# Patient Record
Sex: Male | Born: 1965
Health system: Southern US, Community
[De-identification: ages and names within clinical notes are randomized; demographics above are authoritative.]

## PROBLEM LIST (undated history)

## (undated) DIAGNOSIS — I10 Essential (primary) hypertension: Secondary | ICD-10-CM

## (undated) DIAGNOSIS — J45909 Unspecified asthma, uncomplicated: Secondary | ICD-10-CM

## (undated) HISTORY — PX: LACERATION REPAIR: SHX5168

---

## 2002-05-04 ENCOUNTER — Encounter: Payer: Self-pay | Admitting: Emergency Medicine

## 2002-05-04 ENCOUNTER — Emergency Department (HOSPITAL_COMMUNITY): Admission: EM | Admit: 2002-05-04 | Discharge: 2002-05-04 | Payer: Self-pay | Admitting: Emergency Medicine

## 2007-03-14 ENCOUNTER — Ambulatory Visit (HOSPITAL_COMMUNITY): Admission: RE | Admit: 2007-03-14 | Discharge: 2007-03-14 | Payer: Self-pay | Admitting: Family Medicine

## 2007-04-03 ENCOUNTER — Ambulatory Visit (HOSPITAL_COMMUNITY): Admission: RE | Admit: 2007-04-03 | Discharge: 2007-04-03 | Payer: Self-pay | Admitting: Family Medicine

## 2008-06-22 IMAGING — US US ABDOMEN COMPLETE
1 series · 14 of 25 positions shown · non-contrast
Comparison: none

HISTORY: Elevated LFTs

ULTRASOUND ABDOMEN COMPLETE:
TECHNIQUE: Complete abdominal ultrasound examination was performed including
evaluation of the liver, gallbladder, bile ducts, pancreas, kidneys, spleen,
IVC, and abdominal aorta.
Gallbladder normal without stones or wall thickening.
Common bile duct normal caliber, 3 mm diameter.
Liver, pancreas, and spleen normal appearance, spleen 6.6 cm length.
Kidneys normal appearance, right 10.4 cm length and left 10.6 cm.
Aorta and IVC normal.
No ascites.

[Series 1: us abdomen complete · 0.20mm/px · 14 of 78 slices shown]
[im 1/78]
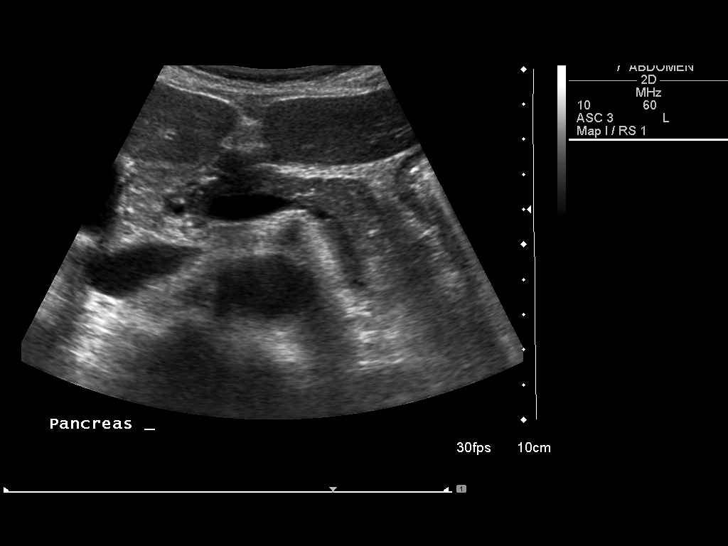
[im 7/78]
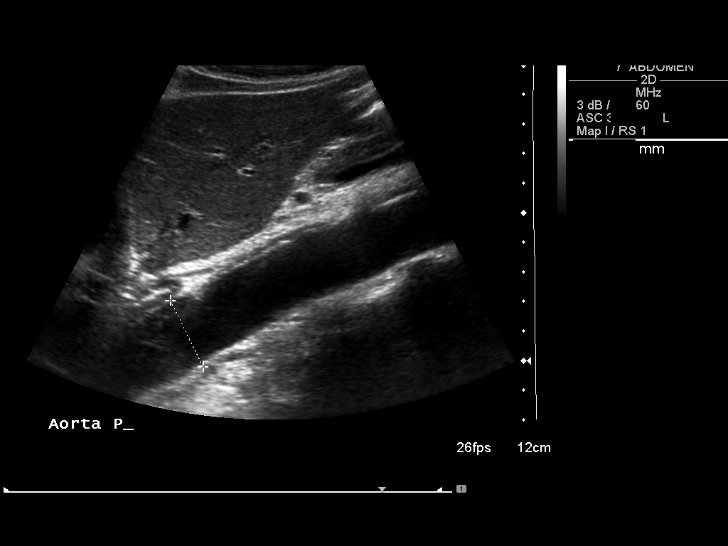
[im 13/78]
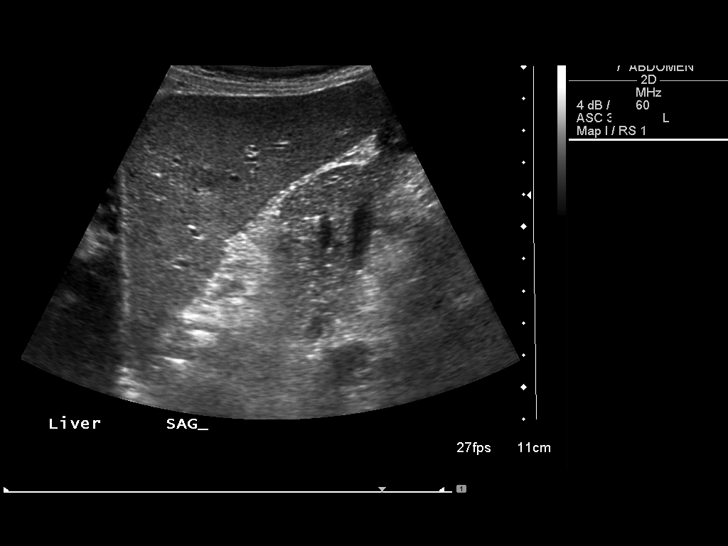
[im 20/78]
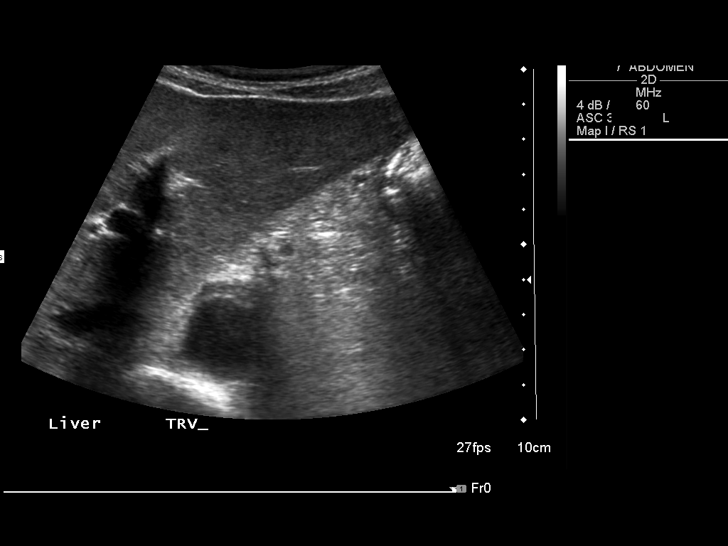
[im 26/78]
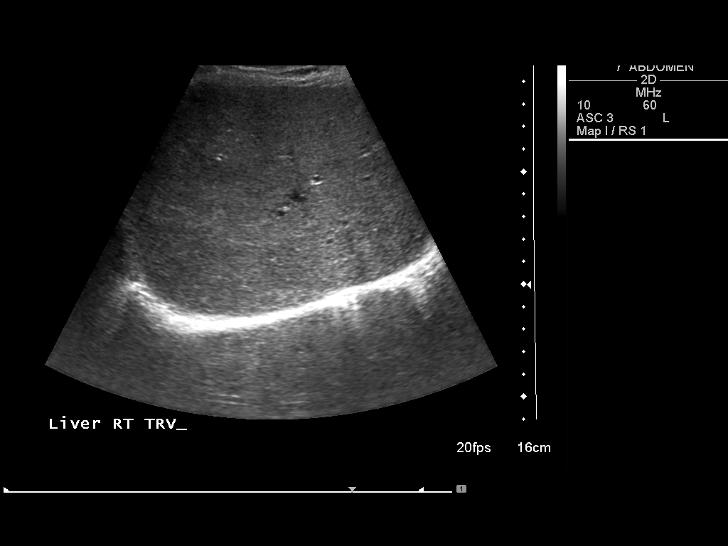
[im 29/78]
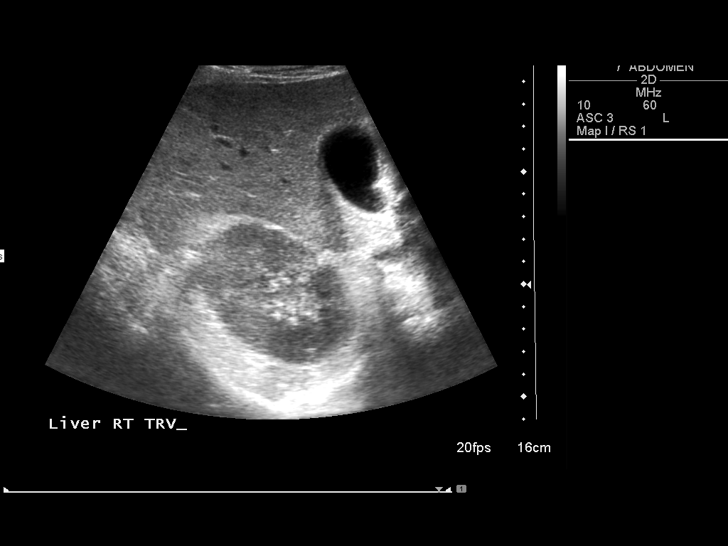
[im 36/78]
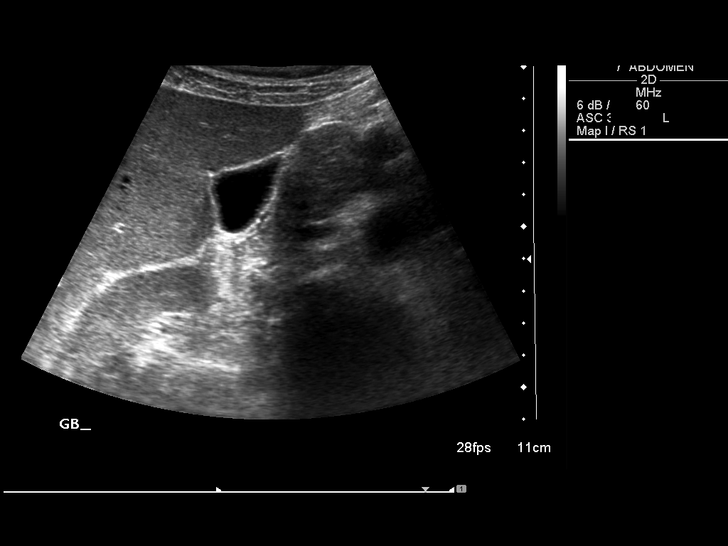
[im 42/78]
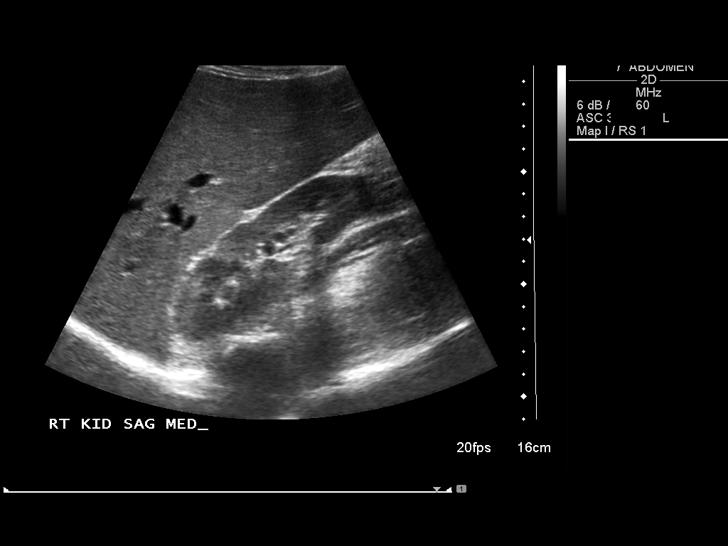
[im 49/78]
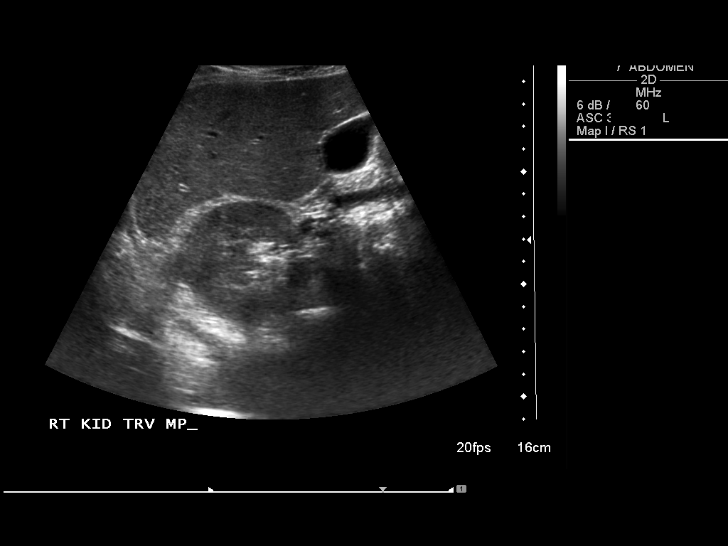
[im 52/78]
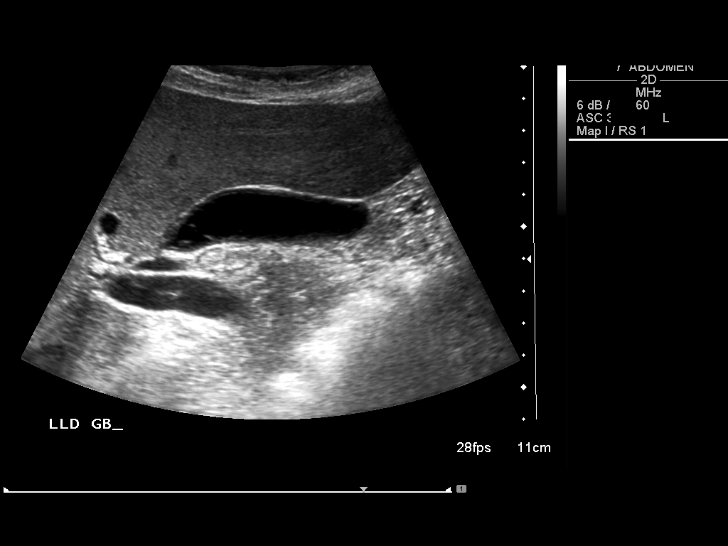
[im 58/78]
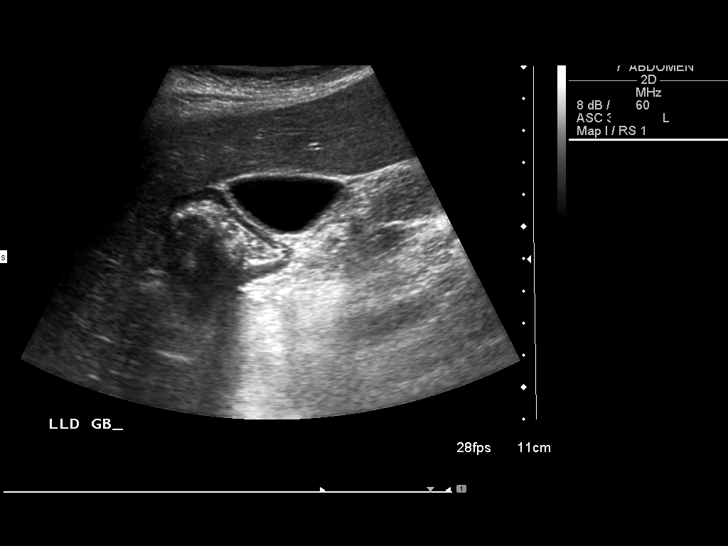
[im 65/78]
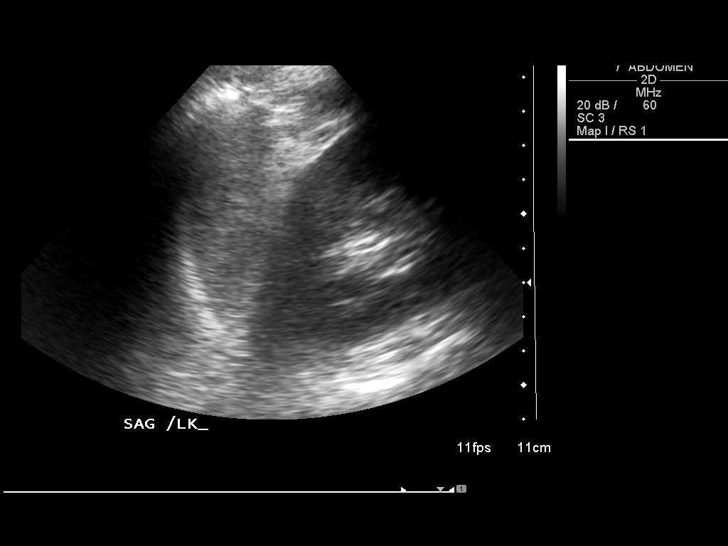
[im 71/78]
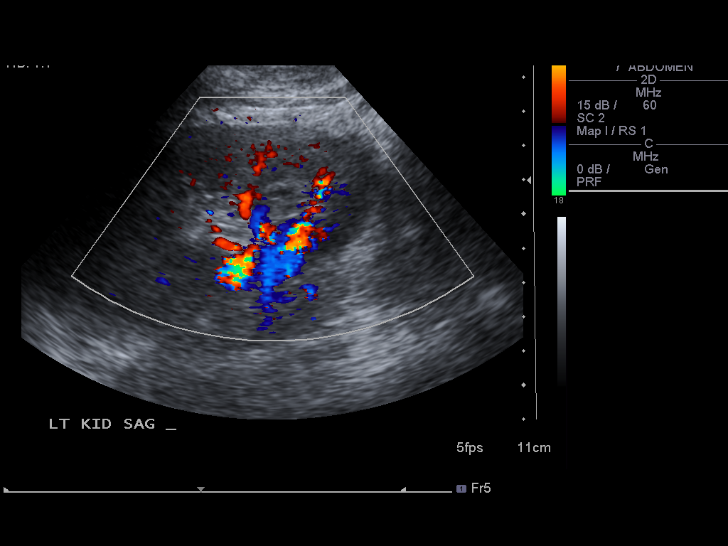
[im 78/78]
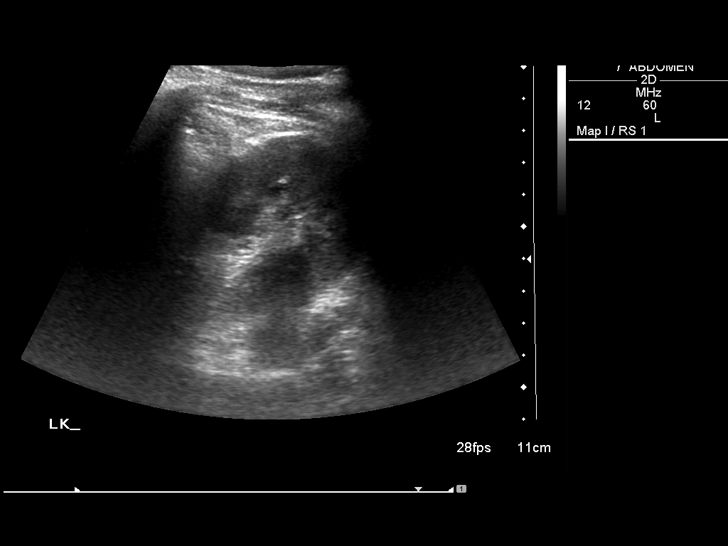

[14 of 25 positions shown; findings below may reference images not displayed]

IMPRESSION: Normal ultrasound abdomen.

## 2009-10-10 ENCOUNTER — Emergency Department (HOSPITAL_COMMUNITY): Admission: EM | Admit: 2009-10-10 | Discharge: 2009-10-10 | Payer: Self-pay | Admitting: Emergency Medicine

## 2014-09-13 ENCOUNTER — Emergency Department (HOSPITAL_COMMUNITY)
Admission: EM | Admit: 2014-09-13 | Discharge: 2014-09-13 | Disposition: A | Discharge status: Home / Self Care | Payer: Self-pay | Attending: Emergency Medicine | Admitting: Emergency Medicine

## 2014-09-13 ENCOUNTER — Encounter (HOSPITAL_COMMUNITY): Payer: Self-pay | Admitting: Emergency Medicine

## 2014-09-13 DIAGNOSIS — I889 Nonspecific lymphadenitis, unspecified: Secondary | ICD-10-CM | POA: Insufficient documentation

## 2014-09-13 DIAGNOSIS — K047 Periapical abscess without sinus: Secondary | ICD-10-CM | POA: Insufficient documentation

## 2014-09-13 DIAGNOSIS — Z720 Tobacco use: Secondary | ICD-10-CM | POA: Insufficient documentation

## 2014-09-13 DIAGNOSIS — R51 Headache: Secondary | ICD-10-CM | POA: Insufficient documentation

## 2014-09-13 DIAGNOSIS — H9209 Otalgia, unspecified ear: Secondary | ICD-10-CM | POA: Insufficient documentation

## 2014-09-13 MED ORDER — OXYCODONE-ACETAMINOPHEN 5-325 MG PO TABS
1.0000 | ORAL_TABLET | Freq: Once | ORAL | Status: AC
  Administered 2014-09-13: 1 via ORAL
  Filled 2014-09-13: qty 1

## 2014-09-13 MED ORDER — HYDROCODONE-ACETAMINOPHEN 5-325 MG PO TABS: 1.0000 | ORAL_TABLET | ORAL | Status: DC | PRN

## 2014-09-13 MED ORDER — AMOXICILLIN 500 MG PO CAPS: 500.0000 mg | ORAL_CAPSULE | Freq: Three times a day (TID) | ORAL | Status: DC

## 2014-09-13 MED ORDER — AMOXICILLIN 250 MG PO CAPS
500.0000 mg | ORAL_CAPSULE | Freq: Once | ORAL | Status: AC
  Administered 2014-09-13: 500 mg via ORAL
  Filled 2014-09-13: qty 2

## 2014-09-13 NOTE — ED Provider Notes (Signed)
CSN: [id number]     Arrival date & time 09/13/14  1652 History  This chart was scribed for non-physician practitioner Kerrie BuffaloHope Neese, NP, working with Benjiman CoreNathan Pickering, MD by Littie Deedsichard Sun, ED Scribe. This patient was seen in room APFT24/APFT24 and the patient's care was started at 5:53 PM.      Chief Complaint  Patient presents with  . Dental Pain   Patient is a 49 y.o. male presenting with tooth pain. The history is provided by the patient. No language interpreter was used.  Dental Pain Location:  Lower Lower teeth location:  19/LL 1st molar Quality:  Throbbing and constant Onset quality:  Gradual Duration:  3 days Progression:  Worsening Chronicity:  New Context: abscess   Relieved by:  Nothing Worsened by:  Cold food/drink, pressure and jaw movement Ineffective treatments:  Acetaminophen Associated symptoms: headaches   Risk factors: smoking    HPI Comments: Colton Nichols is a 49 y.o. male who presents to the Emergency Department complaining of gradual onset left lower dental pain in his 1st molar that started 3 days ago. Patient also reports having associated headache, ear pain, and pain when swallowing. He has been taking ibuprofen and Tylenol for the pain but without relief. He believes the tooth is abscessed and notes gum soreness around the tooth. NKDA.   History reviewed. No pertinent past medical history. History reviewed. No pertinent past surgical history. History reviewed. No pertinent family history. History  Substance Use Topics  . Smoking status: Current Every Day Smoker -- 0.50 packs/day    Types: Cigarettes  . Smokeless tobacco: Former NeurosurgeonUser  . Alcohol Use: Yes     Comment: 40 oz daily    Review of Systems  HENT: Positive for dental problem and ear pain.   Neurological: Positive for headaches.  all other systems negative    Allergies  Review of patient's allergies indicates no known allergies.  Home Medications   Prior to Admission medications    Medication Sig Start Date End Date Taking? Authorizing Provider  amoxicillin (AMOXIL) 500 MG capsule Take 1 capsule (500 mg total) by mouth 3 (three) times daily. 09/13/14   Hope Orlene OchM Neese, NP  HYDROcodone-acetaminophen (NORCO/VICODIN) 5-325 MG per tablet Take 1 tablet by mouth every 4 (four) hours as needed. 09/13/14   Hope Orlene OchM Neese, NP   BP 162/91 mmHg  Pulse 88  Temp(Src) 99 F (37.2 C) (Oral)  Resp 16  Ht 6\' 1"  (1.854 m)  Wt 155 lb (70.308 kg)  BMI 20.45 kg/m2  SpO2 99% Physical Exam  Constitutional: He is oriented to person, place, and time. He appears well-developed and well-nourished. No distress.  HENT:  Right Ear: Tympanic membrane normal.  Left Ear: Tympanic membrane normal.  Nose: Nose normal.  Mouth/Throat: Uvula is midline, oropharynx is clear and moist and mucous membranes are normal.    Gum surrounding the left lower first molar with erythema and swelling, tender on exam.   Eyes: Conjunctivae and EOM are normal.  Neck: Neck supple.  Lymphadenitis on the left side.  Cardiovascular: Normal rate.   Pulmonary/Chest: Effort normal.  Musculoskeletal: Normal range of motion.  Lymphadenopathy:    He has cervical adenopathy (left).  Neurological: He is alert and oriented to person, place, and time. No cranial nerve deficit.  Skin: Skin is warm and dry.  Psychiatric: He has a normal mood and affect. His behavior is normal.  Nursing note and vitals reviewed.   ED Course  Procedures  Percocet 5/325 mg PO,  Amoxicillin 500 mg PO DIAGNOSTIC STUDIES: Oxygen Saturation is 99% on room air, normal by my interpretation.    COORDINATION OF CARE: 5:56 PM-Discussed treatment plan which includes antibiotics and pain medication with patient/guardian at bedside and patient agreed to plan.     MDM  49 y.o. male with dental pain due to abscess. Will treat with antibiotics and pain medication and he will follow up with a dentist as soon as possible. Discussed with the patient and all  questioned fully answered. Stable for d/c without fever and does not appear toxic.   Final diagnoses:  Dental abscess   I personally performed the services described in this documentation, which was scribed in my presence. The recorded information has been reviewed and is accurate.   [place], NP 09/13/14 1840  Benjiman Core, MD 09/15/14 [phone]

## 2014-09-13 NOTE — ED Notes (Signed)
Pt reports L sided dental pain that began on Fri.

## 2014-09-13 NOTE — Discharge Instructions (Signed)
Continue to take the ibuprofen. Do not take the narcotic if driving as it will make you sleepy.  Follow up with a dentist as soon as possible.   Dental Abscess A dental abscess is a collection of infected fluid (pus) from a bacterial infection in the inner part of the tooth (pulp). It usually occurs at the end of the tooth's root.  CAUSES   Severe tooth decay.  Trauma to the tooth that allows bacteria to enter into the pulp, such as a broken or chipped tooth. SYMPTOMS   Severe pain in and around the infected tooth.  Swelling and redness around the abscessed tooth or in the mouth or face.  Tenderness.  Pus drainage.  Bad breath.  Bitter taste in the mouth.  Difficulty swallowing.  Difficulty opening the mouth.  Nausea.  Vomiting.  Chills.  Swollen neck glands. DIAGNOSIS   A medical and dental history will be taken.  An examination will be performed by tapping on the abscessed tooth.  X-rays may be taken of the tooth to identify the abscess. TREATMENT The goal of treatment is to eliminate the infection. You may be prescribed antibiotic medicine to stop the infection from spreading. A root canal may be performed to save the tooth. If the tooth cannot be saved, it may be pulled (extracted) and the abscess may be drained.  HOME CARE INSTRUCTIONS  Only take over-the-counter or prescription medicines for pain, fever, or discomfort as directed by your caregiver.  Rinse your mouth (gargle) often with salt water ( tsp salt in 8 oz [250 ml] of warm water) to relieve pain or swelling.  Do not drive after taking pain medicine (narcotics).  Do not apply heat to the outside of your face.  Return to your dentist for further treatment as directed. SEEK MEDICAL CARE IF:  Your pain is not helped by medicine.  Your pain is getting worse instead of better. SEEK IMMEDIATE MEDICAL CARE IF:  You have a fever or persistent symptoms for more than 2-3 days.  You have a fever and  your symptoms suddenly get worse.  You have chills or a very bad headache.  You have problems breathing or swallowing.  You have trouble opening your mouth.  You have swelling in the neck or around the eye. Document Released: 05/07/2005 Document Revised: 01/30/2012 Document Reviewed: 08/15/2010 [facility] Patient Information 2015 [place], [place]. This information is not intended to replace advice given to you by your health care provider. Make sure you discuss any questions you have with your health care provider.

## 2014-09-17 ENCOUNTER — Emergency Department (HOSPITAL_COMMUNITY)
Admission: EM | Admit: 2014-09-17 | Discharge: 2014-09-17 | Disposition: A | Discharge status: Home / Self Care | Payer: Self-pay | Attending: Emergency Medicine | Admitting: Emergency Medicine

## 2014-09-17 ENCOUNTER — Encounter (HOSPITAL_COMMUNITY): Payer: Self-pay | Admitting: *Deleted

## 2014-09-17 DIAGNOSIS — K029 Dental caries, unspecified: Secondary | ICD-10-CM | POA: Insufficient documentation

## 2014-09-17 DIAGNOSIS — Z720 Tobacco use: Secondary | ICD-10-CM | POA: Insufficient documentation

## 2014-09-17 DIAGNOSIS — K047 Periapical abscess without sinus: Secondary | ICD-10-CM | POA: Insufficient documentation

## 2014-09-17 MED ORDER — CLINDAMYCIN HCL 150 MG PO CAPS: 300.0000 mg | ORAL_CAPSULE | Freq: Four times a day (QID) | ORAL | Status: DC

## 2014-09-17 MED ORDER — HYDROCODONE-ACETAMINOPHEN 5-325 MG PO TABS: ORAL_TABLET | ORAL | Status: DC

## 2014-09-17 MED ORDER — NAPROXEN 500 MG PO TABS: 500.0000 mg | ORAL_TABLET | Freq: Two times a day (BID) | ORAL | Status: DC

## 2014-09-17 MED ORDER — OXYCODONE-ACETAMINOPHEN 5-325 MG PO TABS
1.0000 | ORAL_TABLET | Freq: Once | ORAL | Status: AC
  Administered 2014-09-17: 1 via ORAL
  Filled 2014-09-17: qty 1

## 2014-09-17 MED ORDER — BENZOCAINE (TOPICAL) 20 % EX AERO
INHALATION_SPRAY | Freq: Four times a day (QID) | CUTANEOUS | Status: DC | PRN
  Administered 2014-09-17: 13:00:00 via OROMUCOSAL
  Filled 2014-09-17: qty 57

## 2014-09-17 MED ORDER — CLINDAMYCIN HCL 150 MG PO CAPS
300.0000 mg | ORAL_CAPSULE | Freq: Once | ORAL | Status: AC
  Administered 2014-09-17: 300 mg via ORAL
  Filled 2014-09-17: qty 2

## 2014-09-17 NOTE — ED Provider Notes (Signed)
CSN: [id number]     Arrival date & time 09/17/14  1028 History   First MD Initiated Contact with Patient 09/17/14 1119     Chief Complaint  Patient presents with  . Oral Swelling     (Consider location/radiation/quality/duration/timing/severity/associated sxs/prior Treatment) HPI   Colton Nichols is a 49 y.o. male who presents to the Emergency Department complaining of dental pain and left lower facial swelling.  He was seen here 5 days ago for same and returns today reporting increased pain and swelling of his face.  He states that he has been taking amoxil and vicodin without relief.  He has been unable to see a dentist.  Pain is worse with chewing and cold liquids.  He denies fever, difficulty swallowing, breathing and inability to open/close his mouth.     History reviewed. No pertinent past medical history. History reviewed. No pertinent past surgical history. History reviewed. No pertinent family history. History  Substance Use Topics  . Smoking status: Current Every Day Smoker -- 0.50 packs/day    Types: Cigarettes  . Smokeless tobacco: Former NeurosurgeonUser  . Alcohol Use: Yes     Comment: 40 oz daily    Review of Systems  Constitutional: Negative for fever and appetite change.  HENT: Positive for dental problem and facial swelling. Negative for congestion, sore throat and trouble swallowing.   Eyes: Negative for pain and visual disturbance.  Gastrointestinal: Negative for nausea and vomiting.  Musculoskeletal: Negative for neck pain and neck stiffness.  Skin: Negative for rash.  Neurological: Negative for dizziness, facial asymmetry and headaches.  Hematological: Negative for adenopathy.  All other systems reviewed and are negative.     Allergies  Review of patient's allergies indicates no known allergies.  Home Medications   Prior to Admission medications   Medication Sig Start Date End Date Taking? Authorizing Provider  amoxicillin (AMOXIL) 500 MG capsule Take 1  capsule (500 mg total) by mouth 3 (three) times daily. 09/13/14  Yes Hope Orlene OchM Neese, NP  HYDROcodone-acetaminophen (NORCO/VICODIN) 5-325 MG per tablet Take 1 tablet by mouth every 4 (four) hours as needed. 09/13/14  Yes Hope Orlene OchM Neese, NP   BP 140/87 mmHg  Pulse 75  Temp(Src) 98.3 F (36.8 C) (Oral)  Resp 19  Ht 6\' 1"  (1.854 m)  Wt 135 lb (61.236 kg)  BMI 17.82 kg/m2  SpO2 100% Physical Exam  Constitutional: He is oriented to person, place, and time. He appears well-developed and well-nourished. No distress.  HENT:  Head: Normocephalic and atraumatic.  Right Ear: Tympanic membrane and ear canal normal.  Left Ear: Tympanic membrane and ear canal normal.  Mouth/Throat: Uvula is midline, oropharynx is clear and moist and mucous membranes are normal. No trismus in the jaw. Dental caries present. No dental abscesses or uvula swelling.  Tenderness and dental caries of the left lower second premolar and first molar.  Fluctuance noted to the adjacent gums. Localized facial swelling, obvious dental abscess, no trismus, or sublingual abnml.    Neck: Normal range of motion. Neck supple.  Cardiovascular: Normal rate, regular rhythm and normal heart sounds.   No murmur heard. Pulmonary/Chest: Effort normal and breath sounds normal. No respiratory distress.  Musculoskeletal: Normal range of motion.  Lymphadenopathy:    He has no cervical adenopathy.  Neurological: He is alert and oriented to person, place, and time. He exhibits normal muscle tone. Coordination normal.  Skin: Skin is warm and dry.  Nursing note and vitals reviewed.   ED Course  Procedures (  including critical care time) Labs Review Labs Reviewed - No data to display  Imaging Review No results found.   EKG Interpretation None       INCISION AND DRAINAGE Performed by: Maxwell Caul. Consent: Verbal consent obtained. Risks and benefits: risks, benefits and alternatives were discussed Type: abscess  Body area: left lower  gums Anesthesia: topical  infiltration  Incision was made with a #11 scalpel.  Local anesthetic: topical benzocaine spray   Complexity: simple Blunt dissection to break up loculations  Drainage: purulent  Drainage amount: large Packing material: none  Patient tolerance: Patient tolerated the procedure well with no immediate complications.    MDM   Final diagnoses:  Dental abscess    Pt is well appearing, non-toxic.  Vitals stable.  Feeling better after I&D of abscess.  Will d/c the amoxil and Rx for clindamycin.  Pt agrees to arrange dental f/u next week.     Pauline Aus, PA-C 09/19/14 1610  Gilda Crease, MD 09/21/14 [phone]

## 2014-09-17 NOTE — Discharge Instructions (Signed)

## 2014-09-17 NOTE — ED Notes (Signed)
Pt returning for oral swelling, lt lower jaw, pt has been treated with antibiotics, swelling worse and moving down his neck. Pt was seen 5 days ago.

## 2015-07-03 ENCOUNTER — Encounter (HOSPITAL_COMMUNITY): Payer: Self-pay | Admitting: Emergency Medicine

## 2015-07-03 ENCOUNTER — Emergency Department (HOSPITAL_COMMUNITY)
Admission: EM | Admit: 2015-07-03 | Discharge: 2015-07-03 | Disposition: A | Discharge status: Home / Self Care | Payer: Self-pay | Attending: Emergency Medicine | Admitting: Emergency Medicine

## 2015-07-03 ENCOUNTER — Emergency Department (HOSPITAL_COMMUNITY): Payer: Self-pay

## 2015-07-03 DIAGNOSIS — F17210 Nicotine dependence, cigarettes, uncomplicated: Secondary | ICD-10-CM | POA: Insufficient documentation

## 2015-07-03 DIAGNOSIS — M5441 Lumbago with sciatica, right side: Secondary | ICD-10-CM

## 2015-07-03 DIAGNOSIS — Z791 Long term (current) use of non-steroidal anti-inflammatories (NSAID): Secondary | ICD-10-CM | POA: Insufficient documentation

## 2015-07-03 DIAGNOSIS — Y9389 Activity, other specified: Secondary | ICD-10-CM | POA: Insufficient documentation

## 2015-07-03 DIAGNOSIS — S3992XA Unspecified injury of lower back, initial encounter: Secondary | ICD-10-CM | POA: Insufficient documentation

## 2015-07-03 DIAGNOSIS — Z792 Long term (current) use of antibiotics: Secondary | ICD-10-CM | POA: Insufficient documentation

## 2015-07-03 DIAGNOSIS — W010XXA Fall on same level from slipping, tripping and stumbling without subsequent striking against object, initial encounter: Secondary | ICD-10-CM | POA: Insufficient documentation

## 2015-07-03 DIAGNOSIS — M5442 Lumbago with sciatica, left side: Secondary | ICD-10-CM

## 2015-07-03 DIAGNOSIS — M5440 Lumbago with sciatica, unspecified side: Secondary | ICD-10-CM | POA: Insufficient documentation

## 2015-07-03 DIAGNOSIS — Y9289 Other specified places as the place of occurrence of the external cause: Secondary | ICD-10-CM | POA: Insufficient documentation

## 2015-07-03 DIAGNOSIS — Y998 Other external cause status: Secondary | ICD-10-CM | POA: Insufficient documentation

## 2015-07-03 MED ORDER — OXYCODONE-ACETAMINOPHEN 5-325 MG PO TABS
1.0000 | ORAL_TABLET | Freq: Once | ORAL | Status: AC
  Administered 2015-07-03: 1 via ORAL
  Filled 2015-07-03: qty 1

## 2015-07-03 MED ORDER — NAPROXEN 500 MG PO TABS: 500.0000 mg | ORAL_TABLET | Freq: Two times a day (BID) | ORAL | Status: DC

## 2015-07-03 MED ORDER — CYCLOBENZAPRINE HCL 10 MG PO TABS
10.0000 mg | ORAL_TABLET | Freq: Once | ORAL | Status: AC
  Administered 2015-07-03: 10 mg via ORAL
  Filled 2015-07-03: qty 1

## 2015-07-03 MED ORDER — HYDROCODONE-ACETAMINOPHEN 5-325 MG PO TABS: ORAL_TABLET | ORAL | Status: DC

## 2015-07-03 NOTE — ED Notes (Signed)
Patient c/o low back and buttock pain. Per patient started Friday after "lifting box in tight spot and twisting the wrong way causing pain." Per patient pain caused him to drop the box and back up into metal self hitting his buttock on shelving.

## 2015-07-03 NOTE — ED Provider Notes (Signed)
CSN: [id number]     Arrival date & time 07/03/15  1454 History   First MD Initiated Contact with Patient 07/03/15 1559     Chief Complaint  Patient presents with  . Back Pain     (Consider location/radiation/quality/duration/timing/severity/associated sxs/prior Treatment) HPI   Colton Nichols is a 50 y.o. male who presents to the Emergency Department complaining of sudden onset of low back pain since yesterday.  He states that he was lifting a box and turned to set the box down  And felt a sharp, sudden pain to his lower back which caused him to fall landing on his buttocks.  He complains of worsening pain to his back with bending sitting and walking and pain radiating into both upper legs.  He took one of his significant other's vicodin last evening with some relief.  He denies head injury, neck pain, urine or bowel changes, numbness or weakness of the LE's.   History reviewed. No pertinent past medical history. History reviewed. No pertinent past surgical history. History reviewed. No pertinent family history. Social History  Substance Use Topics  . Smoking status: Current Every Day Smoker -- 0.50 packs/day    Types: Cigarettes  . Smokeless tobacco: Never Used  . Alcohol Use: Yes     Comment: occasional    Review of Systems  Constitutional: Negative for fever.  Respiratory: Negative for shortness of breath.   Gastrointestinal: Negative for vomiting, abdominal pain, diarrhea and constipation.  Genitourinary: Negative for dysuria, hematuria, flank pain, decreased urine volume and difficulty urinating.  Musculoskeletal: Positive for back pain (lower back and tailbone pain). Negative for joint swelling and neck pain.  Skin: Negative for rash.  Neurological: Negative for syncope, weakness and numbness.  All other systems reviewed and are negative.     Allergies  Review of patient's allergies indicates no known allergies.  Home Medications   Prior to Admission medications    Medication Sig Start Date End Date Taking? Authorizing Provider  amoxicillin (AMOXIL) 500 MG capsule Take 1 capsule (500 mg total) by mouth 3 (three) times daily. 09/13/14   Hope Orlene Och, NP  clindamycin (CLEOCIN) 150 MG capsule Take 2 capsules (300 mg total) by mouth 4 (four) times daily. For 7 days 09/17/14   Pauline Aus, PA-C  HYDROcodone-acetaminophen (NORCO/VICODIN) 5-325 MG per tablet Take one-two tabs po q 4-6 hrs prn pain 09/17/14   Kingstin Heims, PA-C  naproxen (NAPROSYN) 500 MG tablet Take 1 tablet (500 mg total) by mouth 2 (two) times daily. 09/17/14   Ceniya Fowers, PA-C   BP 116/74 mmHg  Pulse 109  Temp(Src) 98.8 F (37.1 C) (Oral)  Resp 16  Ht  (1.854 m)  SpO2 100% Physical Exam  Constitutional: He is oriented to person, place, and time. He appears well-developed and well-nourished. No distress.  HENT:  Head: Normocephalic and atraumatic.  Neck: Normal range of motion. Neck supple.  Cardiovascular: Normal rate, regular rhythm, normal heart sounds and intact distal pulses.   No murmur heard. Pulmonary/Chest: Effort normal and breath sounds normal. No respiratory distress.  Abdominal: Soft. He exhibits no distension. There is no tenderness.  Musculoskeletal: He exhibits tenderness. He exhibits no edema.       Lumbar back: He exhibits tenderness and pain. He exhibits normal range of motion, no swelling, no deformity, no laceration and normal pulse.  ttp of the lumbar spine and bilateral lumbar paraspinal muscles. Mild tenderness of the coccyx. No bony deformity.  DP pulses are brisk and symmetrical.  Distal sensation intact.  Pt has 5/5 strength against resistance of bilateral lower extremities.     Neurological: He is alert and oriented to person, place, and time. He has normal strength. No sensory deficit. He exhibits normal muscle tone. Coordination and gait normal.  Reflex Scores:      Patellar reflexes are 2+ on the right side and 2+ on the left side.       Achilles reflexes are 2+ on the right side and 2+ on the left side. Skin: Skin is warm and dry. No rash noted.  Psychiatric: He has a normal mood and affect.  Nursing note and vitals reviewed.   ED Course  Procedures (including critical care time) Labs Review Labs Reviewed - No data to display  Imaging Review Dg Lumbar Spine Complete  07/03/2015  CLINICAL DATA:  Low back pain after lifting injury on Friday. EXAM: LUMBAR SPINE - COMPLETE 4+ VIEW COMPARISON:  None. FINDINGS: There is no evidence of lumbar spine fracture. Alignment is normal. Intervertebral disc spaces are maintained. Sacrum appears intact and normal in mineralization. No evidence of pars interarticularis defect. Atherosclerotic calcifications are seen along the walls of the infrarenal abdominal aorta. Paravertebral soft tissues are otherwise unremarkable. IMPRESSION: No acute findings.  Atherosclerotic changes of the abdominal aorta. Electronically Signed   By: Bary Richard M.D.   On: 07/03/2015 17:06   I have personally reviewed and evaluated these images and lab results as part of my medical decision-making.   EKG Interpretation None      MDM   Final diagnoses:  Bilateral low back pain with sciatica, sciatica laterality unspecified    Pt ambulates with a slightly antalgic gait.  No focal neuro deficits.  No concerning sx's for emergent neurological process.  Appears stable for d/c advised to return for worsening sx's    Pauline Aus, PA-C 07/03/15 1724  Raeford Razor, MD 07/05/15 1344

## 2015-10-05 ENCOUNTER — Encounter (HOSPITAL_COMMUNITY): Payer: Self-pay

## 2015-10-05 ENCOUNTER — Emergency Department (HOSPITAL_COMMUNITY)
Admission: EM | Admit: 2015-10-05 | Discharge: 2015-10-05 | Disposition: A | Discharge status: Home / Self Care | Payer: Self-pay | Attending: Emergency Medicine | Admitting: Emergency Medicine

## 2015-10-05 DIAGNOSIS — K029 Dental caries, unspecified: Secondary | ICD-10-CM | POA: Insufficient documentation

## 2015-10-05 DIAGNOSIS — F17210 Nicotine dependence, cigarettes, uncomplicated: Secondary | ICD-10-CM | POA: Insufficient documentation

## 2015-10-05 MED ORDER — IBUPROFEN 800 MG PO TABS
800.0000 mg | ORAL_TABLET | Freq: Once | ORAL | Status: AC
  Administered 2015-10-05: 800 mg via ORAL
  Filled 2015-10-05: qty 1

## 2015-10-05 MED ORDER — CLINDAMYCIN HCL 150 MG PO CAPS
300.0000 mg | ORAL_CAPSULE | Freq: Once | ORAL | Status: AC
  Administered 2015-10-05: 300 mg via ORAL
  Filled 2015-10-05: qty 2

## 2015-10-05 MED ORDER — CLINDAMYCIN HCL 150 MG PO CAPS: 150.0000 mg | ORAL_CAPSULE | Freq: Four times a day (QID) | ORAL | Status: DC

## 2015-10-05 MED ORDER — TRAMADOL HCL 50 MG PO TABS
100.0000 mg | ORAL_TABLET | Freq: Once | ORAL | Status: AC
  Administered 2015-10-05: 100 mg via ORAL
  Filled 2015-10-05: qty 2

## 2015-10-05 MED ORDER — TRAMADOL HCL 50 MG PO TABS: 50.0000 mg | ORAL_TABLET | Freq: Four times a day (QID) | ORAL | Status: DC | PRN

## 2015-10-05 MED ORDER — PROMETHAZINE HCL 12.5 MG PO TABS
25.0000 mg | ORAL_TABLET | Freq: Once | ORAL | Status: AC
  Administered 2015-10-05: 25 mg via ORAL
  Filled 2015-10-05: qty 2

## 2015-10-05 NOTE — Discharge Instructions (Signed)
Dental Caries °Dental caries is tooth decay. This decay can cause a hole in teeth (cavity) that can get bigger and deeper over time. °HOME CARE °· Brush and floss your teeth. Do this at least two times a day. °· Use a fluoride toothpaste. °· Use a mouth rinse if told by your dentist or doctor. °· Eat less sugary and starchy foods. Drink less sugary drinks. °· Avoid snacking often on sugary and starchy foods. Avoid sipping often on sugary drinks. °· Keep regular checkups and cleanings with your dentist. °· Use fluoride supplements if told by your dentist or doctor. °· Allow fluoride to be applied to teeth if told by your dentist or doctor. °  °This information is not intended to replace advice given to you by your health care provider. Make sure you discuss any questions you have with your health care provider. °  °Document Released: 02/14/2008 Document Revised: 05/28/2014 Document Reviewed: 05/09/2012 °Elsevier Interactive Patient Education ©2016 [employer] ° °

## 2015-10-05 NOTE — ED Notes (Signed)
PA at bedside.

## 2015-10-05 NOTE — ED Notes (Signed)
Left lower dental pain with swelling X1 day

## 2015-10-05 NOTE — ED Provider Notes (Signed)
CSN: [id number]     Arrival date & time 10/05/15  1122 History   First MD Initiated Contact with Patient 10/05/15 1205     Chief Complaint  Patient presents with  . Dental Pain     (Consider location/radiation/quality/duration/timing/severity/associated sxs/prior Treatment) Patient is a 50 y.o. male presenting with tooth pain. The history is provided by the patient.  Dental Pain Location:  Lower Quality:  Aching and shooting Severity:  Moderate Onset quality:  Gradual Duration:  1 day Timing:  Intermittent Progression:  Worsening Chronicity: acute on chronic. Context: dental caries and poor dentition   Relieved by:  Nothing Worsened by:  Cold food/drink Ineffective treatments:  Acetaminophen Associated symptoms: facial swelling and gum swelling   Associated symptoms: no drooling and no fever   Risk factors: lack of dental care and smoking   Risk factors: no diabetes and no immunosuppression     History reviewed. No pertinent past medical history. History reviewed. No pertinent past surgical history. History reviewed. No pertinent family history. Social History  Substance Use Topics  . Smoking status: Current Every Day Smoker -- 0.50 packs/day    Types: Cigarettes  . Smokeless tobacco: Never Used  . Alcohol Use: Yes     Comment: occasional    Review of Systems  Constitutional: Negative for fever.  HENT: Positive for dental problem and facial swelling. Negative for drooling.   All other systems reviewed and are negative.     Allergies  Review of patient's allergies indicates no known allergies.  Home Medications   Prior to Admission medications   Medication Sig Start Date End Date Taking? Authorizing Provider  amoxicillin (AMOXIL) 500 MG capsule Take 1 capsule (500 mg total) by mouth 3 (three) times daily. 09/13/14   Hope Orlene Och, NP  clindamycin (CLEOCIN) 150 MG capsule Take 1 capsule (150 mg total) by mouth every 6 (six) hours. 10/05/15   Ivery Quale, PA-C   HYDROcodone-acetaminophen (NORCO/VICODIN) 5-325 MG tablet Take one-two tabs po q 4-6 hrs prn pain 07/03/15   Tammy Triplett, PA-C  naproxen (NAPROSYN) 500 MG tablet Take 1 tablet (500 mg total) by mouth 2 (two) times daily with a meal. 07/03/15   Tammy Triplett, PA-C  traMADol (ULTRAM) 50 MG tablet Take 1 tablet (50 mg total) by mouth every 6 (six) hours as needed. 10/05/15   Ivery Quale, PA-C   BP 145/99 mmHg  Pulse 84  Temp(Src) 99 F (37.2 C) (Oral)  Resp 18  Ht  (1.854 m)  Wt 63.504 kg  BMI 18.47 kg/m2  SpO2 100% Physical Exam  Constitutional: He is oriented to person, place, and time. He appears well-developed and well-nourished.  Non-toxic appearance.  HENT:  Head: Normocephalic.  Right Ear: Tympanic membrane and external ear normal.  Left Ear: Tympanic membrane and external ear normal.  Multiple dental caries present. There is swelling of the gum of the left lower jaw. There is swelling along the angle of the left lower jaw. The airway is patent. There is no swelling under the tongue.  Eyes: EOM and lids are normal. Pupils are equal, round, and reactive to light.  Neck: Normal range of motion. Neck supple. Carotid bruit is not present.  Cardiovascular: Normal rate, regular rhythm, normal heart sounds, intact distal pulses and normal pulses.   Pulmonary/Chest: Breath sounds normal. No respiratory distress.  Abdominal: Soft. Bowel sounds are normal. There is no tenderness. There is no guarding.  Musculoskeletal: Normal range of motion.  Lymphadenopathy:  Head (right side): No submandibular adenopathy present.       Head (left side): No submandibular adenopathy present.    He has no cervical adenopathy.  Neurological: He is alert and oriented to person, place, and time. He has normal strength. No cranial nerve deficit or sensory deficit.  Skin: Skin is warm and dry.  Psychiatric: He has a normal mood and affect. His speech is normal.  Nursing note and vitals  reviewed.   ED Course  Procedures (including critical care time) Labs Review Labs Reviewed - No data to display  Imaging Review No results found. I have personally reviewed and evaluated these images and lab results as part of my medical decision-making.   EKG Interpretation None      MDM  Patient has multiple dental caries, but swelling along the left lower jaw and gum. He has a deep cavity of the lower molar on the left. Vital signs reviewed. I discussed with the patient the importance of seeing a dentist as sone as possible. The patient will be treated with clindamycin, ibuprofen, and Ultram. Dental resources were provided for the patient.    Final diagnoses:  Dental caries    *I have reviewed nursing notes, vital signs, and all appropriate lab and imaging results for this patient.Ivery Quale*    Russie Gulledge, PA-C 10/05/15 1243  Lavera Guiseana Duo Liu, MD 10/05/15 [phone]

## 2015-10-11 ENCOUNTER — Emergency Department (HOSPITAL_COMMUNITY)
Admission: EM | Admit: 2015-10-11 | Discharge: 2015-10-12 | Disposition: A | Discharge status: Home / Self Care | Payer: Self-pay | Attending: Emergency Medicine | Admitting: Emergency Medicine

## 2015-10-11 ENCOUNTER — Emergency Department (HOSPITAL_COMMUNITY): Payer: Self-pay

## 2015-10-11 ENCOUNTER — Encounter (HOSPITAL_COMMUNITY): Payer: Self-pay | Admitting: *Deleted

## 2015-10-11 DIAGNOSIS — F17210 Nicotine dependence, cigarettes, uncomplicated: Secondary | ICD-10-CM | POA: Insufficient documentation

## 2015-10-11 DIAGNOSIS — K047 Periapical abscess without sinus: Secondary | ICD-10-CM | POA: Insufficient documentation

## 2015-10-11 LAB — BASIC METABOLIC PANEL
Anion gap: 8 (ref 5–15)
BUN: 14 mg/dL (ref 6–20)
CO2: 26 mmol/L (ref 22–32)
Calcium: 9.7 mg/dL (ref 8.9–10.3)
Chloride: 101 mmol/L (ref 101–111)
Creatinine, Ser: 0.87 mg/dL (ref 0.61–1.24)
GFR calc Af Amer: >60 mL/min (ref >60)
GFR calc non Af Amer: >60 mL/min (ref >60)
Glucose, Bld: 105 mg/dL — ABNORMAL HIGH (ref 65–99)
Potassium: 4.1 mmol/L (ref 3.5–5.1)
Sodium: 135 mmol/L (ref 135–145)

## 2015-10-11 MED ORDER — LIDOCAINE HCL (PF) 1 % IJ SOLN
INTRAMUSCULAR | Status: AC
  Administered 2015-10-11: 5 mL
  Filled 2015-10-11: qty 5

## 2015-10-11 MED ORDER — PROMETHAZINE HCL 12.5 MG PO TABS
12.5000 mg | ORAL_TABLET | Freq: Once | ORAL | Status: AC
  Administered 2015-10-11: 12.5 mg via ORAL
  Filled 2015-10-11: qty 1

## 2015-10-11 MED ORDER — CEFTRIAXONE SODIUM 1 G IJ SOLR
1.0000 g | Freq: Once | INTRAMUSCULAR | Status: AC
  Administered 2015-10-11: 1 g via INTRAMUSCULAR
  Filled 2015-10-11: qty 10

## 2015-10-11 MED ORDER — TRAMADOL HCL 50 MG PO TABS
100.0000 mg | ORAL_TABLET | Freq: Once | ORAL | Status: AC
  Administered 2015-10-11: 100 mg via ORAL
  Filled 2015-10-11: qty 2

## 2015-10-11 MED ORDER — IBUPROFEN 800 MG PO TABS
800.0000 mg | ORAL_TABLET | Freq: Once | ORAL | Status: AC
Start: 2015-10-11 — End: 2015-10-11
  Administered 2015-10-11: 800 mg via ORAL
  Filled 2015-10-11: qty 1

## 2015-10-11 NOTE — ED Notes (Signed)
Pt has large hardened mass to lower left jaw. States it began several weeks ago, was prescribed antibx and pain medication. Reports finishing antibx and unable to see dentist until first of next month.

## 2015-10-11 NOTE — ED Notes (Signed)
Pt c/o abscess to left lower jaw; pt states the place is painful and hard and he was unable to sleep today

## 2015-10-11 NOTE — ED Provider Notes (Signed)
CSN: [id number]     Arrival date & time 10/11/15  1932 History   First MD Initiated Contact with Patient 10/11/15 2035     Chief Complaint  Patient presents with  . Abscess     (Consider location/radiation/quality/duration/timing/severity/associated sxs/prior Treatment) HPI Comments: Patient is a 50 year old male who presents to the emergency department with a complaint of pain of the left jaw.    Patient is a 50 y.o. male presenting with tooth pain. The history is provided by the patient.  Dental Pain Location:  Lower Quality:  Aching and throbbing Severity:  Moderate Onset quality:  Gradual Duration: several weeks. Timing:  Intermittent Progression:  Worsening Context: dental caries and poor dentition   Context: not trauma   Relieved by:  Nothing Worsened by:  Nothing tried Ineffective treatments:  Topical anesthetic gel Associated symptoms: facial pain, facial swelling and gum swelling   Associated symptoms: no trismus   Risk factors: lack of dental care   Risk factors: no diabetes and no immunosuppression     History reviewed. No pertinent past medical history. History reviewed. No pertinent past surgical history. History reviewed. No pertinent family history. Social History  Substance Use Topics  . Smoking status: Current Every Day Smoker -- 0.50 packs/day    Types: Cigarettes  . Smokeless tobacco: Never Used  . Alcohol Use: Yes     Comment: occasional    Review of Systems  HENT: Positive for dental problem and facial swelling.   All other systems reviewed and are negative.     Allergies  Review of patient's allergies indicates no known allergies.  Home Medications   Prior to Admission medications   Medication Sig Start Date End Date Taking? Authorizing Provider  amoxicillin (AMOXIL) 500 MG capsule Take 1 capsule (500 mg total) by mouth 3 (three) times daily. 09/13/14   Hope Orlene Och, NP  clindamycin (CLEOCIN) 150 MG capsule Take 1 capsule (150 mg  total) by mouth every 6 (six) hours. 10/05/15   Ivery Quale, PA-C  HYDROcodone-acetaminophen (NORCO/VICODIN) 5-325 MG tablet Take one-two tabs po q 4-6 hrs prn pain 07/03/15   Tammy Triplett, PA-C  naproxen (NAPROSYN) 500 MG tablet Take 1 tablet (500 mg total) by mouth 2 (two) times daily with a meal. 07/03/15   Tammy Triplett, PA-C  traMADol (ULTRAM) 50 MG tablet Take 1 tablet (50 mg total) by mouth every 6 (six) hours as needed. 10/05/15   Ivery Quale, PA-C   BP 148/83 mmHg  Pulse 97  Temp(Src) 99.3 F (37.4 C) (Oral)  Resp 20  Ht  (1.854 m)  Wt 65.772 kg  BMI 19.13 kg/m2  SpO2 100% Physical Exam  Constitutional: He is oriented to person, place, and time. He appears well-developed and well-nourished.  Non-toxic appearance.  HENT:  Head: Normocephalic.  Right Ear: Tympanic membrane and external ear normal.  Left Ear: Tympanic membrane and external ear normal.  There is swelling of the left lower jaw and gum at the level of the molars. There is mild to mod facial swelling of the left lower jaw. No swelling under the tongue, and the airway is patent.  Eyes: EOM and lids are normal. Pupils are equal, round, and reactive to light.  Neck: Normal range of motion. Neck supple. Carotid bruit is not present.  Cardiovascular: Normal rate, regular rhythm, normal heart sounds, intact distal pulses and normal pulses.   Pulmonary/Chest: Breath sounds normal. No respiratory distress.  Abdominal: Soft. Bowel sounds are normal. There is no tenderness. There  is no guarding.  Musculoskeletal: Normal range of motion.  Lymphadenopathy:       Head (right side): No submandibular adenopathy present.       Head (left side): No submandibular adenopathy present.    He has no cervical adenopathy.  Neurological: He is alert and oriented to person, place, and time. He has normal strength. No cranial nerve deficit or sensory deficit.  Skin: Skin is warm and dry.  Psychiatric: He has a normal mood and affect.  His speech is normal.  Nursing note and vitals reviewed.   ED Course  Procedures (including critical care time) Labs Review Labs Reviewed - No data to display  Imaging Review No results found. I have personally reviewed and evaluated these images and lab results as part of my medical decision-making.   EKG Interpretation None      MDM  CT reveals soft tissue inflammation along the left mandible. There is an abscess near a large dental cary in the soft tissue. Multiple other dental caries also noted. I advised the patient of the abscess and the dangers of not getting this addressed by a dentist. Pt treated with IM Rocephin in the ED. Rx for amoxil and ultram given to the patient. Discussed need to return to the ED if any high fevers, or signs of advancing infection.   Final diagnoses:  None    *I have reviewed nursing notes, vital signs, and all appropriate lab and imaging results for this patient.[address]    Ilian Wessell, PA-C 10/14/15 1132  Pricilla LovelessScott Goldston, MD 10/18/15 [phone]

## 2015-10-12 MED ORDER — AMOXICILLIN 500 MG PO CAPS: 500.0000 mg | ORAL_CAPSULE | Freq: Three times a day (TID) | ORAL | Status: DC

## 2015-10-12 MED ORDER — IOPAMIDOL (ISOVUE-300) INJECTION 61%
75.0000 mL | Freq: Once | INTRAVENOUS | Status: AC | PRN
  Administered 2015-10-12: 75 mL via INTRAVENOUS

## 2015-10-12 MED ORDER — TRAMADOL HCL 50 MG PO TABS: 50.0000 mg | ORAL_TABLET | Freq: Four times a day (QID) | ORAL | Status: DC | PRN

## 2015-10-12 NOTE — Discharge Instructions (Signed)
You have an abscess ear one of the molars on your left side. Your temperature today is 99.3. It is extremely important that you're seen by dentist is seen as possible. Please use Amoxil  and 600 mg of ibuprofen 3 times daily with food. May use Ultram for more severe pain. This medication may cause drowsiness, please use with caution. Dental Abscess A dental abscess is a collection of pus in or around a tooth. CAUSES This condition is caused by a bacterial infection around the root of the tooth that involves the inner part of the tooth (pulp). It may result from:  Severe tooth decay.  Trauma to the tooth that allows bacteria to enter into the pulp, such as a broken or chipped tooth.  Severe gum disease around a tooth. SYMPTOMS Symptoms of this condition include:  Severe pain in and around the infected tooth.  Swelling and redness around the infected tooth, in the mouth, or in the face.  Tenderness.  Pus drainage.  Bad breath.  Bitter taste in the mouth.  Difficulty swallowing.  Difficulty opening the mouth.  Nausea.  Vomiting.  Chills.  Swollen neck glands.  Fever. DIAGNOSIS This condition is diagnosed with examination of the infected tooth. During the exam, your dentist may tap on the infected tooth. Your dentist will also ask about your medical and dental history and may order X-rays. TREATMENT This condition is treated by eliminating the infection. This may be done with:  Antibiotic medicine.  A root canal. This may be performed to save the tooth.  Pulling (extracting) the tooth. This may also involve draining the abscess. This is done if the tooth cannot be saved. HOME CARE INSTRUCTIONS  Take medicines only as directed by your dentist.  If you were prescribed antibiotic medicine, finish all of it even if you start to feel better.  Rinse your mouth (gargle) often with salt water to relieve pain or swelling.  Do not drive or operate heavy machinery while  taking pain medicine.  Do not apply heat to the outside of your mouth.  Keep all follow-up visits as directed by your dentist. This is important. SEEK MEDICAL CARE IF:  Your pain is worse and is not helped by medicine. SEEK IMMEDIATE MEDICAL CARE IF:  You have a fever or chills.  Your symptoms suddenly get worse.  You have a very bad headache.  You have problems breathing or swallowing.  You have trouble opening your mouth.  You have swelling in your neck or around your eye.   This information is not intended to replace advice given to you by your health care provider. Make sure you discuss any questions you have with your health care provider.   Document Released: 05/07/2005 Document Revised: 09/21/2014 Document Reviewed: 05/04/2014 Elsevier Interactive Patient Education [employer]2016 [employer] Inc[employer]

## 2016-07-30 ENCOUNTER — Encounter (HOSPITAL_COMMUNITY): Payer: Self-pay | Admitting: Cardiology

## 2016-07-30 ENCOUNTER — Emergency Department (HOSPITAL_COMMUNITY)
Admission: EM | Admit: 2016-07-30 | Discharge: 2016-07-30 | Disposition: A | Discharge status: Home / Self Care | Payer: Self-pay | Attending: Emergency Medicine | Admitting: Emergency Medicine

## 2016-07-30 ENCOUNTER — Emergency Department (HOSPITAL_COMMUNITY): Payer: Self-pay

## 2016-07-30 DIAGNOSIS — W010XXA Fall on same level from slipping, tripping and stumbling without subsequent striking against object, initial encounter: Secondary | ICD-10-CM | POA: Insufficient documentation

## 2016-07-30 DIAGNOSIS — Y929 Unspecified place or not applicable: Secondary | ICD-10-CM | POA: Insufficient documentation

## 2016-07-30 DIAGNOSIS — Z79899 Other long term (current) drug therapy: Secondary | ICD-10-CM | POA: Insufficient documentation

## 2016-07-30 DIAGNOSIS — W19XXXA Unspecified fall, initial encounter: Secondary | ICD-10-CM

## 2016-07-30 DIAGNOSIS — Y9389 Activity, other specified: Secondary | ICD-10-CM | POA: Insufficient documentation

## 2016-07-30 DIAGNOSIS — Y999 Unspecified external cause status: Secondary | ICD-10-CM | POA: Insufficient documentation

## 2016-07-30 DIAGNOSIS — R52 Pain, unspecified: Secondary | ICD-10-CM | POA: Insufficient documentation

## 2016-07-30 DIAGNOSIS — F17210 Nicotine dependence, cigarettes, uncomplicated: Secondary | ICD-10-CM | POA: Insufficient documentation

## 2016-07-30 NOTE — Discharge Instructions (Signed)
Mr. Colton Nichols,  The xray of your back bones showed that its unlikely you have a fracture there. Please take this next week to rest. I have provided you with a work note. Seek medical attention if your pain becomes worse or you develop any numbness or tingling or are unable to walk.

## 2016-07-30 NOTE — ED Provider Notes (Signed)
[facility]-EMERGENCY DEPT Provider Note   CSN: [id number] Arrival date & time: 07/30/16  1114     History   Chief Complaint Chief Complaint  Patient presents with  . Fall    HPI Colton Nichols is a 51 y.o. male.  HPI  Presents with bilateral gluteal pain after slipping and falling and landing on a rock paver yesterday afternoon. He was trying to help his girlfriend who was falling and she landed on top of him. He does not think that he broke anything in his back. He denies incontinence, numbness, or changes in sensation. He took a tramadol this morning it did not work to relieve the pain. He has been able to walk without difficulty. He denies groin pain.   History reviewed. No pertinent past medical history.  There are no active problems to display for this patient.   Past Surgical History:  Procedure Laterality Date  . LACERATION REPAIR         Home Medications    Prior to Admission medications   Medication Sig Start Date End Date Taking? Authorizing Provider  amoxicillin (AMOXIL) 500 MG capsule Take 1 capsule (500 mg total) by mouth 3 (three) times daily. 10/12/15   Ivery Quale, PA-C  clindamycin (CLEOCIN) 150 MG capsule Take 1 capsule (150 mg total) by mouth every 6 (six) hours. 10/05/15   Ivery Quale, PA-C  HYDROcodone-acetaminophen (NORCO/VICODIN) 5-325 MG tablet Take one-two tabs po q 4-6 hrs prn pain 07/03/15   Tammy Triplett, PA-C  naproxen (NAPROSYN) 500 MG tablet Take 1 tablet (500 mg total) by mouth 2 (two) times daily with a meal. 07/03/15   Tammy Triplett, PA-C  traMADol (ULTRAM) 50 MG tablet Take 1 tablet (50 mg total) by mouth every 6 (six) hours as needed. 10/12/15   Ivery Quale, PA-C    Family History History reviewed. No pertinent family history.  Social History Social History  Substance Use Topics  . Smoking status: Current Every Day Smoker    Packs/day: 0.50    Types: Cigarettes  . Smokeless tobacco: Never Used  . Alcohol use Yes     Comment:  occasional     Allergies   Patient has no known allergies.   Review of Systems Review of Systems  Gastrointestinal: Negative for diarrhea.  Genitourinary: Negative for difficulty urinating.  Musculoskeletal: Negative for back pain.  Neurological: Negative for weakness and numbness.  All other systems reviewed and are negative.    Physical Exam Updated Vital Signs BP 151/86 (BP Location: Left Arm)   Pulse 68   Temp 98.3 F (36.8 C) (Oral)   Resp 16   Ht 6\' 1"  (1.854 m)   Wt 70.3 kg   SpO2 97%   BMI 20.45 kg/m   Physical Exam  Constitutional: He is oriented to person, place, and time. He appears well-developed and well-nourished. No distress.  HENT:  Head: Normocephalic and atraumatic.  Eyes: Conjunctivae are normal. No scleral icterus.  Neck: Normal range of motion.  Musculoskeletal:  Bilateral gluteal tenderness to palpation.  No sacral tenderness to palpation  Hip flexion does not produce groin pain.   Neurological: He is alert and oriented to person, place, and time.  Lower extremity sensation intact  Bilateral lower extremity strength intact and equal with hip flexion, knee flexion and extension, and ankle flexion and extension.   Skin: Skin is warm and dry. He is not diaphoretic.  Psychiatric: He has a normal mood and affect. His behavior is normal.  ED Treatments / Results  Labs (all labs ordered are listed, but only abnormal results are displayed) Labs Reviewed - No data to display  EKG  EKG Interpretation None       Radiology Dg Sacrum/coccyx  Result Date: 07/30/2016 CLINICAL DATA:  Fall yesterday with tail bone pain, initial encounter. EXAM: SACRUM AND COCCYX - 2+ VIEW COMPARISON:  None. FINDINGS: Sacrum and coccyx are intact. Atherosclerotic calcification of the arterial vasculature. IMPRESSION: No acute findings. Electronically Signed   By: Leanna BattlesMelinda  Blietz M.D.   On: 07/30/2016 12:14    Procedures Procedures (including critical care  time)  Medications Ordered in ED Medications - No data to display   Initial Impression / Assessment and Plan / ED Course  I have reviewed the triage vital signs and the nursing notes.  Pertinent labs & imaging results that were available during my care of the patient were reviewed by me and considered in my medical decision making (see chart for details).    51 year old man not on any chronic medications presents with bilateral gluteal pain after falling. He denies groin pain or neurologic symptoms. On exam strength and sensation are intact. Xray of the sacrum and coccyx shows no acute fracture. I think he may have suffered a bruise. Advised rest and continued symptomatic management. Provided a work note. Discussed return precautions.    Final Clinical Impressions(s) / ED Diagnoses   Final diagnoses:  None    New Prescriptions New Prescriptions   No medications on file     Eulah PontNina Donoven Pett, MD 07/30/16 1334    Eulah PontNina Jaishawn Witzke, MD 07/30/16 1341    Eulah PontNina Alika Saladin, MD 07/30/16 1415    Blane OharaJoshua Zavitz, MD 07/30/16 1615

## 2016-07-30 NOTE — ED Triage Notes (Signed)
Fall yesterday and c/o sacrum pain

## 2018-10-07 ENCOUNTER — Other Ambulatory Visit: Payer: Self-pay

## 2018-10-07 ENCOUNTER — Emergency Department (HOSPITAL_COMMUNITY): Payer: Self-pay

## 2018-10-07 ENCOUNTER — Emergency Department (HOSPITAL_COMMUNITY)
Admission: EM | Admit: 2018-10-07 | Discharge: 2018-10-07 | Disposition: A | Discharge status: Home / Self Care | Payer: Self-pay | Attending: Emergency Medicine | Admitting: Emergency Medicine

## 2018-10-07 ENCOUNTER — Encounter (HOSPITAL_COMMUNITY): Payer: Self-pay | Admitting: Emergency Medicine

## 2018-10-07 DIAGNOSIS — S39012A Strain of muscle, fascia and tendon of lower back, initial encounter: Secondary | ICD-10-CM | POA: Insufficient documentation

## 2018-10-07 DIAGNOSIS — Y9301 Activity, walking, marching and hiking: Secondary | ICD-10-CM | POA: Insufficient documentation

## 2018-10-07 DIAGNOSIS — Y929 Unspecified place or not applicable: Secondary | ICD-10-CM | POA: Insufficient documentation

## 2018-10-07 DIAGNOSIS — F17210 Nicotine dependence, cigarettes, uncomplicated: Secondary | ICD-10-CM | POA: Insufficient documentation

## 2018-10-07 DIAGNOSIS — Y998 Other external cause status: Secondary | ICD-10-CM | POA: Insufficient documentation

## 2018-10-07 DIAGNOSIS — W010XXA Fall on same level from slipping, tripping and stumbling without subsequent striking against object, initial encounter: Secondary | ICD-10-CM | POA: Insufficient documentation

## 2018-10-07 MED ORDER — IBUPROFEN 600 MG PO TABS: 600.0000 mg | ORAL_TABLET | Freq: Four times a day (QID) | ORAL | 0 refills | Status: DC | PRN

## 2018-10-07 MED ORDER — METHOCARBAMOL 500 MG PO TABS: 500.0000 mg | ORAL_TABLET | Freq: Two times a day (BID) | ORAL | 0 refills | Status: DC

## 2018-10-07 MED ORDER — HYDROCODONE-ACETAMINOPHEN 5-325 MG PO TABS: 1.0000 | ORAL_TABLET | ORAL | 0 refills | Status: DC | PRN

## 2018-10-07 MED ORDER — HYDROCODONE-ACETAMINOPHEN 5-325 MG PO TABS
2.0000 | ORAL_TABLET | Freq: Once | ORAL | Status: AC
  Administered 2018-10-07: 2 via ORAL
  Filled 2018-10-07: qty 2

## 2018-10-07 NOTE — Discharge Instructions (Signed)
Return if any problems.  See Dr. Romeo Apple if pain persist past one week

## 2018-10-07 NOTE — ED Triage Notes (Signed)
Pt was getting out of a truck Friday night and "stepped wrong". C/o of middle back

## 2018-10-07 NOTE — ED Provider Notes (Signed)
[facility] EMERGENCY DEPARTMENT Provider Note   CSN: [id number] Arrival date & time: 10/07/18  1821    History   Chief Complaint Chief Complaint  Patient presents with  . Back Pain    HPI Colton Nichols is a 53 y.o. male.     The history is provided by the patient. No language interpreter was used.  Back Pain  Location:  Lumbar spine Quality:  Aching Radiates to:  Does not radiate Pain severity:  Moderate Pain is:  Worse during the day Onset quality:  Gradual Timing:  Constant Progression:  Worsening Chronicity:  New Relieved by:  Nothing Worsened by:  Nothing Ineffective treatments:  None tried Associated symptoms: no abdominal pain, no bladder incontinence, no bowel incontinence, no numbness and no paresthesias   Pt reports he stepped and stumbled.  Pt reports he had severe pain in his low back   History reviewed. No pertinent past medical history.  There are no active problems to display for this patient.   Past Surgical History:  Procedure Laterality Date  . LACERATION REPAIR          Home Medications    Prior to Admission medications   Medication Sig Start Date End Date Taking? Authorizing Provider  HYDROcodone-acetaminophen (NORCO/VICODIN) 5-325 MG tablet Take 1 tablet by mouth every 4 (four) hours as needed. 10/07/18   Elson Areas, PA-C  ibuprofen (ADVIL) 600 MG tablet Take 1 tablet (600 mg total) by mouth every 6 (six) hours as needed. 10/07/18   Elson Areas, PA-C  methocarbamol (ROBAXIN) 500 MG tablet Take 1 tablet (500 mg total) by mouth 2 (two) times daily. 10/07/18   Elson Areas, PA-C    Family History History reviewed. No pertinent family history.  Social History Social History   Tobacco Use  . Smoking status: Current Every Day Smoker    Packs/day: 0.50    Types: Cigarettes  . Smokeless tobacco: Never Used  Substance Use Topics  . Alcohol use: Yes    Comment: occasional  . Drug use: No     Allergies   Patient has  no known allergies.   Review of Systems Review of Systems  Gastrointestinal: Negative for abdominal pain and bowel incontinence.  Genitourinary: Negative for bladder incontinence.  Musculoskeletal: Positive for back pain.  Neurological: Negative for numbness and paresthesias.  All other systems reviewed and are negative.    Physical Exam Updated Vital Signs BP (!) 141/94 (BP Location: Right Arm)   Pulse 73   Temp 98.3 F (36.8 C) (Oral)   Resp 14   Ht 6\' 2"  (1.88 m)   Wt 71.7 kg   SpO2 99%   BMI 20.29 kg/m   Physical Exam Vitals signs and nursing note reviewed.  Constitutional:      Appearance: He is well-developed.  HENT:     Head: Normocephalic.  Neck:     Musculoskeletal: Normal range of motion.  Cardiovascular:     Rate and Rhythm: Normal rate.  Pulmonary:     Effort: Pulmonary effort is normal.  Abdominal:     General: Abdomen is flat. There is no distension.  Musculoskeletal: Normal range of motion.     Comments: Tender diffuse lower lumbar spine  Skin:    General: Skin is warm.  Neurological:     General: No focal deficit present.     Mental Status: He is alert and oriented to person, place, and time.  Psychiatric:        Mood  and Affect: Mood normal.      ED Treatments / Results  Labs (all labs ordered are listed, but only abnormal results are displayed) Labs Reviewed - No data to display  EKG None  Radiology Dg Lumbar Spine Complete  Result Date: 10/07/2018 CLINICAL DATA:  53 y/o M; lower back pain radiating to the left hip for 4 days. EXAM: LUMBAR SPINE - COMPLETE 4+ VIEW COMPARISON:  07/03/2015 lumbar spine radiographs. FINDINGS: Five lumbar type non-rib-bearing vertebral bodies. Mild lumbar spine levocurvature with apex at L1. Straightening of lumbar lordosis. Vertebral body and disc space heights are maintained. No listhesis. No acute fracture. Abdominal aortic calcific atherosclerosis. IMPRESSION: 1. No acute fracture dislocation.  Vertebral body and disc spaces are maintained. 2. Mild lumbar spine levocurvature and straightening of lumbar lordosis. 3. Aortic atherosclerosis. Electronically Signed   By: Mitzi HansenLance  Furusawa-Stratton M.D.   On: 10/07/2018 20:52    Procedures Procedures (including critical care time)  Medications Ordered in ED Medications  HYDROcodone-acetaminophen (NORCO/VICODIN) 5-325 MG per tablet 2 tablet (2 tablets Oral Given 10/07/18 2047)     Initial Impression / Assessment and Plan / ED Course  I have reviewed the triage vital signs and the nursing notes.  Pertinent labs & imaging results that were available during my care of the patient were reviewed by me and considered in my medical decision making (see chart for details).        MDM  LS spine no fracture.  Pt counseled on results.  Pt advised to follow up with Orthopaedist if pain persist.  Final Clinical Impressions(s) / ED Diagnoses   Final diagnoses:  Strain of lumbar region, initial encounter    ED Discharge Orders         Ordered    ibuprofen (ADVIL) 600 MG tablet  Every 6 hours PRN     10/07/18 2123    methocarbamol (ROBAXIN) 500 MG tablet  2 times daily     10/07/18 2123    HYDROcodone-acetaminophen (NORCO/VICODIN) 5-325 MG tablet  Every 4 hours PRN     10/07/18 2123        An After Visit Summary was printed and given to the patient.    Osie CheeksSofia, Deborh Pense K, PA-C 10/07/18 2258    Mancel BaleWentz, Elliott, MD 10/08/18 [phone]

## 2018-10-11 ENCOUNTER — Other Ambulatory Visit: Payer: Self-pay

## 2018-10-11 ENCOUNTER — Emergency Department (HOSPITAL_COMMUNITY)
Admission: EM | Admit: 2018-10-11 | Discharge: 2018-10-11 | Disposition: A | Discharge status: Home / Self Care | Payer: Managed Care, Other (non HMO) | Attending: Emergency Medicine | Admitting: Emergency Medicine

## 2018-10-11 ENCOUNTER — Encounter (HOSPITAL_COMMUNITY): Payer: Self-pay | Admitting: Emergency Medicine

## 2018-10-11 DIAGNOSIS — S39012A Strain of muscle, fascia and tendon of lower back, initial encounter: Secondary | ICD-10-CM

## 2018-10-11 DIAGNOSIS — Y9389 Activity, other specified: Secondary | ICD-10-CM | POA: Insufficient documentation

## 2018-10-11 DIAGNOSIS — W19XXXA Unspecified fall, initial encounter: Secondary | ICD-10-CM | POA: Insufficient documentation

## 2018-10-11 DIAGNOSIS — Y998 Other external cause status: Secondary | ICD-10-CM | POA: Insufficient documentation

## 2018-10-11 DIAGNOSIS — F17210 Nicotine dependence, cigarettes, uncomplicated: Secondary | ICD-10-CM | POA: Insufficient documentation

## 2018-10-11 DIAGNOSIS — Y9289 Other specified places as the place of occurrence of the external cause: Secondary | ICD-10-CM | POA: Insufficient documentation

## 2018-10-11 MED ORDER — LIDOCAINE 5 % EX PTCH
2.0000 | MEDICATED_PATCH | CUTANEOUS | Status: DC
  Administered 2018-10-11: 2 via TRANSDERMAL
  Filled 2018-10-11: qty 2

## 2018-10-11 NOTE — ED Provider Notes (Signed)
[facility] EMERGENCY DEPARTMENT Provider Note   CSN: [id number] Arrival date & time: 10/11/18  1456    History   Chief Complaint Chief Complaint  Patient presents with  . Back Pain    HPI Colton Nichols is a 53 y.o. male who presents with low back pain.  No significant past medical history.  The patient states that he had a fall last week and landed hard on his feet.  He is developed diffuse low back pain and stiffness subsequently.  He came to the emergency department on Tuesday and had a lumbar plain film done which was negative and he was sent home with muscle relaxers, ibuprofen, and Norco.  He has been taking this with temporary relief.  He states the pain is worse before he goes to bed and in the morning when his back gets very stiff.  He can't stay still and has to walk a lot. He has been trying BenGay and warm soaks which temporarily help.  He is mostly concerned because he wants to go back to work and he has been off for a week now.  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 He is requesting a work note.  He states he still has pain medicine left and does not need refills. No fever, syncope, trauma, unexplained weight loss, hx of cancer, loss of bowel/bladder function, saddle anesthesia, urinary retention, IVDU.   HPI  History reviewed. No pertinent past medical history.  There are no active problems to display for this patient.   Past Surgical History:  Procedure Laterality Date  . LACERATION REPAIR          Home Medications    Prior to Admission medications   Medication Sig Start Date End Date Taking? Authorizing Provider  HYDROcodone-acetaminophen (NORCO/VICODIN) 5-325 MG tablet Take 1 tablet by mouth every 4 (four) hours as needed. Patient not taking: Reported on 10/11/2018 10/07/18   Elson Areas, PA-C  ibuprofen (ADVIL) 600 MG tablet Take 1 tablet (600 mg total) by mouth every 6  (six) hours as needed. Patient not taking: Reported on 10/11/2018 10/07/18   Elson Areas, PA-C  methocarbamol (ROBAXIN) 500 MG tablet Take 1 tablet (500 mg total) by mouth 2 (two) times daily. Patient not taking: Reported on 10/11/2018 10/07/18   Osie Cheeks    Family History History reviewed. No pertinent family history.  Social History Social History   Tobacco Use  . Smoking status: Current Every Day Smoker    Packs/day: 0.50    Types: Cigarettes  . Smokeless tobacco: Never Used  Substance Use Topics  . Alcohol use: Yes    Comment: occasional  . Drug use: No     Allergies   Patient has no known allergies.   Review of Systems Review of Systems  Constitutional: Negative for fever.  Musculoskeletal: Positive for back pain and myalgias.  Neurological: Negative for weakness and numbness.     Physical Exam Updated Vital Signs BP 130/86 (BP Location: Right Arm)   Pulse 85   Temp 98.7 F (37.1 C) (Oral)   Resp 16   Ht 6\' 2"  (1.88 m)   Wt 71.7 kg   SpO2 98%   BMI 20.29 kg/m   Physical Exam Vitals signs and nursing note reviewed.  Constitutional:      General: He is not in acute distress.    Appearance:  Normal appearance. He is well-developed. He is not ill-appearing.     Comments: Calm and cooperative  HENT:     Head: Normocephalic and atraumatic.  Eyes:     General: No scleral icterus.       Right eye: No discharge.        Left eye: No discharge.     Conjunctiva/sclera: Conjunctivae normal.     Pupils: Pupils are equal, round, and reactive to light.  Neck:     Musculoskeletal: Normal range of motion.  Cardiovascular:     Rate and Rhythm: Normal rate and regular rhythm.  Pulmonary:     Effort: Pulmonary effort is normal. No respiratory distress.     Breath sounds: Normal breath sounds.  Abdominal:     General: There is no distension.  Musculoskeletal:     Comments: Back: Inspection: Small, hard mass over the thoracic spine which pt states  has been present for some time. No deformity or rash. Palpation: No midline spinal tenderness. Bilateral lumbar paraspinal muscle tenderness. Strength: 5/5 in lower extremities and normal plantar and dorsiflexion Sensation: Intact sensation with light touch in lower extremities bilaterally SLR: Negative seated straight leg raise Gait: Normal gait   Skin:    General: Skin is warm and dry.  Neurological:     Mental Status: He is alert and oriented to person, place, and time.  Psychiatric:        Behavior: Behavior normal.      ED Treatments / Results  Labs (all labs ordered are listed, but only abnormal results are displayed) Labs Reviewed - No data to display  EKG None  Radiology No results found.  Procedures Procedures (including critical care time)  Medications Ordered in ED Medications  lidocaine (LIDODERM) 5 % 2 patch (has no administration in time range)     Initial Impression / Assessment and Plan / ED Course  I have reviewed the triage vital signs and the nursing notes.  Pertinent labs & imaging results that were available during my care of the patient were reviewed by me and considered in my medical decision making (see chart for details).  53 year old male presents with ongoing back pain since an injury last week.  He had negative plain films a couple days ago.  Feel like his symptoms are not improving quickly enough although is getting relief with medications.  He is primarily here for a work note since he does a lot of heavy lifting.  The patient was given reassurance and advised that his symptoms will likely improved with some time.  He was also given a Lidoderm patch and advised to obtain this over-the-counter if he feels like this is helping.  He verbalized understanding.  Final Clinical Impressions(s) / ED Diagnoses   Final diagnoses:  Strain of lumbar region, initial encounter    ED Discharge Orders    None       Bethel BornGekas, Erland Vivas Marie, PA-C 10/11/18  1557    Samuel JesterMcManus, Kathleen, DO 10/13/18 1620

## 2018-10-11 NOTE — Discharge Instructions (Signed)
Please continue Ibuprofen and medicine prescribed to you as needed Try lidocaine patch for the back. They are over the counter if you feel like this is helpful Continue warm soaks and gentle stretching Please return if you are worsening

## 2018-10-11 NOTE — ED Triage Notes (Signed)
Pt c/o of back pain since Tuesday. Pt was sent home with narcotics and muscle relaxer. States " I have run out and its not any better"

## 2018-10-27 ENCOUNTER — Encounter (HOSPITAL_COMMUNITY): Payer: Self-pay | Admitting: Emergency Medicine

## 2018-10-27 ENCOUNTER — Emergency Department (HOSPITAL_COMMUNITY): Payer: Managed Care, Other (non HMO)

## 2018-10-27 ENCOUNTER — Other Ambulatory Visit: Payer: Self-pay

## 2018-10-27 DIAGNOSIS — F17210 Nicotine dependence, cigarettes, uncomplicated: Secondary | ICD-10-CM | POA: Insufficient documentation

## 2018-10-27 DIAGNOSIS — R0789 Other chest pain: Secondary | ICD-10-CM | POA: Insufficient documentation

## 2018-10-27 NOTE — ED Triage Notes (Signed)
Pt c/o bilateral rib pain since Saturday while bending down and twisting. He states he heard a pop.

## 2018-10-28 ENCOUNTER — Other Ambulatory Visit: Payer: Self-pay

## 2018-10-28 ENCOUNTER — Emergency Department (HOSPITAL_COMMUNITY)
Admission: EM | Admit: 2018-10-28 | Discharge: 2018-10-28 | Disposition: A | Discharge status: Home / Self Care | Payer: Managed Care, Other (non HMO) | Attending: Emergency Medicine | Admitting: Emergency Medicine

## 2018-10-28 DIAGNOSIS — R52 Pain, unspecified: Secondary | ICD-10-CM

## 2018-10-28 DIAGNOSIS — R0789 Other chest pain: Secondary | ICD-10-CM

## 2018-10-28 MED ORDER — NAPROXEN 500 MG PO TABS: 500.0000 mg | ORAL_TABLET | Freq: Two times a day (BID) | ORAL | 0 refills | Status: DC

## 2018-10-28 MED ORDER — KETOROLAC TROMETHAMINE 30 MG/ML IJ SOLN
30.0000 mg | Freq: Once | INTRAMUSCULAR | Status: AC
  Administered 2018-10-28: 01:00:00 30 mg via INTRAMUSCULAR
  Filled 2018-10-28: qty 1

## 2018-10-28 NOTE — ED Provider Notes (Signed)
[facility] EMERGENCY DEPARTMENT Provider Note   CSN: [id number] Arrival date & time: 10/27/18  2149    History   Chief Complaint Chief Complaint  Patient presents with  . Chest Pain    HPI Colton Nichols is a 53 y.o. male.     HPI  This is a 53 year old male who presents with chest pain.  Patient reports bilateral lower chest pain onset on Saturday.  He states that every couple of years he twists wrong and "something pops in my ribs."  He reports pain.  Patient reports that he was lifting something heavy on Saturday when he felt a pop and has had persistent and constant lower chest pain.  It is nonradiating.  Currently his pain is 8 out of 10.  He has taken ibuprofen and hydrocodone at home with minimal relief.  Denies any fevers, shortness of breath.  Denies any history of heart disease, high blood pressure, high cholesterol, diabetes.  Denies rash.  History reviewed. No pertinent past medical history.  There are no active problems to display for this patient.   Past Surgical History:  Procedure Laterality Date  . LACERATION REPAIR          Home Medications    Prior to Admission medications   Medication Sig Start Date End Date Taking? Authorizing Provider  HYDROcodone-acetaminophen (NORCO/VICODIN) 5-325 MG tablet Take 1 tablet by mouth every 4 (four) hours as needed. Patient not taking: Reported on 10/11/2018 10/07/18   Fransico Meadow, PA-C  ibuprofen (ADVIL) 600 MG tablet Take 1 tablet (600 mg total) by mouth every 6 (six) hours as needed. Patient not taking: Reported on 10/11/2018 10/07/18   Fransico Meadow, PA-C  methocarbamol (ROBAXIN) 500 MG tablet Take 1 tablet (500 mg total) by mouth 2 (two) times daily. Patient not taking: Reported on 10/11/2018 10/07/18   Fransico Meadow, PA-C  naproxen (NAPROSYN) 500 MG tablet Take 1 tablet (500 mg total) by mouth 2 (two) times daily. 10/28/18   Horton, Barbette Hair, MD    Family History No family history on file.  Social  History Social History   Tobacco Use  . Smoking status: Current Every Day Smoker    Packs/day: 0.50    Types: Cigarettes  . Smokeless tobacco: Never Used  Substance Use Topics  . Alcohol use: Yes    Comment: occasional  . Drug use: No     Allergies   Patient has no known allergies.   Review of Systems Review of Systems  Constitutional: Negative for fever.  Respiratory: Negative for cough and shortness of breath.   Cardiovascular: Positive for chest pain. Negative for leg swelling.  Gastrointestinal: Negative for abdominal pain.  Skin: Negative for rash.  All other systems reviewed and are negative.    Physical Exam Updated Vital Signs BP (!) 156/90 (BP Location: Right Arm)   Pulse 71   Temp 98.1 F (36.7 C) (Oral)   Resp 20   Ht 1.88 m (6\' 2" )   Wt 71.6 kg   SpO2 100%   BMI 20.27 kg/m   Physical Exam Vitals signs and nursing note reviewed.  Constitutional:      Appearance: He is well-developed. He is not ill-appearing.  HENT:     Head: Normocephalic and atraumatic.  Neck:     Musculoskeletal: Neck supple.  Cardiovascular:     Rate and Rhythm: Normal rate and regular rhythm.     Heart sounds: Normal heart sounds. No murmur.     Comments:  Tenderness to palpation bilateral lower chest wall, no crepitus or overlying skin changes Pulmonary:     Effort: Pulmonary effort is normal. No respiratory distress.     Breath sounds: Normal breath sounds. No wheezing.  Abdominal:     Palpations: Abdomen is soft.     Tenderness: There is no abdominal tenderness. There is no rebound.     Comments: Abdominal scarring noted  Musculoskeletal:     Right lower leg: He exhibits no tenderness. No edema.     Left lower leg: He exhibits no tenderness. No edema.  Skin:    General: Skin is warm and dry.  Neurological:     Mental Status: He is alert and oriented to person, place, and time.  Psychiatric:        Mood and Affect: Mood normal.      ED Treatments / Results   Labs (all labs ordered are listed, but only abnormal results are displayed) Labs Reviewed - No data to display  EKG EKG Interpretation  Date/Time:  Tuesday October 28 2018 00:44:10 EDT Ventricular Rate:  56 PR Interval:    QRS Duration: 80 QT Interval:  406 QTC Calculation: 392 R Axis:   73 Text Interpretation:  Sinus arrhythmia Anteroseptal infarct, age indeterminate No prior for comparison Confirmed by Ross MarcusHorton, Courtney ([id number]) on 10/28/2018 12:53:26 AM   Radiology Dg Chest 2 View  Result Date: 10/27/2018 CLINICAL DATA:  Pain to low anterior left chest. EXAM: CHEST - 2 VIEW COMPARISON:  10/10/2009. FINDINGS: The heart size is normal. The lungs are hyperexpanded. There is no pneumothorax. No large pleural effusion. No acute osseous abnormality. IMPRESSION: No active cardiopulmonary disease. Electronically Signed   By: Katherine Mantlehristopher  Green M.D.   On: 10/27/2018 22:23    Procedures Procedures (including critical care time)  Medications Ordered in ED Medications  ketorolac (TORADOL) 30 MG/ML injection 30 mg (30 mg Intramuscular Given 10/28/18 0039)     Initial Impression / Assessment and Plan / ED Course  I have reviewed the triage vital signs and the nursing notes.  Pertinent labs & imaging results that were available during my care of the patient were reviewed by me and considered in my medical decision making (see chart for details).        Patient presents with chest pain.  It is reproducible on exam.  Reports heavy lifting and constant pain.  Very atypical for ACS or PE.  X-ray shows no evidence of pneumothorax or pneumonia.  EKG does not show any evidence of acute ischemia.  Patient was given Toradol.  Recommend scheduled anti-inflammatories.  After history, exam, and medical workup I feel the patient has been appropriately medically screened and is safe for discharge home. Pertinent diagnoses were discussed with the patient. Patient was given return precautions.   Final  Clinical Impressions(s) / ED Diagnoses   Final diagnoses:  Pain  Chest wall pain    ED Discharge Orders         Ordered    naproxen (NAPROSYN) 500 MG tablet  2 times daily     10/28/18 0053           Horton, Mayer Maskerourtney F, MD 10/28/18 [phone]

## 2018-12-05 ENCOUNTER — Emergency Department (HOSPITAL_COMMUNITY)
Admission: EM | Admit: 2018-12-05 | Discharge: 2018-12-05 | Disposition: A | Discharge status: Home / Self Care | Payer: Managed Care, Other (non HMO) | Attending: Emergency Medicine | Admitting: Emergency Medicine

## 2018-12-05 ENCOUNTER — Other Ambulatory Visit: Payer: Self-pay

## 2018-12-05 ENCOUNTER — Encounter (HOSPITAL_COMMUNITY): Payer: Self-pay | Admitting: Emergency Medicine

## 2018-12-05 ENCOUNTER — Emergency Department (HOSPITAL_COMMUNITY): Payer: Managed Care, Other (non HMO)

## 2018-12-05 DIAGNOSIS — M436 Torticollis: Secondary | ICD-10-CM | POA: Insufficient documentation

## 2018-12-05 DIAGNOSIS — F17210 Nicotine dependence, cigarettes, uncomplicated: Secondary | ICD-10-CM | POA: Insufficient documentation

## 2018-12-05 DIAGNOSIS — R202 Paresthesia of skin: Secondary | ICD-10-CM | POA: Diagnosis present

## 2018-12-05 DIAGNOSIS — Z79899 Other long term (current) drug therapy: Secondary | ICD-10-CM | POA: Insufficient documentation

## 2018-12-05 DIAGNOSIS — M25512 Pain in left shoulder: Secondary | ICD-10-CM | POA: Diagnosis not present

## 2018-12-05 MED ORDER — PREDNISONE 10 MG PO TABS: ORAL_TABLET | ORAL | 0 refills | Status: DC

## 2018-12-05 MED ORDER — IBUPROFEN 800 MG PO TABS
800.0000 mg | ORAL_TABLET | Freq: Once | ORAL | Status: AC
  Administered 2018-12-05: 800 mg via ORAL
  Filled 2018-12-05: qty 1

## 2018-12-05 MED ORDER — IBUPROFEN 800 MG PO TABS: 800.0000 mg | ORAL_TABLET | Freq: Three times a day (TID) | ORAL | 0 refills | Status: DC

## 2018-12-05 MED ORDER — DIAZEPAM 5 MG PO TABS
5.0000 mg | ORAL_TABLET | Freq: Once | ORAL | Status: AC
  Administered 2018-12-05: 5 mg via ORAL
  Filled 2018-12-05: qty 1

## 2018-12-05 MED ORDER — DIAZEPAM 5 MG PO TABS: 5.0000 mg | ORAL_TABLET | Freq: Three times a day (TID) | ORAL | 0 refills | Status: AC | PRN

## 2018-12-05 NOTE — Discharge Instructions (Signed)
Return if any problems.

## 2018-12-05 NOTE — ED Triage Notes (Signed)
Pt c/o "pinched nerve" in shoulder upon waking up this morning. C/o numbess and tingling in left arm and shoulder and neck. No other deficits

## 2018-12-05 NOTE — ED Provider Notes (Signed)
[facility] EMERGENCY DEPARTMENT Provider Note   CSN: [id number] Arrival date & time: 12/05/18  2025     History   Chief Complaint Chief Complaint  Patient presents with  . Shoulder Pain    HPI Colton Nichols is a 53 y.o. male.     The history is provided by the patient. No language interpreter was used.    History reviewed. No pertinent past medical history.  There are no active problems to display for this patient.   Past Surgical History:  Procedure Laterality Date  . LACERATION REPAIR          Home Medications    Prior to Admission medications   Medication Sig Start Date End Date Taking? Authorizing Provider  HYDROcodone-acetaminophen (NORCO/VICODIN) 5-325 MG tablet Take 1 tablet by mouth every 4 (four) hours as needed. Patient not taking: Reported on 10/11/2018 10/07/18   Elson AreasSofia, Leslie K, PA-C  ibuprofen (ADVIL) 600 MG tablet Take 1 tablet (600 mg total) by mouth every 6 (six) hours as needed. Patient not taking: Reported on 10/11/2018 10/07/18   Elson AreasSofia, Leslie K, PA-C  methocarbamol (ROBAXIN) 500 MG tablet Take 1 tablet (500 mg total) by mouth 2 (two) times daily. Patient not taking: Reported on 10/11/2018 10/07/18   Elson AreasSofia, Leslie K, PA-C  naproxen (NAPROSYN) 500 MG tablet Take 1 tablet (500 mg total) by mouth 2 (two) times daily. 10/28/18   Horton, Mayer Maskerourtney F, MD    Family History History reviewed. No pertinent family history.  Social History Social History   Tobacco Use  . Smoking status: Current Every Day Smoker    Packs/day: 0.50    Types: Cigarettes  . Smokeless tobacco: Never Used  Substance Use Topics  . Alcohol use: Yes    Comment: occasional  . Drug use: No     Allergies   Patient has no known allergies.   Review of Systems Review of Systems  All other systems reviewed and are negative.    Physical Exam Updated Vital Signs BP (!) 153/83 (BP Location: Right Arm)   Pulse 87   Temp 98.1 F (36.7 C) (Oral)   Resp 18   Ht 6\' 1"   (1.854 m)   Wt 68 kg   SpO2 98%   BMI 19.79 kg/m   Physical Exam Vitals signs reviewed.  HENT:     Head: Normocephalic.     Right Ear: Tympanic membrane normal.     Left Ear: Tympanic membrane normal.     Nose: Nose normal.     Mouth/Throat:     Mouth: Mucous membranes are moist.  Eyes:     Pupils: Pupils are equal, round, and reactive to light.  Neck:     Musculoskeletal: Normal range of motion.  Cardiovascular:     Rate and Rhythm: Normal rate.     Pulses: Normal pulses.     Heart sounds: Normal heart sounds.  Pulmonary:     Effort: Pulmonary effort is normal.  Abdominal:     General: Abdomen is flat.  Musculoskeletal: Normal range of motion.  Skin:    General: Skin is warm.  Neurological:     General: No focal deficit present.     Mental Status: He is alert.  Psychiatric:        Mood and Affect: Mood normal.      ED Treatments / Results  Labs (all labs ordered are listed, but only abnormal results are displayed) Labs Reviewed - No data to display  EKG None  Radiology Dg Cervical Spine Complete  Result Date: 12/05/2018 CLINICAL DATA:  Numbness and tingling in left arm. Neck and left arm pain. EXAM: CERVICAL SPINE - COMPLETE 4+ VIEW COMPARISON:  None. FINDINGS: There is no evidence of cervical spine fracture or prevertebral soft tissue swelling. Alignment is normal. No other significant bone abnormalities are identified. IMPRESSION: Negative cervical spine radiographs. Electronically Signed   By: Rolm Baptise M.D.   On: 12/05/2018 21:38   Dg Shoulder Left  Result Date: 12/05/2018 CLINICAL DATA:  Neck pain radiating to left shoulder. Numbness and tingling in left arm. EXAM: LEFT SHOULDER - 2+ VIEW COMPARISON:  None. FINDINGS: There is no evidence of fracture or dislocation. There is no evidence of arthropathy or other focal bone abnormality. Soft tissues are unremarkable. IMPRESSION: Negative. Electronically Signed   By: Rolm Baptise M.D.   On: 12/05/2018 21:34     Procedures Procedures (including critical care time)  Medications Ordered in ED Medications  ibuprofen (ADVIL) tablet 800 mg (800 mg Oral Given 12/05/18 2110)  diazepam (VALIUM) tablet 5 mg (5 mg Oral Given 12/05/18 2110)     Initial Impression / Assessment and Plan / ED Course  I have reviewed the triage vital signs and the nursing notes.  Pertinent labs & imaging results that were available during my care of the patient were reviewed by me and considered in my medical decision making (see chart for details).        MDM Pt given valium and ibuprofen.  Pt reports some relief.  Xrays reviewed and discussed with pt.  Pt advised to follow up with Dr. Aline Brochure if symptoms persist.   Final Clinical Impressions(s) / ED Diagnoses   Final diagnoses:  Torticollis    ED Discharge Orders         Ordered    predniSONE (DELTASONE) 10 MG tablet     12/05/18 2223    diazepam (VALIUM) 5 MG tablet  Every 8 hours PRN     12/05/18 2223    ibuprofen (ADVIL) 800 MG tablet  3 times daily     12/05/18 2223        An After Visit Summary was printed and given to the patient.    Fransico Meadow, PA-C 12/05/18 2316    Francine Graven, DO 12/07/18 1525

## 2019-05-01 ENCOUNTER — Other Ambulatory Visit: Payer: Self-pay

## 2019-05-01 DIAGNOSIS — Z20822 Contact with and (suspected) exposure to covid-19: Secondary | ICD-10-CM

## 2019-05-02 LAB — NOVEL CORONAVIRUS, NAA: SARS-CoV-2, NAA: NOT DETECTED

## 2019-12-19 ENCOUNTER — Inpatient Hospital Stay (HOSPITAL_COMMUNITY): Payer: Medicaid Other | Admitting: Anesthesiology

## 2019-12-19 ENCOUNTER — Inpatient Hospital Stay (HOSPITAL_COMMUNITY): Payer: Medicaid Other

## 2019-12-19 ENCOUNTER — Emergency Department (HOSPITAL_COMMUNITY): Payer: Medicaid Other

## 2019-12-19 ENCOUNTER — Inpatient Hospital Stay (HOSPITAL_COMMUNITY)
Admission: EM | Admit: 2019-12-19 | Discharge: 2019-12-22 | DRG: 459 | Disposition: A | Discharge status: Rehabilitation Facility | Payer: Medicaid Other | Attending: Neurosurgery | Admitting: Neurosurgery

## 2019-12-19 ENCOUNTER — Other Ambulatory Visit: Payer: Self-pay

## 2019-12-19 ENCOUNTER — Encounter (HOSPITAL_COMMUNITY)
Admission: EM | Disposition: A | Discharge status: Rehabilitation Facility | Payer: Self-pay | Source: Home / Self Care | Attending: Neurosurgery

## 2019-12-19 ENCOUNTER — Encounter (HOSPITAL_COMMUNITY): Payer: Self-pay

## 2019-12-19 DIAGNOSIS — G834 Cauda equina syndrome: Secondary | ICD-10-CM | POA: Diagnosis present

## 2019-12-19 DIAGNOSIS — M48061 Spinal stenosis, lumbar region without neurogenic claudication: Secondary | ICD-10-CM | POA: Diagnosis present

## 2019-12-19 DIAGNOSIS — Z419 Encounter for procedure for purposes other than remedying health state, unspecified: Secondary | ICD-10-CM

## 2019-12-19 DIAGNOSIS — S32031A Stable burst fracture of third lumbar vertebra, initial encounter for closed fracture: Principal | ICD-10-CM | POA: Diagnosis present

## 2019-12-19 DIAGNOSIS — Z20822 Contact with and (suspected) exposure to covid-19: Secondary | ICD-10-CM | POA: Diagnosis present

## 2019-12-19 DIAGNOSIS — S32001A Stable burst fracture of unspecified lumbar vertebra, initial encounter for closed fracture: Secondary | ICD-10-CM | POA: Diagnosis present

## 2019-12-19 DIAGNOSIS — S32032A Unstable burst fracture of third lumbar vertebra, initial encounter for closed fracture: Secondary | ICD-10-CM

## 2019-12-19 DIAGNOSIS — S34103A Unspecified injury to L3 level of lumbar spinal cord, initial encounter: Secondary | ICD-10-CM | POA: Diagnosis present

## 2019-12-19 DIAGNOSIS — F17210 Nicotine dependence, cigarettes, uncomplicated: Secondary | ICD-10-CM | POA: Diagnosis present

## 2019-12-19 DIAGNOSIS — Z09 Encounter for follow-up examination after completed treatment for conditions other than malignant neoplasm: Secondary | ICD-10-CM

## 2019-12-19 DIAGNOSIS — W010XXA Fall on same level from slipping, tripping and stumbling without subsequent striking against object, initial encounter: Secondary | ICD-10-CM | POA: Diagnosis not present

## 2019-12-19 HISTORY — PX: POSTERIOR LUMBAR FUSION 4 LEVEL: SHX6037

## 2019-12-19 LAB — BASIC METABOLIC PANEL
Anion gap: 15 (ref 5–15)
BUN: 14 mg/dL (ref 6–20)
CO2: 18 mmol/L — ABNORMAL LOW (ref 22–32)
Calcium: 9.2 mg/dL (ref 8.9–10.3)
Chloride: 104 mmol/L (ref 98–111)
Creatinine, Ser: 1.04 mg/dL (ref 0.61–1.24)
GFR calc Af Amer: >60 mL/min (ref >60)
GFR calc non Af Amer: >60 mL/min (ref >60)
Glucose, Bld: 114 mg/dL — ABNORMAL HIGH (ref 70–99)
Potassium: 3.7 mmol/L (ref 3.5–5.1)
Sodium: 137 mmol/L (ref 135–145)

## 2019-12-19 LAB — CBC
HCT: 39.8 % (ref 39.0–52.0)
Hemoglobin: 13.8 g/dL (ref 13.0–17.0)
MCH: 35.6 pg — ABNORMAL HIGH (ref 26.0–34.0)
MCHC: 34.7 g/dL (ref 30.0–36.0)
MCV: 102.6 fL — ABNORMAL HIGH (ref 80.0–100.0)
Platelets: 202 10*3/uL (ref 150–400)
RBC: 3.88 MIL/uL — ABNORMAL LOW (ref 4.22–5.81)
RDW: 14 % (ref 11.5–15.5)
WBC: 17.1 10*3/uL — ABNORMAL HIGH (ref 4.0–10.5)
nRBC: 0 % (ref 0.0–0.2)

## 2019-12-19 LAB — SARS CORONAVIRUS 2 BY RT PCR (HOSPITAL ORDER, PERFORMED IN ~~LOC~~ HOSPITAL LAB): SARS Coronavirus 2: NEGATIVE

## 2019-12-19 LAB — PROTIME-INR
INR: 1 (ref 0.8–1.2)
Prothrombin Time: 12.6 seconds (ref 11.4–15.2)

## 2019-12-19 LAB — ABO/RH: ABO/RH(D): O POS

## 2019-12-19 SURGERY — POSTERIOR LUMBAR FUSION 4 LEVEL
Anesthesia: General | Site: Back

## 2019-12-19 MED ORDER — HYDROMORPHONE HCL 1 MG/ML IJ SOLN
1.0000 mg | Freq: Once | INTRAMUSCULAR | Status: AC
  Administered 2019-12-19: 1 mg via INTRAVENOUS
  Filled 2019-12-19: qty 1

## 2019-12-19 MED ORDER — OXYCODONE HCL 5 MG/5ML PO SOLN: 5.0000 mg | Freq: Once | ORAL | Status: DC | PRN

## 2019-12-19 MED ORDER — LIDOCAINE 2% (20 MG/ML) 5 ML SYRINGE
INTRAMUSCULAR | Status: DC | PRN
  Administered 2019-12-19: 60 mg via INTRAVENOUS

## 2019-12-19 MED ORDER — OXYCODONE HCL 5 MG PO TABS
10.0000 mg | ORAL_TABLET | ORAL | Status: DC | PRN
  Administered 2019-12-19 – 2019-12-22 (×8): 10 mg via ORAL
  Filled 2019-12-19 (×8): qty 2

## 2019-12-19 MED ORDER — FENTANYL CITRATE (PF) 100 MCG/2ML IJ SOLN
INTRAMUSCULAR | Status: AC
  Filled 2019-12-19: qty 2

## 2019-12-19 MED ORDER — FENTANYL CITRATE (PF) 250 MCG/5ML IJ SOLN
INTRAMUSCULAR | Status: AC
  Filled 2019-12-19: qty 5

## 2019-12-19 MED ORDER — DIAZEPAM 5 MG PO TABS
ORAL_TABLET | ORAL | Status: AC
  Administered 2019-12-19: 10 mg via ORAL
  Filled 2019-12-19: qty 1

## 2019-12-19 MED ORDER — VANCOMYCIN HCL 1000 MG IV SOLR
INTRAVENOUS | Status: AC
  Filled 2019-12-19: qty 1000

## 2019-12-19 MED ORDER — AMISULPRIDE (ANTIEMETIC) 5 MG/2ML IV SOLN: 10.0000 mg | Freq: Once | INTRAVENOUS | Status: DC | PRN

## 2019-12-19 MED ORDER — HYDROCODONE-ACETAMINOPHEN 10-325 MG PO TABS
1.0000 | ORAL_TABLET | ORAL | Status: DC | PRN
  Administered 2019-12-19 – 2019-12-22 (×5): 1 via ORAL
  Filled 2019-12-19 (×5): qty 1

## 2019-12-19 MED ORDER — LACTATED RINGERS IV SOLN
INTRAVENOUS | Status: DC | PRN
Start: 2019-12-19 — End: 2019-12-19

## 2019-12-19 MED ORDER — CEFAZOLIN SODIUM-DEXTROSE 2-4 GM/100ML-% IV SOLN
INTRAVENOUS | Status: AC
  Filled 2019-12-19: qty 100

## 2019-12-19 MED ORDER — BISACODYL 10 MG RE SUPP: 10.0000 mg | Freq: Every day | RECTAL | Status: DC | PRN

## 2019-12-19 MED ORDER — SODIUM CHLORIDE 0.9 % IV SOLN
250.0000 mL | INTRAVENOUS | Status: DC
  Administered 2019-12-19: 250 mL via INTRAVENOUS

## 2019-12-19 MED ORDER — FENTANYL CITRATE (PF) 250 MCG/5ML IJ SOLN
INTRAMUSCULAR | Status: DC | PRN
  Administered 2019-12-19: 25 ug via INTRAVENOUS
  Administered 2019-12-19 (×5): 50 ug via INTRAVENOUS
  Administered 2019-12-19: 25 ug via INTRAVENOUS

## 2019-12-19 MED ORDER — HYDROMORPHONE HCL 1 MG/ML IJ SOLN
1.0000 mg | INTRAMUSCULAR | Status: DC | PRN
  Administered 2019-12-20 – 2019-12-21 (×5): 1 mg via INTRAVENOUS
  Filled 2019-12-19 (×5): qty 1

## 2019-12-19 MED ORDER — OXYCODONE HCL 5 MG PO TABS: 5.0000 mg | ORAL_TABLET | Freq: Once | ORAL | Status: DC | PRN

## 2019-12-19 MED ORDER — ACETAMINOPHEN 325 MG PO TABS
650.0000 mg | ORAL_TABLET | ORAL | Status: DC | PRN
  Administered 2019-12-21 – 2019-12-22 (×5): 650 mg via ORAL
  Filled 2019-12-19 (×5): qty 2

## 2019-12-19 MED ORDER — CEFAZOLIN SODIUM-DEXTROSE 2-4 GM/100ML-% IV SOLN: 2.0000 g | INTRAVENOUS | Status: DC

## 2019-12-19 MED ORDER — FLEET ENEMA 7-19 GM/118ML RE ENEM
1.0000 | ENEMA | Freq: Once | RECTAL | Status: DC | PRN
  Filled 2019-12-19: qty 1

## 2019-12-19 MED ORDER — ONDANSETRON HCL 4 MG PO TABS: 4.0000 mg | ORAL_TABLET | Freq: Four times a day (QID) | ORAL | Status: DC | PRN

## 2019-12-19 MED ORDER — SODIUM CHLORIDE 0.9 % IV SOLN: 10.0000 mL/h | Freq: Once | INTRAVENOUS | Status: DC

## 2019-12-19 MED ORDER — THROMBIN 5000 UNITS EX SOLR
OROMUCOSAL | Status: DC | PRN
  Administered 2019-12-19 (×3): 5 mL via TOPICAL

## 2019-12-19 MED ORDER — POLYETHYLENE GLYCOL 3350 17 G PO PACK
17.0000 g | PACK | Freq: Every day | ORAL | Status: DC | PRN
  Administered 2019-12-21 – 2019-12-22 (×2): 17 g via ORAL
  Filled 2019-12-19 (×2): qty 1

## 2019-12-19 MED ORDER — PROMETHAZINE HCL 25 MG/ML IJ SOLN
6.2500 mg | INTRAMUSCULAR | Status: DC | PRN
  Administered 2019-12-19: 6.25 mg via INTRAVENOUS

## 2019-12-19 MED ORDER — CEFAZOLIN SODIUM-DEXTROSE 2-3 GM-%(50ML) IV SOLR
INTRAVENOUS | Status: DC | PRN
  Administered 2019-12-19: 2 g via INTRAVENOUS

## 2019-12-19 MED ORDER — ACETAMINOPHEN 10 MG/ML IV SOLN
INTRAVENOUS | Status: DC | PRN
Start: 2019-12-19 — End: 2019-12-19
  Administered 2019-12-19: 1000 mg via INTRAVENOUS

## 2019-12-19 MED ORDER — PHENYLEPHRINE HCL-NACL 10-0.9 MG/250ML-% IV SOLN
INTRAVENOUS | Status: DC | PRN
  Administered 2019-12-19: 15 ug/min via INTRAVENOUS

## 2019-12-19 MED ORDER — 0.9 % SODIUM CHLORIDE (POUR BTL) OPTIME
TOPICAL | Status: DC | PRN
  Administered 2019-12-19: 1000 mL

## 2019-12-19 MED ORDER — ONDANSETRON HCL 4 MG/2ML IJ SOLN
INTRAMUSCULAR | Status: DC | PRN
  Administered 2019-12-19: 4 mg via INTRAVENOUS

## 2019-12-19 MED ORDER — ONDANSETRON HCL 4 MG/2ML IJ SOLN: 4.0000 mg | Freq: Four times a day (QID) | INTRAMUSCULAR | Status: DC | PRN

## 2019-12-19 MED ORDER — VANCOMYCIN HCL 1000 MG IV SOLR
INTRAVENOUS | Status: DC | PRN
  Administered 2019-12-19: 1000 mg

## 2019-12-19 MED ORDER — SODIUM CHLORIDE 0.9 % IV SOLN
INTRAVENOUS | Status: DC | PRN
  Administered 2019-12-19: 500 mL

## 2019-12-19 MED ORDER — CEFAZOLIN SODIUM-DEXTROSE 1-4 GM/50ML-% IV SOLN
1.0000 g | Freq: Three times a day (TID) | INTRAVENOUS | Status: AC
  Administered 2019-12-19 – 2019-12-20 (×2): 1 g via INTRAVENOUS
  Filled 2019-12-19 (×2): qty 50

## 2019-12-19 MED ORDER — HYDROMORPHONE HCL 1 MG/ML IJ SOLN: 0.2500 mg | INTRAMUSCULAR | Status: DC | PRN

## 2019-12-19 MED ORDER — SUCCINYLCHOLINE CHLORIDE 200 MG/10ML IV SOSY
PREFILLED_SYRINGE | INTRAVENOUS | Status: DC | PRN
  Administered 2019-12-19: 100 mg via INTRAVENOUS

## 2019-12-19 MED ORDER — THROMBIN 20000 UNITS EX SOLR
CUTANEOUS | Status: DC | PRN
  Administered 2019-12-19: 20 mL via TOPICAL

## 2019-12-19 MED ORDER — MENTHOL 3 MG MT LOZG
1.0000 | LOZENGE | OROMUCOSAL | Status: DC | PRN
  Filled 2019-12-19: qty 9

## 2019-12-19 MED ORDER — BUPIVACAINE HCL (PF) 0.25 % IJ SOLN
INTRAMUSCULAR | Status: AC
  Filled 2019-12-19: qty 30

## 2019-12-19 MED ORDER — PROMETHAZINE HCL 25 MG/ML IJ SOLN
INTRAMUSCULAR | Status: AC
  Filled 2019-12-19: qty 1

## 2019-12-19 MED ORDER — HYDROMORPHONE HCL 1 MG/ML IJ SOLN
INTRAMUSCULAR | Status: AC
  Filled 2019-12-19: qty 1

## 2019-12-19 MED ORDER — SODIUM CHLORIDE 0.9% FLUSH: 3.0000 mL | INTRAVENOUS | Status: DC | PRN

## 2019-12-19 MED ORDER — MIDAZOLAM HCL 2 MG/2ML IJ SOLN
INTRAMUSCULAR | Status: AC
  Filled 2019-12-19: qty 2

## 2019-12-19 MED ORDER — DIAZEPAM 5 MG PO TABS
5.0000 mg | ORAL_TABLET | Freq: Four times a day (QID) | ORAL | Status: DC | PRN
  Administered 2019-12-19: 5 mg via ORAL
  Administered 2019-12-20 (×2): 10 mg via ORAL
  Filled 2019-12-19 (×3): qty 2

## 2019-12-19 MED ORDER — DEXAMETHASONE SODIUM PHOSPHATE 10 MG/ML IJ SOLN
INTRAMUSCULAR | Status: DC | PRN
  Administered 2019-12-19: 10 mg via INTRAVENOUS

## 2019-12-19 MED ORDER — LACTATED RINGERS IV SOLN: INTRAVENOUS | Status: DC | PRN

## 2019-12-19 MED ORDER — OXYCODONE HCL 5 MG PO TABS
ORAL_TABLET | ORAL | Status: AC
  Filled 2019-12-19: qty 2

## 2019-12-19 MED ORDER — CHLORHEXIDINE GLUCONATE CLOTH 2 % EX PADS
6.0000 | MEDICATED_PAD | Freq: Every day | CUTANEOUS | Status: DC
  Administered 2019-12-19 – 2019-12-22 (×3): 6 via TOPICAL

## 2019-12-19 MED ORDER — MIDAZOLAM HCL 2 MG/2ML IJ SOLN
INTRAMUSCULAR | Status: DC | PRN
  Administered 2019-12-19: 2 mg via INTRAVENOUS

## 2019-12-19 MED ORDER — PROPOFOL 10 MG/ML IV BOLUS
INTRAVENOUS | Status: DC | PRN
  Administered 2019-12-19: 120 mg via INTRAVENOUS

## 2019-12-19 MED ORDER — PHENOL 1.4 % MT LIQD: 1.0000 | OROMUCOSAL | Status: DC | PRN

## 2019-12-19 MED ORDER — THROMBIN 5000 UNITS EX SOLR
CUTANEOUS | Status: AC
  Filled 2019-12-19: qty 10000

## 2019-12-19 MED ORDER — HYDROMORPHONE HCL 1 MG/ML IJ SOLN
0.2500 mg | INTRAMUSCULAR | Status: DC | PRN
  Administered 2019-12-19: 0.5 mg via INTRAVENOUS

## 2019-12-19 MED ORDER — ROCURONIUM BROMIDE 10 MG/ML (PF) SYRINGE
PREFILLED_SYRINGE | INTRAVENOUS | Status: DC | PRN
  Administered 2019-12-19: 20 mg via INTRAVENOUS
  Administered 2019-12-19: 30 mg via INTRAVENOUS
  Administered 2019-12-19: 50 mg via INTRAVENOUS

## 2019-12-19 MED ORDER — ACETAMINOPHEN 650 MG RE SUPP: 650.0000 mg | RECTAL | Status: DC | PRN

## 2019-12-19 MED ORDER — THROMBIN 5000 UNITS EX SOLR
CUTANEOUS | Status: AC
  Filled 2019-12-19: qty 5000

## 2019-12-19 MED ORDER — SODIUM CHLORIDE 0.9% FLUSH
3.0000 mL | Freq: Two times a day (BID) | INTRAVENOUS | Status: DC
  Administered 2019-12-19 – 2019-12-21 (×3): 3 mL via INTRAVENOUS
  Administered 2019-12-21 – 2019-12-22 (×2): 10 mL via INTRAVENOUS

## 2019-12-19 MED ORDER — ALBUMIN HUMAN 5 % IV SOLN: INTRAVENOUS | Status: DC | PRN

## 2019-12-19 MED ORDER — SUGAMMADEX SODIUM 200 MG/2ML IV SOLN
INTRAVENOUS | Status: DC | PRN
  Administered 2019-12-19: 200 mg via INTRAVENOUS

## 2019-12-19 MED ORDER — THROMBIN 20000 UNITS EX SOLR
CUTANEOUS | Status: AC
  Filled 2019-12-19: qty 20000

## 2019-12-19 MED ORDER — HYDROMORPHONE HCL 1 MG/ML IJ SOLN
INTRAMUSCULAR | Status: AC
  Administered 2019-12-19: 1 mg
  Filled 2019-12-19: qty 1

## 2019-12-19 MED ORDER — PHENYLEPHRINE HCL (PRESSORS) 10 MG/ML IV SOLN
INTRAVENOUS | Status: DC | PRN
  Administered 2019-12-19: 80 ug via INTRAVENOUS

## 2019-12-19 SURGICAL SUPPLY — 70 items
ADH SKN CLS APL DERMABOND .7 (GAUZE/BANDAGES/DRESSINGS) ×1
APL SKNCLS STERI-STRIP NONHPOA (GAUZE/BANDAGES/DRESSINGS) ×1
BAG DECANTER FOR FLEXI CONT (MISCELLANEOUS) ×2 IMPLANT
BENZOIN TINCTURE PRP APPL 2/3 (GAUZE/BANDAGES/DRESSINGS) ×2 IMPLANT
BLADE CLIPPER SURG (BLADE) IMPLANT
BUR CUTTER 7.0 ROUND (BURR) ×2 IMPLANT
BUR MATCHSTICK NEURO 3.0 LAGG (BURR) ×2 IMPLANT
CANISTER SUCT 3000ML PPV (MISCELLANEOUS) ×2 IMPLANT
CARTRIDGE OIL MAESTRO DRILL (MISCELLANEOUS) ×1 IMPLANT
CLSR STERI-STRIP ANTIMIC 1/2X4 (GAUZE/BANDAGES/DRESSINGS) ×4 IMPLANT
CNTNR URN SCR LID CUP LEK RST (MISCELLANEOUS) ×1 IMPLANT
CONT SPEC 4OZ STRL OR WHT (MISCELLANEOUS) ×2
COVER BACK TABLE 60X90IN (DRAPES) ×2 IMPLANT
COVER WAND RF STERILE (DRAPES) ×2 IMPLANT
DECANTER SPIKE VIAL GLASS SM (MISCELLANEOUS) ×2 IMPLANT
DERMABOND ADVANCED (GAUZE/BANDAGES/DRESSINGS) ×1
DERMABOND ADVANCED .7 DNX12 (GAUZE/BANDAGES/DRESSINGS) ×1 IMPLANT
DIFFUSER DRILL AIR PNEUMATIC (MISCELLANEOUS) ×2 IMPLANT
DRAPE C-ARM 42X72 X-RAY (DRAPES) ×4 IMPLANT
DRAPE HALF SHEET 40X57 (DRAPES) IMPLANT
DRAPE LAPAROTOMY 100X72X124 (DRAPES) ×2 IMPLANT
DRAPE SURG 17X23 STRL (DRAPES) ×8 IMPLANT
DRSG OPSITE POSTOP 4X10 (GAUZE/BANDAGES/DRESSINGS) ×2 IMPLANT
DRSG OPSITE POSTOP 4X6 (GAUZE/BANDAGES/DRESSINGS) ×2 IMPLANT
DURAPREP 26ML APPLICATOR (WOUND CARE) ×2 IMPLANT
ELECT REM PT RETURN 9FT ADLT (ELECTROSURGICAL) ×2
ELECTRODE REM PT RTRN 9FT ADLT (ELECTROSURGICAL) ×1 IMPLANT
EVACUATOR 1/8 PVC DRAIN (DRAIN) IMPLANT
GAUZE 4X4 16PLY RFD (DISPOSABLE) IMPLANT
GAUZE SPONGE 4X4 12PLY STRL (GAUZE/BANDAGES/DRESSINGS) IMPLANT
GLOVE BIO SURGEON STRL SZ 6.5 (GLOVE) ×6 IMPLANT
GLOVE BIO SURGEON STRL SZ7 (GLOVE) ×8 IMPLANT
GLOVE BIOGEL PI IND STRL 6.5 (GLOVE) ×1 IMPLANT
GLOVE BIOGEL PI IND STRL 7.5 (GLOVE) ×1 IMPLANT
GLOVE BIOGEL PI INDICATOR 6.5 (GLOVE) ×1
GLOVE BIOGEL PI INDICATOR 7.5 (GLOVE) ×1
GLOVE ECLIPSE 9.0 STRL (GLOVE) ×4 IMPLANT
GOWN STRL REUS W/ TWL LRG LVL3 (GOWN DISPOSABLE) IMPLANT
GOWN STRL REUS W/ TWL XL LVL3 (GOWN DISPOSABLE) ×2 IMPLANT
GOWN STRL REUS W/TWL 2XL LVL3 (GOWN DISPOSABLE) IMPLANT
GOWN STRL REUS W/TWL LRG LVL3 (GOWN DISPOSABLE)
GOWN STRL REUS W/TWL XL LVL3 (GOWN DISPOSABLE) ×4
GRAFT BN 5X1XSPNE CVD POST DBM (Bone Implant) ×2 IMPLANT
GRAFT BONE MAGNIFUSE 1X5CM (Bone Implant) ×4 IMPLANT
GRAFT DURAGEN MATRIX 1WX1L (Tissue) ×2 IMPLANT
KIT BASIN OR (CUSTOM PROCEDURE TRAY) ×2 IMPLANT
KIT TURNOVER KIT B (KITS) ×2 IMPLANT
MILL MEDIUM DISP (BLADE) ×2 IMPLANT
NEEDLE HYPO 22GX1.5 SAFETY (NEEDLE) ×2 IMPLANT
NS IRRIG 1000ML POUR BTL (IV SOLUTION) ×2 IMPLANT
OIL CARTRIDGE MAESTRO DRILL (MISCELLANEOUS) ×2
PACK LAMINECTOMY NEURO (CUSTOM PROCEDURE TRAY) ×2 IMPLANT
PLATE ANT XLNK 5.5X58-80 (Plate) ×2 IMPLANT
ROD SPINAL TI CP4 NS 5.5X120 (Rod) ×4 IMPLANT
SCREW MAS 6.5X45 (Screw) ×8 IMPLANT
SCREW SET SOLERA (Screw) ×16 IMPLANT
SCREW SET SOLERA TI5.5 (Screw) ×8 IMPLANT
SCREW SOLERA 6.5X50 (Screw) ×8 IMPLANT
SPONGE SURGIFOAM ABS GEL 100 (HEMOSTASIS) ×2 IMPLANT
STRIP CLOSURE SKIN 1/2X4 (GAUZE/BANDAGES/DRESSINGS) ×4 IMPLANT
SUT NURALON 4 0 TR CR/8 (SUTURE) ×2 IMPLANT
SUT VIC AB 0 CT1 18XCR BRD8 (SUTURE) ×2 IMPLANT
SUT VIC AB 0 CT1 8-18 (SUTURE) ×4
SUT VIC AB 2-0 CT1 18 (SUTURE) ×4 IMPLANT
SUT VIC AB 3-0 FS2 27 (SUTURE) ×2 IMPLANT
SUT VIC AB 3-0 SH 8-18 (SUTURE) ×4 IMPLANT
TOWEL GREEN STERILE (TOWEL DISPOSABLE) ×2 IMPLANT
TOWEL GREEN STERILE FF (TOWEL DISPOSABLE) ×2 IMPLANT
TRAY FOLEY MTR SLVR 16FR STAT (SET/KITS/TRAYS/PACK) ×2 IMPLANT
WATER STERILE IRR 1000ML POUR (IV SOLUTION) ×2 IMPLANT

## 2019-12-19 NOTE — ED Notes (Signed)
Report given to [facility] at this time. ETA 25 min.

## 2019-12-19 NOTE — Anesthesia Postprocedure Evaluation (Signed)
Anesthesia Post Note  Patient: MATAIO MELE  Procedure(s) Performed: Lumbar Three Decompressive Laminectomy with Transpedicular Decompression, Lumbar one - Lumbar five Posterior Lateral Arthrodesis utilizing segmental pedicle screw fixation and local autografting. (N/A Back)     Patient location during evaluation: PACU Anesthesia Type: General Level of consciousness: awake and alert Pain management: pain level controlled Vital Signs Assessment: post-procedure vital signs reviewed and stable Respiratory status: spontaneous breathing, nonlabored ventilation, respiratory function stable and patient connected to nasal cannula oxygen Cardiovascular status: blood pressure returned to baseline and stable Postop Assessment: no apparent nausea or vomiting Anesthetic complications: no   No complications documented.  Last Vitals:  Vitals:   12/19/19 1958 12/19/19 2000  BP: (!) 151/88 (!) 142/91  Pulse: 86 89  Resp: (!) 4 (!) 11  Temp:  37.2 C  SpO2: 99% 99%    Last Pain:  Vitals:   12/19/19 2000  TempSrc: Oral  PainSc: 6                  Kennieth Rad

## 2019-12-19 NOTE — Progress Notes (Signed)
Mr Edelen has been stable and resting comfortabl7y after being medicated for pain. He awakens readily, is oriented x 4 and follows commands/ No changei n his assessment since arrival. He is pending room assignment to be decided by house coverage.

## 2019-12-19 NOTE — Op Note (Signed)
Date of procedure: 12/19/2019  Date of dictation: Same  Service: Neurosurgery  Preoperative diagnosis: L3 burst fracture with cauda equina injury  Postoperative diagnosis: Same  Procedure Name: L3 decompressive laminectomy with transpedicular decompression of the spinal canal for open reduction of his L3 fracture  Repair of traumatic dural laceration  L1-L5 posterior lateral arthrodesis utilizing morselized autograft, morselized allograft, and segmental pedicle screw fixation  Surgeon:Kemuel Buchmann A.Jaimy Kliethermes, M.D.  Asst. Surgeon: Doran Durand, NP  Anesthesia: General  Indication: 54 year old male suffered a fall at work with resultant severe back pain and severe bilateral lower extremity pain with loss of bowel and bladder continence.  Work-up demonstrates evidence of a severe L3 burst fracture with marked retropulsed bone into the canal with severe spinal stenosis.  Patient presents now for decompression and fusion in hopes of improving his symptoms.  Operative note: After induction of anesthesia, patient positioned prone onto bolsters and appropriately padded.  Patient's lumbar region prepped and draped sterilely.  Incision made from L1-L5.  Dissection performed bilaterally.  Retractor placed.  Fluoroscopy used.  Levels confirmed.  Complete laminectomy of L3 was performed.  In the process of performing the laminectomy it was clear that the fractured bone had lacerated the dura and CSF was leaking out.  The dural laceration was circumferentially dissected free.  I oversewed the dural laceration with 4-0 Nurolon and this appeared to achieve watertight closure.  Laminectomy was performed including complete inferior facetectomies of L3 and majority superior facetectomies of L3 bilaterally.  The medial aspect of the pedicle of L3 on the left side was resected to aid in transpedicular decompression.  Epidural venous plexus was coagulated and cut.  The retropulsed bone was then pushed forward into the cavity  between the L2 and L4 vertebral bodies.  Good reduction of the retropulsed bone was achieved.  This was confirmed by fluoroscopy.  There was no evidence of any injury to the ventral dura.  Gelfoam and Surgifoam was placed posterior laterally with good hemostasis.  Pedicles of L1-L2 L4 and L5 were then identified using surface landmarks and intraoperative fluoroscopy superficial bone around the pedicle was then removed using high-speed drill.  Pedicle was then probed using a pedicle all each pedicle all track was then probed and found to be solid within the bone.  Each pedicle tract was then tapped with a screw tap.  Each screw temple was probed and found to be solidly within the bone.  [employer] Solera pedicle screws were then placed bilaterally at L1-L2 L4 and L5 under fluoroscopic guidance.  6.5 millimeters screws were used bilaterally.  Final images reveal good position of the screws at the proper upper level with improved alignment of the spine and improve retropulsion of the L3 fracture.  Short segment titanium rods and placed over the screw heads from L1-L5.  Locking caps then placed over the screws.  Locking caps then engaged with a construct under compression.  DuraGen was placed over the dural repair.  Transverse processes of L1 L2-L3-L4 and L5 were decorticated.  Morselized autograft and allograft packets were packed posterior laterally.  A transverse connector was placed.  Vancomycin powder was placed in deep wound space as was a medium Hemovac drain.  Wound is then closed in layers with Vicryl sutures.  Steri-Strips and sterile dressing were applied.  No apparent complications.  Patient tolerated the procedure well and he returns to the recovery room postop.

## 2019-12-19 NOTE — Brief Op Note (Signed)
12/19/2019  3:27 PM  PATIENT:  Colton Nichols  54 y.o. male  PRE-OPERATIVE DIAGNOSIS:  Lumbar three burst fracture with severe canal compromise  POST-OPERATIVE DIAGNOSIS:  Lumbar three burst fracture with severe canal compromise  PROCEDURE:  Procedure(s): Lumbar Three Decompressive Laminectomy with Transpedicular Decompression, Lumbar one - Lumbar five Posterior Lateral Arthrodesis utilizing segmental pedicle screw fixation and local autografting. (N/A)  SURGEON:  Surgeon(s) and Role:    Julio Sicks, MD - Primary  PHYSICIAN ASSISTANT:   ASSISTANTSMarland Mcalpine   ANESTHESIA:   general  EBL:  400 mL   BLOOD ADMINISTERED:none  DRAINS: (med) Hemovact drain(s) in the epidural space with  Suction Open   LOCAL MEDICATIONS USED:  NONE  SPECIMEN:  No Specimen  DISPOSITION OF SPECIMEN:  N/A  COUNTS:  YES  TOURNIQUET:  * No tourniquets in log *  DICTATION: .Dragon Dictation  PLAN OF CARE: Admit to inpatient   PATIENT DISPOSITION:  PACU - hemodynamically stable.   Delay start of Pharmacological VTE agent (>24hrs) due to surgical blood loss or risk of bleeding: yes

## 2019-12-19 NOTE — Anesthesia Preprocedure Evaluation (Addendum)
Anesthesia Evaluation  Patient identified by MRN, date of birth, ID band Patient awake    Reviewed: Allergy & Precautions, NPO status , Patient's Chart, lab work & pertinent test results  Airway Mallampati: II  TM Distance: >3 FB Neck ROM: Full    Dental  (+) Dental Advisory Given   Pulmonary Current Smoker,    breath sounds clear to auscultation       Cardiovascular negative cardio ROS   Rhythm:Regular Rate:Normal     Neuro/Psych Acute fracture of lumbar spine with cauda equina syndrome    GI/Hepatic negative GI ROS, (+)     substance abuse  alcohol use,   Endo/Other  negative endocrine ROS  Renal/GU negative Renal ROS     Musculoskeletal   Abdominal   Peds  Hematology negative hematology ROS (+)   Anesthesia Other Findings   Reproductive/Obstetrics                            Lab Results  Component Value Date   WBC 17.1 (H) 12/19/2019   HGB 13.8 12/19/2019   HCT 39.8 12/19/2019   MCV 102.6 (H) 12/19/2019   PLT 202 12/19/2019   Lab Results  Component Value Date   CREATININE 1.04 12/19/2019   BUN 14 12/19/2019   NA 137 12/19/2019   K 3.7 12/19/2019   CL 104 12/19/2019   CO2 18 (L) 12/19/2019    Anesthesia Physical Anesthesia Plan  ASA: III and emergent  Anesthesia Plan: General   Post-op Pain Management:    Induction: Intravenous  PONV Risk Score and Plan: 1 and Dexamethasone, Ondansetron and Treatment may vary due to age or medical condition  Airway Management Planned: Oral ETT  Additional Equipment:   Intra-op Plan:   Post-operative Plan: Extubation in OR  Informed Consent: I have reviewed the patients History and Physical, chart, labs and discussed the procedure including the risks, benefits and alternatives for the proposed anesthesia with the patient or authorized representative who has indicated his/her understanding and acceptance.     Dental  advisory given  Plan Discussed with: CRNA  Anesthesia Plan Comments:         Anesthesia Quick Evaluation

## 2019-12-19 NOTE — Progress Notes (Signed)
Orthopedic Tech Progress Note Patient Details:  Colton Nichols [birth year] [id number]  Patient ID: Colton Nichols, male   DOB: Oct 18, 1965, 54 y.o.   MRN: [id number] Applied aspen lumbar brace  Colton Nichols 12/19/2019, 9:08 PM

## 2019-12-19 NOTE — H&P (Signed)
Colton Nichols is an 54 y.o. male.   Chief Complaint: Back pain, L3 fracture HPI: 54 year old male fell while at work today.  Patient landed on his buttocks.  Immediate onset of severe lumbar pain with shooting pains into both lower extremities.  Immediate episode of fecal incontinence.  Patient has remained in severe pain.  No evidence of other injury.  Work-up at outside emergency room demonstrates evidence of a severe L3 burst fracture with marked canal compromise.  No other injury obvious.  Patient reports shooting pain with some intermittent numbness and burning in both lower extremities.  Decreased sensation in his sacral region.  History reviewed. No pertinent past medical history.  Past Surgical History:  Procedure Laterality Date  . LACERATION REPAIR      History reviewed. No pertinent family history. Social History:  reports that he has been smoking cigarettes. He has been smoking about 0.50 packs per day. He has never used smokeless tobacco. He reports current alcohol use. He reports that he does not use drugs.  Allergies: No Known Allergies  Medications Prior to Admission  Medication Sig Dispense Refill  . ibuprofen (ADVIL) 800 MG tablet Take 1 tablet (800 mg total) by mouth 3 (three) times daily. 21 tablet 0  . predniSONE (DELTASONE) 10 MG tablet 6,5,4,3,2,1 taper 21 tablet 0    Results for orders placed or performed during the hospital encounter of 12/19/19 (from the past 48 hour(s))  CBC     Status: Abnormal   Collection Time: 12/19/19  7:54 AM  Result Value Ref Range   WBC 17.1 (H) 4.0 - 10.5 K/uL   RBC 3.88 (L) 4.22 - 5.81 MIL/uL   Hemoglobin 13.8 13.0 - 17.0 g/dL   HCT 23.5 39 - 52 %   MCV 102.6 (H) 80.0 - 100.0 fL   MCH 35.6 (H) 26.0 - 34.0 pg   MCHC 34.7 30.0 - 36.0 g/dL   RDW 57.3 22.0 - 25.4 %   Platelets 202 150 - 400 K/uL   nRBC 0.0 0.0 - 0.2 %    Comment: Performed at [facility], [address] [place], [place] [address]  Basic metabolic panel      Status: Abnormal   Collection Time: 12/19/19  7:54 AM  Result Value Ref Range   Sodium 137 135 - 145 mmol/L   Potassium 3.7 3.5 - 5.1 mmol/L   Chloride 104 98 - 111 mmol/L   CO2 18 (L) 22 - 32 mmol/L   Glucose, Bld 114 (H) 70 - 99 mg/dL    Comment: Glucose reference range applies only to samples taken after fasting for at least 8 hours.   BUN 14 6 - 20 mg/dL   Creatinine, Ser 3.76 0.61 - 1.24 mg/dL   Calcium 9.2 8.9 - 28.3 mg/dL   GFR calc non Af Amer >60 >60 mL/min   GFR calc Af Amer >60 >60 mL/min   Anion gap 15 5 - 15    Comment: Performed at [facility], [address], [place], [place] [address]  Protime-INR     Status: None   Collection Time: 12/19/19  7:54 AM  Result Value Ref Range   Prothrombin Time 12.6 11.4 - 15.2 seconds   INR 1.0 0.8 - 1.2    Comment: (NOTE) INR goal varies based on device and disease states. Performed at [facility], [address], [place], [place] [address]   SARS Coronavirus 2 by RT PCR (hospital order, performed in [facility] hospital lab) Nasopharyngeal Nasopharyngeal Swab  Status: None   Collection Time: 12/19/19  8:46 AM   Specimen: Nasopharyngeal Swab  Result Value Ref Range   SARS Coronavirus 2 NEGATIVE NEGATIVE    Comment: (NOTE) SARS-CoV-2 target nucleic acids are NOT DETECTED.  The SARS-CoV-2 RNA is generally detectable in upper and lower respiratory specimens during the acute phase of infection. The lowest concentration of SARS-CoV-2 viral copies this assay can detect is 250 copies / mL. A negative result does not preclude SARS-CoV-2 infection and should not be used as the sole basis for treatment or other patient management decisions.  A negative result may occur with improper specimen collection / handling, submission of specimen other than nasopharyngeal swab, presence of viral mutation(s) within the areas targeted by this assay, and inadequate number of viral copies (<250 copies / mL). A negative result must be combined  with clinical observations, patient history, and epidemiological information.  Fact Sheet for Patients:   BoilerBrush.com.cy  Fact Sheet for Healthcare Providers: https://pope.com/  This test is not yet approved or  cleared by the [place] FDA and has been authorized for detection and/or diagnosis of SARS-CoV-2 by FDA under an Emergency Use Authorization (EUA).  This EUA will remain in effect (meaning this test can be used) for the duration of the COVID-19 declaration under Section 564(b)(1) of the Act, 21 U.S.C. section 360bbb-3(b)(1), unless the authorization is terminated or revoked sooner.  Performed at [facility], [address] [place], [place] [address]    CT Lumbar Spine Wo Contrast  Result Date: 12/19/2019 CLINICAL DATA:  [name] today with severe back pain and pain radiating down both legs. EXAM: CT LUMBAR SPINE WITHOUT CONTRAST TECHNIQUE: Multidetector CT imaging of the lumbar spine was performed without intravenous contrast administration. Multiplanar CT image reconstructions were also generated. COMPARISON:  Radiography 10/07/2018 FINDINGS: Segmentation: 5 lumbar type vertebral bodies. Alignment: Straightening of the normal lumbar lordosis. Vertebrae: Burst compression fracture of the L3 vertebral body. Retropulsion of the posterior aspect of the vertebral body by distance of 10 mm, markedly encroaching upon the spinal canal, leaving a space of only 3 mm or less in the posterior spinal canal. Pedicles are intact. Both transverse processes are fractured at this level. Spinous process intact. At L4, there is a nondisplaced fracture of both transverse processes. Paraspinal and other soft tissues: Aortic atherosclerosis. Disc levels: L4-5 disc bulge with mild stenosis of both lateral recesses. L5-S1 disc bulge with subarticular lateral recess narrowing left more than right IMPRESSION: Burst compression fracture of the L3 vertebral body  with 1 cm retropulsion and pronounced compromise of the spinal canal, residual space only 3 mm or less. Fracture of both spinous processes at this level as well. Fracture of both spinous processes at L4, nondisplaced. Mild degenerative changes at L4-5 and L5-S1. Aortic Atherosclerosis (ICD10-I70.0). Electronically Signed   By: Paulina Fusi M.D.   On: 12/19/2019 08:36   CT PELVIS WO CONTRAST  Result Date: 12/19/2019 CLINICAL DATA:  54 year old who tripped and fell this morning, landing on his buttocks, presenting with severe low back pain radiating into both LOWER extremities. Patient had an episode of stool incontinence. Initial encounter. EXAM: CT PELVIS WITHOUT CONTRAST TECHNIQUE: Multidetector CT imaging of the pelvis was performed following the standard protocol without intravenous contrast. COMPARISON:  None. FINDINGS: Urinary Tract: Normal appearing urinary bladder. No evidence of ureteral dilation. Bowel: Liquid stool throughout the visualized colon which is normal in appearance. Visualized small bowel unremarkable. Vascular/Lymphatic: Severe aortoiliofemoral atherosclerosis without evidence of aneurysm. Reproductive: Mild to moderate  median lobe prostate gland enlargement. Prosthetic calcifications. Normal seminal vesicles. Other:  None. Musculoskeletal: No evidence of acute fracture. Broad-base central disc protrusion at L4 with borderline to mild multifactorial spinal stenosis at this level. Broad-base central disc protrusion or extrusion at L5 without evidence of spinal stenosis. IMPRESSION: 1. No evidence of acute fracture or dislocation. 2. Broad-base central disc protrusion at L4 with borderline to mild multifactorial spinal stenosis at this level. 3. Broad-base central disc protrusion or extrusion at L5 without evidence of spinal stenosis. 4. Mild to moderate median lobe prostate gland enlargement. Aortic Atherosclerosis (ICD10-I70.0). Electronically Signed   By: Hulan Saas M.D.   On:  12/19/2019 09:25   DG Femur Min 2 Views Right  Result Date: 12/19/2019 CLINICAL DATA:  Fall. Low back and pain shooting down the right leg. EXAM: RIGHT FEMUR 2 VIEWS COMPARISON:  None. FINDINGS: No fracture. There is a sclerotic medullary bone lesion in the distal femoral diaphysis consistent with a benign enchondroma. No other bone lesions. Hip and knee joints are normally spaced and aligned. There are scattered arterial vascular calcifications along the femoral artery. IMPRESSION: 1. No fracture or acute finding.  No joint abnormality. Electronically Signed   By: Amie Portland M.D.   On: 12/19/2019 09:21    Pertinent items noted in HPI and remainder of comprehensive ROS otherwise negative.  Blood pressure (!) 159/97, pulse 82, temperature (!) 97.5 F (36.4 C), temperature source Oral, resp. rate 20, height 6\' 1"  (1.854 m), weight 63.5 kg, SpO2 98 %.  Patient is awake and alert.  He is obviously in a great deal of pain.  Speech is fluent.  Judgment insight are intact.  Cranial nerve function normal bilateral.  Motor examination is upper extremities are normal.  Motor examination his lower extremities are limited by pain although patient appears to have good motor strength in all lower extremity motor groups without obvious deficit.  Patient with no clear-cut sensory deficits within his lower extremities.  Patient with some decrease sensation in his lower sacral nerve root distributions.  Examination head ears eyes nose throat [place] chest and abdomen are benign.  Extremities are free from injury deformity. Assessment/Plan L3 burst fracture with severe canal compromise and likely cauda equina injury which is incomplete.  Plan: L3 decompressive laminectomy with transpedicular decompression followed by L1-L5 posterior lateral arthrodesis utilizing segmental pedicle screw fixation and local autografting.  I discussed the risks and benefits involved with surgery including not limited to risk of  anesthesia, bleeding, infection, CSF leak, nerve root injury, fusion failure, as patient failure, continued pain, nonbenefit.  Patient has been given the option as questions.  Appears understand.  He wishes to proceed with surgery.  A Illyana Schorsch 12/19/2019, 12:07 PM

## 2019-12-19 NOTE — Transfer of Care (Signed)
Immediate Anesthesia Transfer of Care Note  Patient: Colton Nichols  Procedure(s) Performed: Lumbar Three Decompressive Laminectomy with Transpedicular Decompression, Lumbar one - Lumbar five Posterior Lateral Arthrodesis utilizing segmental pedicle screw fixation and local autografting. (N/A Back)  Patient Location: PACU  Anesthesia Type:General  Level of Consciousness: awake, alert  and oriented  Airway & Oxygen Therapy: Patient Spontanous Breathing  Post-op Assessment: Report given to RN and Post -op Vital signs reviewed and stable  Post vital signs: Reviewed and stable  Last Vitals:  Vitals Value Taken Time  BP 178/113 12/19/19 1552  Temp    Pulse 109 12/19/19 1552  Resp 10 12/19/19 1552  SpO2 98 % 12/19/19 1552  Vitals shown include unvalidated device data.  Last Pain:  Vitals:   12/19/19 1032  TempSrc:   PainSc: 9          Complications: No complications documented.

## 2019-12-19 NOTE — ED Triage Notes (Signed)
Pt reports tripped and fell this morning and landed on his buttocks.  Pt says has decreased feeling in r leg, severe pain in lower back radiating down both legs, and was incontinent of stool.  Pt also struck head on something when he fell and has small abrasion>  Denies any loss of consciousness.

## 2019-12-19 NOTE — ED Notes (Signed)
[facility] called to let us know that they are sending a truck to take to [facility] for Surgery.  Nurse informed.

## 2019-12-19 NOTE — ED Notes (Signed)
[facility] in room with patient at this time.

## 2019-12-19 NOTE — Anesthesia Procedure Notes (Signed)
Procedure Name: Intubation Date/Time: 12/19/2019 12:21 PM Performed by: Clearnce Sorrel, CRNA Pre-anesthesia Checklist: Patient identified, Emergency Drugs available, Suction available, Patient being monitored and Timeout performed Patient Re-evaluated:Patient Re-evaluated prior to induction Oxygen Delivery Method: Circle system utilized Preoxygenation: Pre-oxygenation with 100% oxygen Induction Type: IV induction, Rapid sequence and Cricoid Pressure applied Laryngoscope Size: Mac and 4 Grade View: Grade I Tube type: Oral Tube size: 7.5 mm Number of attempts: 1 Airway Equipment and Method: Stylet Placement Confirmation: ETT inserted through vocal cords under direct vision,  positive ETCO2 and breath sounds checked- equal and bilateral Secured at: 23 cm Tube secured with: Tape Dental Injury: Teeth and Oropharynx as per pre-operative assessment

## 2019-12-20 LAB — MRSA PCR SCREENING: MRSA by PCR: NEGATIVE

## 2019-12-20 LAB — BASIC METABOLIC PANEL
Anion gap: 8 (ref 5–15)
BUN: 8 mg/dL (ref 6–20)
CO2: 26 mmol/L (ref 22–32)
Calcium: 8.4 mg/dL — ABNORMAL LOW (ref 8.9–10.3)
Chloride: 102 mmol/L (ref 98–111)
Creatinine, Ser: 1.08 mg/dL (ref 0.61–1.24)
GFR calc Af Amer: >60 mL/min (ref >60)
GFR calc non Af Amer: >60 mL/min (ref >60)
Glucose, Bld: 142 mg/dL — ABNORMAL HIGH (ref 70–99)
Potassium: 4.2 mmol/L (ref 3.5–5.1)
Sodium: 136 mmol/L (ref 135–145)

## 2019-12-20 LAB — CBC
HCT: 28.7 % — ABNORMAL LOW (ref 39.0–52.0)
Hemoglobin: 9.6 g/dL — ABNORMAL LOW (ref 13.0–17.0)
MCH: 34.5 pg — ABNORMAL HIGH (ref 26.0–34.0)
MCHC: 33.4 g/dL (ref 30.0–36.0)
MCV: 103.2 fL — ABNORMAL HIGH (ref 80.0–100.0)
Platelets: 117 10*3/uL — ABNORMAL LOW (ref 150–400)
RBC: 2.78 MIL/uL — ABNORMAL LOW (ref 4.22–5.81)
RDW: 13.8 % (ref 11.5–15.5)
WBC: 13.9 10*3/uL — ABNORMAL HIGH (ref 4.0–10.5)
nRBC: 0 % (ref 0.0–0.2)

## 2019-12-20 LAB — TYPE AND SCREEN
ABO/RH(D): O POS
Antibody Screen: NEGATIVE

## 2019-12-20 LAB — MAGNESIUM: Magnesium: 1.7 mg/dL (ref 1.7–2.4)

## 2019-12-20 MED ORDER — MUSCLE RUB 10-15 % EX CREA
TOPICAL_CREAM | CUTANEOUS | Status: DC | PRN
  Administered 2019-12-20 – 2019-12-21 (×2): 1 via TOPICAL
  Filled 2019-12-20: qty 85

## 2019-12-20 NOTE — Progress Notes (Signed)
Postop day 1.  Patient with quite a bit of incisional pain and some right lower extremity pain.  He notes some intermittent right lower extremity numbness.  He has been able to get out of bed with therapy today and ambulated in the hall.  He is certainly much improved from his presentation yesterday.  Afebrile.  Vital signs are stable.  Drain output moderately high.  Patient is awake and alert.  He is oriented and appropriate.  Motor examination reveals good motor strength bilaterally perhaps with some trace quadriceps and dorsiflexion weakness on his right lower extremity.  Sensory examination with some decrease sensation in his right lower extremity and some diminished sacral sensation.  Wound clean and dry.  Chest and abdomen benign.  Overall progressing well following lumbar decompression and fusion surgery.  Continue efforts at mobilization.  Patient okay to transfer from to floor.

## 2019-12-20 NOTE — Progress Notes (Signed)
Paged neuro surgery MD regarding pt's muscle stiffness and spasms. Pt reports he uses a topical ointment every morning at home and can't walk without it.   MD  Called back and gave verbal orders for topical cream.

## 2019-12-20 NOTE — Evaluation (Signed)
Occupational Therapy Evaluation Patient Details Name: Colton Nichols MRN: [id number] DOB: 05-Apr-1966 Today's Date: 12/20/2019    History of Present Illness Pt is a 54 yo male s/p fall at work L3 burst fracture with cauda equina injury which is incomplete. Pt requiring L3 decompressive laminectomy with transpedicular decompression followed by L1-L5 posterior lateral arthrodesis. PMHx: current smoker.   Clinical Impression   Pt PTA: Pt living in rooming house with 15 steps to get to bedroom/bathroom. Pt was working and reports independence. Pt currently limited by decreased strength, especially in RLE, decreased ability to care for self, decreased endurance for standing tasks. Pt modA +2 for bed mobility and transfers as RLE severely weak and gives out from stand to sit. Pt set-upA to maxA for ADL. Pt heavily reliant on LLE and BUEs for mobility with RW due RLE weakness. Pt would benefit from continued OT skilled services.  OT following acutely.  HR 112 BPM to 133 BPM with exertion. O2 >90% on RA.  * Pt may be able to live with his mother upon d/c per pt.    Follow Up Recommendations  CIR    Equipment Recommendations  3 in 1 bedside commode;Tub/shower seat    Recommendations for Other Services       Precautions / Restrictions Precautions Precautions: Back;Fall Precaution Comments: rt leg buckles with sit to/from stand transition Required Braces or Orthoses: Spinal Brace Spinal Brace: Lumbar corset;Applied in sitting position Restrictions Weight Bearing Restrictions: No      Mobility Bed Mobility Overal bed mobility: Needs Assistance Bed Mobility: Rolling;Sidelying to Sit Rolling: Min assist Sidelying to sit: Mod assist       General bed mobility comments: Assist for log roll technique; pt in pain halfway and modA +2 for trunk elevation as BLEs lowered down  Transfers Overall transfer level: Needs assistance Equipment used: Rolling walker (2 wheeled);4-wheeled  walker Transfers: Sit to/from Stand Sit to Stand: Mod assist;+2 safety/equipment         General transfer comment: Assist for hand placement and power up without posterior lean     Balance Overall balance assessment: Needs assistance Sitting-balance support: Feet supported;Bilateral upper extremity supported Sitting balance-Leahy Scale: Fair Sitting balance - Comments: UE support for pain not balance   Standing balance support: Single extremity supported;During functional activity Standing balance-Leahy Scale: Poor Standing balance comment: UE support due to LE weakness; leaning on counter/chair; use of RUE for grooming task afterset-upA                           ADL either performed or assessed with clinical judgement   ADL Overall ADL's : Needs assistance/impaired Eating/Feeding: Set up;Sitting   Grooming: Minimal assistance;Standing Grooming Details (indicate cue type and reason): minA for standing to wash face/brush teeth Upper Body Bathing: Set up;Sitting   Lower Body Bathing: Maximal assistance;Cueing for safety;Sitting/lateral leans;Sit to/from stand   Upper Body Dressing : Moderate assistance;Sitting Upper Body Dressing Details (indicate cue type and reason): pt requiring assist to donn brace at EOB. Lower Body Dressing: Maximal assistance;Cueing for safety;Sitting/lateral leans;Sit to/from stand Lower Body Dressing Details (indicate cue type and reason): LLE stronger than RLE for figure 4 about halfway; RLE unable to perform Toilet Transfer: Moderate assistance;+2 for physical assistance;+2 for safety/equipment;Ambulation;BSC;RW Toilet Transfer Details (indicate cue type and reason): simulating to recliner; pt unable to sit without abrupt sitting d/t RLE giving out. Toileting- Clothing Manipulation and Hygiene: Moderate assistance;Sitting/lateral lean;Sit to/from stand  Functional mobility during ADLs: Minimal assistance;Cueing for safety;Rolling  walker;Cueing for sequencing General ADL Comments: Pt limited by decreased strength, especially in RLE, decreased ability to care for self, decreased endurance for standing tasks.     Vision Baseline Vision/History: No visual deficits Patient Visual Report: No change from baseline Vision Assessment?: No apparent visual deficits     Perception     Praxis      Pertinent Vitals/Pain Pain Assessment: 0-10 Pain Score: 10-Worst pain ever Pain Location: low back/bottom; B/L LE pain Pain Descriptors / Indicators: Constant;Discomfort;Grimacing;Radiating;Pressure;Throbbing;Shooting Pain Intervention(s): Premedicated before session;Monitored during session     Hand Dominance Right   Extremity/Trunk Assessment Upper Extremity Assessment Upper Extremity Assessment: Generalized weakness RUE Deficits / Details: fair strength 3+/5 MM grade and good grip strength LUE Deficits / Details: fair strength 3+/5 MM grade and good grip strength   Lower Extremity Assessment Lower Extremity Assessment: RLE deficits/detail;Defer to PT evaluation RLE Deficits / Details: very weak; buckles in standing RLE Sensation:  (Pt reports intermittent numbness)   Cervical / Trunk Assessment Cervical / Trunk Assessment: Other exceptions Cervical / Trunk Exceptions: s/p lumbar sx   Communication Communication Communication: No difficulties   Cognition Arousal/Alertness: Awake/alert Behavior During Therapy: WFL for tasks assessed/performed Overall Cognitive Status: Within Functional Limits for tasks assessed                                     General Comments  112 BPM to 133 BPM with exertion. O2 >90% on RA.    Exercises     Shoulder Instructions      Home Living Family/patient expects to be discharged to:: Other (Comment) Living Arrangements: Alone (rooming house) Available Help at Discharge: Family;Available PRN/intermittently Type of Home: House Home Access: Stairs to  enter [employer] of Steps: 3   Home Layout: Multi-level Alternate Level Stairs-Number of Steps: 15 Alternate Level Stairs-Rails: Right Bathroom Shower/Tub: Producer, television/film/video: Standard     Home Equipment: None   Additional Comments: Pt may go to her mother's home; 3 steps in front w/ rail; tub shower      Prior Functioning/Environment Level of Independence: Independent                 OT Problem List: Decreased strength;Decreased activity tolerance;Impaired balance (sitting and/or standing);Decreased safety awareness;Decreased knowledge of use of DME or AE;Decreased knowledge of precautions;Pain;Increased edema;Impaired UE functional use;Decreased range of motion      OT Treatment/Interventions: Self-care/ADL training;Therapeutic exercise;Energy conservation;DME and/or AE instruction;Therapeutic activities;Patient/family education;Balance training    OT Goals(Current goals can be found in the care plan section) Acute Rehab OT Goals Patient Stated Goal: get better OT Goal Formulation: With patient Time For Goal Achievement: 01/03/20 Potential to Achieve Goals: Good ADL Goals Pt Will Perform Upper Body Dressing: with set-up;sitting Pt Will Perform Lower Body Dressing: sitting/lateral leans;sit to/from stand;with min assist Pt Will Transfer to Toilet: with min guard assist;ambulating Pt Will Perform Toileting - Clothing Manipulation and hygiene: with min guard assist;sitting/lateral leans;sit to/from stand Additional ADL Goal #1: Pt will stand x10 mins for OOB ADL with 1 seated rest break in order to increase activity tolerance.  OT Frequency: Min 2X/week   Barriers to D/C:            Co-evaluation PT/OT/SLP Co-Evaluation/Treatment: Yes Reason for Co-Treatment: Complexity of the patient's impairments (multi-system involvement);To address functional/ADL transfers;For patient/therapist safety   OT goals addressed  during session: ADL's and  self-care      AM-PAC OT "6 Clicks" Daily Activity     Outcome Measure Help from another person eating meals?: None Help from another person taking care of personal grooming?: A Little Help from another person toileting, which includes using toliet, bedpan, or urinal?: A Lot Help from another person bathing (including washing, rinsing, drying)?: A Lot Help from another person to put on and taking off regular upper body clothing?: A Lot Help from another person to put on and taking off regular lower body clothing?: A Lot 6 Click Score: 15   End of Session Equipment Utilized During Treatment: Gait belt;Rolling walker Nurse Communication: Mobility status  Activity Tolerance: Patient limited by pain;Patient tolerated treatment well Patient left: in chair;with call bell/phone within reach;with chair alarm set  OT Visit Diagnosis: Unsteadiness on feet (R26.81);Muscle weakness (generalized) (M62.81);Pain Pain - part of body:  (back)                Time: 9741-6384 OT Time Calculation (min): 42 min Charges:  OT General Charges $OT Visit: 1 Visit OT Evaluation $OT Eval Moderate Complexity: 1 Mod  Flora Lipps, OTR/L Acute Rehabilitation Services Pager: [phone] Office: [phone]   Donya Hitch C 12/20/2019, 3:38 PM

## 2019-12-20 NOTE — Evaluation (Addendum)
Physical Therapy Evaluation Patient Details Name: Colton Nichols MRN: [id number] DOB: 11-Jul-1965 Today's Date: 12/20/2019   History of Present Illness  Pt is a 54 yo male s/p fall at work L3 burst fracture with cauda equina injury which is incomplete. Pt requiring L3 decompressive laminectomy with transpedicular decompression followed by L1-L5 posterior lateral arthrodesis. PMHx: current smoker.  Clinical Impression  Pt presents to PT with decr mobility due to LE weakness after SCI. RLE buckles with full weight if not hyperextended. So transitional movements of sit to stand are very high fall risk. Expect pt to make steady progress. Pt very motivated. Reports he likely will stay with his mother at [place]. Recommend CIR at this time.     Follow Up Recommendations CIR    Equipment Recommendations  Rolling walker with 5" wheels;3in1 (PT)    Recommendations for Other Services       Precautions / Restrictions Precautions Precautions: Back;Fall Precaution Comments: rt leg buckles with sit to/from stand transition Required Braces or Orthoses: Spinal Brace Spinal Brace: Lumbar corset;Applied in sitting position Restrictions Weight Bearing Restrictions: No      Mobility  Bed Mobility Overal bed mobility: Needs Assistance Bed Mobility: Rolling;Sidelying to Sit Rolling: Min assist Sidelying to sit: Mod assist       General bed mobility comments: Assist to bring hips/shoulder over. Assist to elevate trunk into sitting.   Transfers Overall transfer level: Needs assistance Equipment used: Rolling walker (2 wheeled);4-wheeled walker Transfers: Sit to/from Stand Sit to Stand: Mod assist;+2 safety/equipment         General transfer comment: Assist to bring hips up into standing and assist to control descent due to rt knee buckling  Ambulation/Gait Ambulation/Gait assistance: Min assist Gait Distance (Feet): 150 Feet Assistive device: Rolling walker (2 wheeled) Gait  Pattern/deviations: Step-through pattern;Decreased step length - right;Decreased step length - left;Trunk flexed Gait velocity: decr Gait velocity interpretation: <1.31 ft/sec, indicative of household ambulator General Gait Details: Pt with heavy reliance of UE's on walker to support weak rt leg. Rt knee with hyperextension.   Stairs            Wheelchair Mobility    Modified Rankin (Stroke Patients Only)       Balance Overall balance assessment: Needs assistance Sitting-balance support: Feet supported;Bilateral upper extremity supported Sitting balance-Leahy Scale: Fair Sitting balance - Comments: UE support for pain not balance   Standing balance support: Single extremity supported;During functional activity Standing balance-Leahy Scale: Poor Standing balance comment: UE support due to LE weakness                             Pertinent Vitals/Pain Pain Assessment: 0-10 Pain Score: 10-Worst pain ever Pain Location: low back/bottom; B/L LE pain Pain Descriptors / Indicators: Constant;Discomfort;Grimacing;Radiating;Pressure;Throbbing;Shooting Pain Intervention(s): Premedicated before session;Monitored during session    Home Living Family/patient expects to be discharged to:: Other (Comment) Living Arrangements: Alone (rooming house) Available Help at Discharge: Family;Available PRN/intermittently Type of Home: House Home Access: Stairs to enter   [employer] of Steps: 3 Home Layout: Multi-level Home Equipment: None Additional Comments: Pt may go to her mother's home; 3 steps rail; tub shower    Prior Function Level of Independence: Independent               Hand Dominance   Dominant Hand: Right    Extremity/Trunk Assessment   Upper Extremity Assessment Upper Extremity Assessment: Defer to OT evaluation    Lower  Extremity Assessment Lower Extremity Assessment: RLE deficits/detail;LLE deficits/detail RLE Deficits / Details:  strength grossly 2+/5 RLE Sensation:  (Pt reports intermittent numbness) LLE Deficits / Details: hip flex 3-/5, knee and ankle 3+/5       Communication   Communication: No difficulties  Cognition Arousal/Alertness: Awake/alert Behavior During Therapy: WFL for tasks assessed/performed Overall Cognitive Status: Within Functional Limits for tasks assessed                                        General Comments General comments (skin integrity, edema, etc.): HR 120's-130's with activity    Exercises     Assessment/Plan    PT Assessment Patient needs continued PT services  PT Problem List Decreased strength;Decreased balance;Decreased mobility;Decreased knowledge of precautions       PT Treatment Interventions DME instruction;Gait training;Stair training;Functional mobility training;Therapeutic activities;Therapeutic exercise;Balance training;Patient/family education;Neuromuscular re-education    PT Goals (Current goals can be found in the Care Plan section)  Acute Rehab PT Goals Patient Stated Goal: get better PT Goal Formulation: With patient Time For Goal Achievement: 01/03/20 Potential to Achieve Goals: Good    Frequency Min 3X/week   Barriers to discharge        Co-evaluation PT/OT/SLP Co-Evaluation/Treatment: Yes Reason for Co-Treatment: For patient/therapist safety           AM-PAC PT "6 Clicks" Mobility  Outcome Measure Help needed turning from your back to your side while in a flat bed without using bedrails?: A Lot Help needed moving from lying on your back to sitting on the side of a flat bed without using bedrails?: A Lot Help needed moving to and from a bed to a chair (including a wheelchair)?: A Lot Help needed standing up from a chair using your arms (e.g., wheelchair or bedside chair)?: A Lot Help needed to walk in hospital room?: A Little Help needed climbing 3-5 steps with a railing? : Total 6 Click Score: 12    End of Session  Equipment Utilized During Treatment: Gait belt Activity Tolerance: Patient tolerated treatment well Patient left: in chair;with call bell/phone within reach;with chair alarm set Nurse Communication: Mobility status PT Visit Diagnosis: Other abnormalities of gait and mobility (R26.89);Other symptoms and signs involving the nervous system (R29.898);Difficulty in walking, not elsewhere classified (R26.2)    Time: 2993-7169 PT Time Calculation (min) (ACUTE ONLY): 42 min   Charges:   PT Evaluation $PT Eval Moderate Complexity: 1 Mod PT Treatments $Gait Training: 8-22 mins        [facility] PT Acute Rehabilitation Services Pager [phone] Office [phone]   Angelina Ok [facility] 12/20/2019, 11:03 AM

## 2019-12-21 ENCOUNTER — Encounter (HOSPITAL_COMMUNITY): Payer: Self-pay | Admitting: Neurosurgery

## 2019-12-21 DIAGNOSIS — S32001B Stable burst fracture of unspecified lumbar vertebra, initial encounter for open fracture: Secondary | ICD-10-CM

## 2019-12-21 LAB — BASIC METABOLIC PANEL
Anion gap: 9 (ref 5–15)
BUN: 6 mg/dL (ref 6–20)
CO2: 26 mmol/L (ref 22–32)
Calcium: 8.7 mg/dL — ABNORMAL LOW (ref 8.9–10.3)
Chloride: 99 mmol/L (ref 98–111)
Creatinine, Ser: 1.03 mg/dL (ref 0.61–1.24)
GFR calc Af Amer: >60 mL/min (ref >60)
GFR calc non Af Amer: >60 mL/min (ref >60)
Glucose, Bld: 161 mg/dL — ABNORMAL HIGH (ref 70–99)
Potassium: 3.7 mmol/L (ref 3.5–5.1)
Sodium: 134 mmol/L — ABNORMAL LOW (ref 135–145)

## 2019-12-21 LAB — CBC
HCT: 27.2 % — ABNORMAL LOW (ref 39.0–52.0)
Hemoglobin: 9.2 g/dL — ABNORMAL LOW (ref 13.0–17.0)
MCH: 34.8 pg — ABNORMAL HIGH (ref 26.0–34.0)
MCHC: 33.8 g/dL (ref 30.0–36.0)
MCV: 103 fL — ABNORMAL HIGH (ref 80.0–100.0)
Platelets: 112 10*3/uL — ABNORMAL LOW (ref 150–400)
RBC: 2.64 MIL/uL — ABNORMAL LOW (ref 4.22–5.81)
RDW: 13.6 % (ref 11.5–15.5)
WBC: 12.8 10*3/uL — ABNORMAL HIGH (ref 4.0–10.5)
nRBC: 0 % (ref 0.0–0.2)

## 2019-12-21 MED ORDER — NICOTINE 21 MG/24HR TD PT24
21.0000 mg | MEDICATED_PATCH | Freq: Every day | TRANSDERMAL | Status: DC
  Administered 2019-12-21 – 2019-12-22 (×2): 21 mg via TRANSDERMAL
  Filled 2019-12-21 (×2): qty 1

## 2019-12-21 MED ORDER — BISACODYL 5 MG PO TBEC
10.0000 mg | DELAYED_RELEASE_TABLET | Freq: Once | ORAL | Status: AC
  Administered 2019-12-21: 10 mg via ORAL
  Filled 2019-12-21: qty 2

## 2019-12-21 MED FILL — Thrombin For Soln 5000 Unit: CUTANEOUS | Qty: 5000 | Status: AC

## 2019-12-21 NOTE — Consult Note (Signed)
Physical Medicine and Rehabilitation Consult   Reason for Consult: Cauda equina syndrome.  Referring Physician: Dr. Jordan Likes   HPI: Colton Nichols is a 54 y.o. male with who fell at work on 07/31, struck the back of his head then landed on his buttocks and developed severe low back pain radiating into BLE as well as fecal incontinence. He was found to have L3 burst fracture with 1 cm retropulsion with pronounced canal compromise, fracture of both spinal processes and fractures of bilateral L4 spinous processes. He was evaluated by Dr. Jordan Likes and had mild sensory deficits  L4/5 and S1 dermatomes with cauda equina symptoms. He was taken to OR emergently for L3 decompressive laminectomy with decompression of spinal canal for open reduction of L3 fracture the same day. Post op therapy evaluations showed RLE>LLE weakness affecting ADLs and mobility. CIR recommended due to functional decline.    Review of Systems  Constitutional: Negative for chills and fever.  HENT: Negative for hearing loss and tinnitus.   Eyes: Negative for blurred vision and double vision.  Respiratory: Negative for cough and shortness of breath.   Cardiovascular: Negative for chest pain and palpitations.  Gastrointestinal: Positive for constipation (has not had BM since admission). Negative for heartburn and nausea.  Genitourinary: Negative for dysuria, frequency and urgency.  Musculoskeletal: Positive for back pain, joint pain and myalgias.       Muscle spasms as well as right thigh pain and back spasms with movement  Skin: Negative for itching and rash.  Neurological: Positive for sensory change (RLE>LLE from knees down) and weakness. Negative for dizziness and headaches.  Psychiatric/Behavioral: The patient is not nervous/anxious and does not have insomnia.      History reviewed. No pertinent past medical history.    Past Surgical History:  Procedure Laterality Date  . LACERATION REPAIR      Family History   Adopted: Yes     Social History:  Single. 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 Was laid off from [employer] 2 months ago.  He reports that he has been smoking cigarettes. He has been smoking about 0.50 packs per day. He has never used smokeless tobacco. He reports current alcohol use--2 beers/day. He reports that he does not use drugs.    Allergies: No Known Allergies    Medications Prior to Admission  Medication Sig Dispense Refill  . ibuprofen (ADVIL) 800 MG tablet Take 1 tablet (800 mg total) by mouth 3 (three) times daily. (Patient not taking: Reported on 12/19/2019) 21 tablet 0  . predniSONE (DELTASONE) 10 MG tablet 6,5,4,3,2,1 taper (Patient not taking: Reported on 12/19/2019) 21 tablet 0    Home: Home Living Family/patient expects to be discharged to:: Other (Comment) Living Arrangements: Alone (rooming house) Available Help at Discharge: Family, Available PRN/intermittently Type of Home: House Home Access: Stairs to enter Secretary/administrator of Steps: 3 Home Layout: Multi-level Alternate Level Stairs-Number of Steps: 15 Alternate Level Stairs-Rails: Right Bathroom Shower/Tub: Health visitor: Standard Home Equipment: None Additional Comments: Pt may go to her mother's home; 3 steps in front w/ rail; tub shower  Functional History: Prior Function Level of Independence: Independent Functional Status:  Mobility: Bed Mobility Overal bed mobility: Needs Assistance Bed Mobility: Rolling, Sidelying to Sit Rolling: Min assist Sidelying to sit: Mod assist General bed mobility comments: Assist for log roll technique; pt in pain halfway and modA +2 for trunk elevation as  BLEs lowered down Transfers Overall transfer level: Needs assistance Equipment used: Rolling walker (2 wheeled), 4-wheeled walker Transfers: Sit to/from Stand Sit to Stand: Mod assist, +2 safety/equipment General transfer comment: Assist for  hand placement and power up without posterior lean  Ambulation/Gait Ambulation/Gait assistance: Min assist Gait Distance (Feet): 150 Feet Assistive device: Rolling walker (2 wheeled) Gait Pattern/deviations: Step-through pattern, Decreased step length - right, Decreased step length - left, Trunk flexed General Gait Details: Pt with heavy reliance of UE's on walker to support weak rt leg. Rt knee with hyperextension.  Gait velocity: decr Gait velocity interpretation: <1.31 ft/sec, indicative of household ambulator    ADL: ADL Overall ADL's : Needs assistance/impaired Eating/Feeding: Set up, Sitting Grooming: Minimal assistance, Standing Grooming Details (indicate cue type and reason): minA for standing to wash face/brush teeth Upper Body Bathing: Set up, Sitting Lower Body Bathing: Maximal assistance, Cueing for safety, Sitting/lateral leans, Sit to/from stand Upper Body Dressing : Moderate assistance, Sitting Upper Body Dressing Details (indicate cue type and reason): pt requiring assist to donn brace at EOB. Lower Body Dressing: Maximal assistance, Cueing for safety, Sitting/lateral leans, Sit to/from stand Lower Body Dressing Details (indicate cue type and reason): LLE stronger than RLE for figure 4 about halfway; RLE unable to perform Toilet Transfer: Moderate assistance, +2 for physical assistance, +2 for safety/equipment, Ambulation, BSC, RW Toilet Transfer Details (indicate cue type and reason): simulating to recliner; pt unable to sit without abrupt sitting d/t RLE giving out. Toileting- Clothing Manipulation and Hygiene: Moderate assistance, Sitting/lateral lean, Sit to/from stand Functional mobility during ADLs: Minimal assistance, Cueing for safety, Rolling walker, Cueing for sequencing General ADL Comments: Pt limited by decreased strength, especially in RLE, decreased ability to care for self, decreased endurance for standing tasks.  Cognition: Cognition Overall Cognitive  Status: Within Functional Limits for tasks assessed Orientation Level: Oriented X4 Cognition Arousal/Alertness: Awake/alert Behavior During Therapy: WFL for tasks assessed/performed Overall Cognitive Status: Within Functional Limits for tasks assessed  Blood pressure 113/82, pulse 93, temperature 98.7 F (37.1 C), resp. rate 15, height 6\' 1"  (1.854 m), weight 58.2 kg, SpO2 100 %. Physical Exam  General: Alert and oriented x 3, No apparent distress HEENT: Head is normocephalic, atraumatic, PERRLA, EOMI, sclera anicteric, oral mucosa pink and moist, dentition intact, ext ear canals clear,  Neck: Supple without JVD or lymphadenopathy Cardiovascular:     Rate and Rhythm: Tachycardia present.  Pulmonary:     Effort: Pulmonary effort is normal.     Comments: Back pain with deep breaths Musculoskeletal:        General: No swelling.  Neurological:     Mental Status: He is oriented to person, place, and time.     Sensory: Sensory deficit present.     Comments: Speech clear. Able to follow commands without difficulty. Back pain with LLE SLR. RLE  Significantly limited by pain--weak DF and unable to extend at knee with hip/thigh pain.  Decreased sensation below knee in the right leg.    Results for orders placed or performed during the hospital encounter of 12/19/19 (from the past 24 hour(s))  CBC     Status: Abnormal   Collection Time: 12/21/19  8:34 AM  Result Value Ref Range   WBC 12.8 (H) 4.0 - 10.5 K/uL   RBC 2.64 (L) 4.22 - 5.81 MIL/uL   Hemoglobin 9.2 (L) 13.0 - 17.0 g/dL   HCT 16.127.2 (L) 39 - 52 %   MCV 103.0 (H) 80.0 - 100.0 fL   MCH  34.8 (H) 26.0 - 34.0 pg   MCHC 33.8 30.0 - 36.0 g/dL   RDW 71.2 45.8 - 09.9 %   Platelets 112 (L) 150 - 400 K/uL   nRBC 0.0 0.0 - 0.2 %  Basic metabolic panel     Status: Abnormal   Collection Time: 12/21/19  8:34 AM  Result Value Ref Range   Sodium 134 (L) 135 - 145 mmol/L   Potassium 3.7 3.5 - 5.1 mmol/L   Chloride 99 98 - 111 mmol/L   CO2 26  22 - 32 mmol/L   Glucose, Bld 161 (H) 70 - 99 mg/dL   BUN 6 6 - 20 mg/dL   Creatinine, Ser 8.33 0.61 - 1.24 mg/dL   Calcium 8.7 (L) 8.9 - 10.3 mg/dL   GFR calc non Af Amer >60 >60 mL/min   GFR calc Af Amer >60 >60 mL/min   Anion gap 9 5 - 15   DG Lumbar Spine 2-3 Views  Result Date: 12/19/2019 CLINICAL DATA:  L1 through L5 fusion. EXAM: DG C-ARM 1-60 MIN; LUMBAR SPINE - 2-3 VIEW FLUOROSCOPY TIME:  Fluoroscopy Time:  53 seconds Radiation Exposure Index (if provided by the fluoroscopic device): 15.19 mGy Number of Acquired Spot Images: 5 COMPARISON:  CT earlier this day. FINDINGS: Five fluoroscopic spot views of the lumbar spine obtained in the operating room in frontal and lateral projections. Intrapedicular screws at L1, L2, L4, and L5. Fluoroscopy time as described above. IMPRESSION: Intraoperative fluoroscopy during lumbar surgery. Electronically Signed   By: Narda Rutherford M.D.   On: 12/19/2019 15:58   DG C-Arm 1-60 Min  Result Date: 12/19/2019 CLINICAL DATA:  L1 through L5 fusion. EXAM: DG C-ARM 1-60 MIN; LUMBAR SPINE - 2-3 VIEW FLUOROSCOPY TIME:  Fluoroscopy Time:  53 seconds Radiation Exposure Index (if provided by the fluoroscopic device): 15.19 mGy Number of Acquired Spot Images: 5 COMPARISON:  CT earlier this day. FINDINGS: Five fluoroscopic spot views of the lumbar spine obtained in the operating room in frontal and lateral projections. Intrapedicular screws at L1, L2, L4, and L5. Fluoroscopy time as described above. IMPRESSION: Intraoperative fluoroscopy during lumbar surgery. Electronically Signed   By: Narda Rutherford M.D.   On: 12/19/2019 15:58     Assessment/Plan: Diagnosis: L3 burst fracture s/p L3 decompressive laminectomy with decompression of the spinal cord 1. Does the need for close, 24 hr/day medical supervision in concert with the patient's rehab needs make it unreasonable for this patient to be served in a less intensive setting? Yes 2. Co-Morbidities requiring  supervision/potential complications: active smoker, s/p fall, active alcohol user, RLE pain, lower back pain 3. Due to bladder management, bowel management, safety, skin/wound care, disease management, medication administration, pain management and patient education, does the patient require 24 hr/day rehab nursing? Yes 4. Does the patient require coordinated care of a physician, rehab nurse, therapy disciplines of PT, OT to address physical and functional deficits in the context of the above medical diagnosis(es)? Yes Addressing deficits in the following areas: balance, endurance, locomotion, strength, transferring, bowel/bladder control, bathing, dressing, feeding, grooming, toileting and psychosocial support 5. Can the patient actively participate in an intensive therapy program of at least 3 hrs of therapy per day at least 5 days per week? Yes 6. The potential for patient to make measurable gains while on inpatient rehab is excellent 7. Anticipated functional outcomes upon discharge from inpatient rehab are min assist  with PT, min assist with OT, independent with SLP. 8. Estimated rehab length of stay  to reach the above functional goals is: 20-22 days 9. Anticipated discharge destination: Home 10. Overall Rehab/Functional Prognosis: excellent  RECOMMENDATIONS: This patient's condition is appropriate for continued rehabilitative care in the following setting: CIR Patient has agreed to participate in recommended program. Yes Note that insurance prior authorization may be required for reimbursement for recommended care.  Comment: Mr. Willhoite has significant rehabilitation needs and would be a good CIR candidate if appropriate disposition can be determined (he does not have family support), and if he is agreeable to cost (he does not have insurance), and once medically stable (currently requiring IV dilaudid). He is moving his bowels and bladder. He would like resources to help him find a job again  eventually- he has a great attitude and is very hard working. His pain is well controlled with current regimen.   Jacquelynn Cree, PA-C 12/21/2019   I have personally performed a face to face diagnostic evaluation, including, but not limited to relevant history and physical exam findings, of this patient and developed relevant assessment and plan.  Additionally, I have reviewed and concur with the physician assistant's documentation above.  Gwyneth Revels, MD

## 2019-12-21 NOTE — Progress Notes (Signed)
Postop day 2.  Overall making good recovery.  Back pain well controlled.  Right lower extremity pain has resolved.  His sensation is improving both legs.  He is moving more freely.  His catheter was removed this morning he has not voided yet.  Awake and alert.  Oriented and appropriate.  Motor and sensory function with some mild weakness in both distal lower extremities but overall significantly improved from preop.  Sensor examination with some mild sensory loss in his right L4-L5 and S1 dermatomes.  Wound clean and dry.  Chest and abdomen benign.  Drain output still moderately high.  Overall progressing well.  Continue efforts to transfer to the floor.  We'll get rehab consult.

## 2019-12-21 NOTE — TOC Initial Note (Signed)
Transition of Care [facility]) - Initial/Assessment Note    Patient Details  Name: Colton Nichols MRN: [id number] Date of Birth: 30-Oct-1965  Transition of Care [facility]) CM/SW Contact:    Epifanio Lesches, RN Phone Number: 12/21/2019, 10:53 AM  Clinical Narrative:                 Pt s/p fall @ work, suffered  L3 burst fracture with retropulsion with canal compromise, fracture of both spinal processes and fractures of bilateral L4.      - s/p L1-L5 posterior lateral arthrodesis, 7/31  NCM spoke with pt @ bedside regarding TOC needs, d/c planning. Pt states prior to admission was independent and required no DME usage.  Prior to hospitalization lived @ a rooming house in [place]. States has made attempts to speak with landlord since hospital stay, awaiting call back. States unsure if he will be able to return to rooming house  once d/c. Pt with PCP, jobless ( primary job ended 2 months)  ... limited income. 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 NCM shared [facility]... pt interested . Post hospital f/u appointment made, refer to AVS.  Referral made with financial counselor for assessment , pt without insurance. States has mom/family in [place] , unsure of support if needed.   TOC team will continue to monitor and follow for needs  ....  Expected Discharge Plan: IP Rehab Facility Barriers to Discharge: Continued Medical Work up   Patient Goals and CMS Choice        Expected Discharge Plan and Services Expected Discharge Plan: IP Rehab Facility                                              Prior Living Arrangements/Services                       Activities of Daily Living      Permission Sought/Granted                  Emotional Assessment              Admission diagnosis:  Burst fracture of lumbar vertebra (HCC) [S32.001A] Surgery follow-up [Z09] Burst fracture of lumbar vertebra, closed, initial  encounter (HCC) [S32.001A] Patient Active Problem List   Diagnosis Date Noted   Burst fracture of lumbar vertebra (HCC) 12/19/2019   Surgery follow-up 12/19/2019   Burst fracture of lumbar vertebra, closed, initial encounter (HCC) 12/19/2019   PCP:  Patient, No Pcp Per Pharmacy:   [id number] - [place], [place] - [address] [place] [address] Phone: [phone] Fax: [phone]     Social Determinants of Health (SDOH) Interventions    Readmission Risk Interventions No flowsheet data found.

## 2019-12-21 NOTE — Progress Notes (Signed)
Patient complaining of stomach pain and states he needs to have a BM as it has been a few days since he had a good BM.  Laxative given with 240 mL of prune juice. Miralax given this afternoon as well (See MAR).

## 2019-12-21 NOTE — Progress Notes (Signed)
Patient having concerns regarding managing bills and worker's comp post-hospitalization.  RN discussed with case manager on rounds regarding patients needs. Case manager states he will likely need to apply for medicare. Case manager will follow up with him.  RN will continue to monitor patient.

## 2019-12-22 ENCOUNTER — Encounter (HOSPITAL_COMMUNITY): Payer: Self-pay | Admitting: Neurosurgery

## 2019-12-22 ENCOUNTER — Other Ambulatory Visit: Payer: Self-pay

## 2019-12-22 ENCOUNTER — Inpatient Hospital Stay (HOSPITAL_COMMUNITY)
Admission: RE | Admit: 2019-12-22 | Discharge: 2020-01-07 | DRG: 074 | Disposition: A | Discharge status: Home Health | Payer: Medicaid Other | Source: Intra-hospital | Attending: Physical Medicine and Rehabilitation | Admitting: Physical Medicine and Rehabilitation

## 2019-12-22 DIAGNOSIS — G4700 Insomnia, unspecified: Secondary | ICD-10-CM | POA: Diagnosis present

## 2019-12-22 DIAGNOSIS — S32031D Stable burst fracture of third lumbar vertebra, subsequent encounter for fracture with routine healing: Secondary | ICD-10-CM | POA: Diagnosis not present

## 2019-12-22 DIAGNOSIS — F17210 Nicotine dependence, cigarettes, uncomplicated: Secondary | ICD-10-CM | POA: Diagnosis present

## 2019-12-22 DIAGNOSIS — M62838 Other muscle spasm: Secondary | ICD-10-CM | POA: Diagnosis present

## 2019-12-22 DIAGNOSIS — K5900 Constipation, unspecified: Secondary | ICD-10-CM | POA: Diagnosis present

## 2019-12-22 DIAGNOSIS — W1839XD Other fall on same level, subsequent encounter: Secondary | ICD-10-CM | POA: Diagnosis not present

## 2019-12-22 DIAGNOSIS — G629 Polyneuropathy, unspecified: Secondary | ICD-10-CM | POA: Diagnosis present

## 2019-12-22 DIAGNOSIS — D696 Thrombocytopenia, unspecified: Secondary | ICD-10-CM | POA: Diagnosis present

## 2019-12-22 DIAGNOSIS — E871 Hypo-osmolality and hyponatremia: Secondary | ICD-10-CM | POA: Diagnosis present

## 2019-12-22 DIAGNOSIS — Z681 Body mass index (BMI) 19 or less, adult: Secondary | ICD-10-CM

## 2019-12-22 DIAGNOSIS — Z79899 Other long term (current) drug therapy: Secondary | ICD-10-CM | POA: Diagnosis not present

## 2019-12-22 DIAGNOSIS — D62 Acute posthemorrhagic anemia: Secondary | ICD-10-CM | POA: Diagnosis present

## 2019-12-22 DIAGNOSIS — R739 Hyperglycemia, unspecified: Secondary | ICD-10-CM | POA: Diagnosis present

## 2019-12-22 DIAGNOSIS — E440 Moderate protein-calorie malnutrition: Secondary | ICD-10-CM | POA: Diagnosis present

## 2019-12-22 DIAGNOSIS — Z23 Encounter for immunization: Secondary | ICD-10-CM | POA: Diagnosis not present

## 2019-12-22 DIAGNOSIS — S32001A Stable burst fracture of unspecified lumbar vertebra, initial encounter for closed fracture: Secondary | ICD-10-CM | POA: Diagnosis present

## 2019-12-22 DIAGNOSIS — R519 Headache, unspecified: Secondary | ICD-10-CM | POA: Diagnosis not present

## 2019-12-22 DIAGNOSIS — Z7289 Other problems related to lifestyle: Secondary | ICD-10-CM

## 2019-12-22 DIAGNOSIS — I10 Essential (primary) hypertension: Secondary | ICD-10-CM | POA: Diagnosis present

## 2019-12-22 DIAGNOSIS — D72829 Elevated white blood cell count, unspecified: Secondary | ICD-10-CM | POA: Diagnosis present

## 2019-12-22 DIAGNOSIS — G834 Cauda equina syndrome: Secondary | ICD-10-CM | POA: Diagnosis present

## 2019-12-22 MED ORDER — ACETAMINOPHEN 325 MG PO TABS: 650.0000 mg | ORAL_TABLET | ORAL | 0 refills | Status: DC | PRN

## 2019-12-22 MED ORDER — PROCHLORPERAZINE EDISYLATE 10 MG/2ML IJ SOLN: 5.0000 mg | Freq: Four times a day (QID) | INTRAMUSCULAR | Status: DC | PRN

## 2019-12-22 MED ORDER — ACETAMINOPHEN 325 MG PO TABS
325.0000 mg | ORAL_TABLET | ORAL | Status: DC | PRN
  Filled 2019-12-22 (×2): qty 2

## 2019-12-22 MED ORDER — PROCHLORPERAZINE 25 MG RE SUPP: 12.5000 mg | Freq: Four times a day (QID) | RECTAL | Status: DC | PRN

## 2019-12-22 MED ORDER — NICOTINE 21 MG/24HR TD PT24
21.0000 mg | MEDICATED_PATCH | Freq: Every day | TRANSDERMAL | Status: DC
  Administered 2019-12-23 – 2020-01-07 (×16): 21 mg via TRANSDERMAL
  Filled 2019-12-22 (×16): qty 1

## 2019-12-22 MED ORDER — NICOTINE 21 MG/24HR TD PT24: 21.0000 mg | MEDICATED_PATCH | Freq: Every day | TRANSDERMAL | 0 refills | Status: DC

## 2019-12-22 MED ORDER — ONDANSETRON HCL 4 MG PO TABS: 4.0000 mg | ORAL_TABLET | Freq: Four times a day (QID) | ORAL | 0 refills | Status: DC | PRN

## 2019-12-22 MED ORDER — OXYCODONE HCL ER 10 MG PO T12A
10.0000 mg | EXTENDED_RELEASE_TABLET | Freq: Two times a day (BID) | ORAL | Status: DC
  Administered 2019-12-23 – 2020-01-07 (×31): 10 mg via ORAL
  Filled 2019-12-22 (×32): qty 1

## 2019-12-22 MED ORDER — ALUM & MAG HYDROXIDE-SIMETH 200-200-20 MG/5ML PO SUSP: 30.0000 mL | ORAL | Status: DC | PRN

## 2019-12-22 MED ORDER — PROCHLORPERAZINE MALEATE 5 MG PO TABS: 5.0000 mg | ORAL_TABLET | Freq: Four times a day (QID) | ORAL | Status: DC | PRN

## 2019-12-22 MED ORDER — MUSCLE RUB 10-15 % EX CREA: 1.0000 "application " | TOPICAL_CREAM | CUTANEOUS | 0 refills | Status: DC | PRN

## 2019-12-22 MED ORDER — DIPHENHYDRAMINE HCL 12.5 MG/5ML PO ELIX: 12.5000 mg | ORAL_SOLUTION | Freq: Four times a day (QID) | ORAL | Status: DC | PRN

## 2019-12-22 MED ORDER — OXYCODONE HCL 10 MG PO TABS: 10.0000 mg | ORAL_TABLET | ORAL | 0 refills | Status: DC | PRN

## 2019-12-22 MED ORDER — HYDROCODONE-ACETAMINOPHEN 10-325 MG PO TABS
1.0000 | ORAL_TABLET | ORAL | Status: DC | PRN
  Administered 2019-12-25 – 2020-01-01 (×3): 1 via ORAL
  Filled 2019-12-22 (×4): qty 1

## 2019-12-22 MED ORDER — PHENOL 1.4 % MT LIQD: 1.0000 | OROMUCOSAL | Status: DC | PRN

## 2019-12-22 MED ORDER — MENTHOL 3 MG MT LOZG: 1.0000 | LOZENGE | OROMUCOSAL | Status: DC | PRN

## 2019-12-22 MED ORDER — GUAIFENESIN-DM 100-10 MG/5ML PO SYRP: 5.0000 mL | ORAL_SOLUTION | Freq: Four times a day (QID) | ORAL | Status: DC | PRN

## 2019-12-22 MED ORDER — DIAZEPAM 5 MG PO TABS: 5.0000 mg | ORAL_TABLET | Freq: Four times a day (QID) | ORAL | 0 refills | Status: DC | PRN

## 2019-12-22 MED ORDER — CYCLOBENZAPRINE HCL 5 MG PO TABS
5.0000 mg | ORAL_TABLET | Freq: Three times a day (TID) | ORAL | Status: DC | PRN
  Administered 2019-12-25 – 2020-01-06 (×7): 5 mg via ORAL
  Filled 2019-12-22 (×7): qty 1

## 2019-12-22 MED ORDER — TRAZODONE HCL 50 MG PO TABS
25.0000 mg | ORAL_TABLET | Freq: Every evening | ORAL | Status: DC | PRN
  Administered 2020-01-05: 50 mg via ORAL
  Filled 2019-12-22: qty 1

## 2019-12-22 MED ORDER — POLYETHYLENE GLYCOL 3350 17 G PO PACK
17.0000 g | PACK | Freq: Every day | ORAL | Status: DC | PRN
  Administered 2019-12-23 – 2020-01-03 (×2): 17 g via ORAL

## 2019-12-22 MED ORDER — FLEET ENEMA 7-19 GM/118ML RE ENEM: 1.0000 | ENEMA | Freq: Once | RECTAL | Status: DC | PRN

## 2019-12-22 MED ORDER — OXYCODONE HCL 5 MG PO TABS
10.0000 mg | ORAL_TABLET | ORAL | Status: DC | PRN
  Administered 2019-12-23 – 2019-12-30 (×10): 10 mg via ORAL
  Filled 2019-12-22 (×11): qty 2

## 2019-12-22 MED ORDER — BISACODYL 10 MG RE SUPP: 10.0000 mg | Freq: Every day | RECTAL | Status: DC | PRN

## 2019-12-22 MED ORDER — GABAPENTIN 100 MG PO CAPS
100.0000 mg | ORAL_CAPSULE | Freq: Three times a day (TID) | ORAL | Status: DC
  Administered 2019-12-22 – 2019-12-25 (×10): 100 mg via ORAL
  Filled 2019-12-22 (×10): qty 1

## 2019-12-22 MED ORDER — POLYETHYLENE GLYCOL 3350 17 G PO PACK
17.0000 g | PACK | Freq: Every day | ORAL | Status: DC
  Administered 2019-12-23 – 2020-01-07 (×16): 17 g via ORAL
  Filled 2019-12-22 (×16): qty 1

## 2019-12-22 MED ORDER — POLYETHYLENE GLYCOL 3350 17 G PO PACK: 17.0000 g | PACK | Freq: Every day | ORAL | 0 refills | Status: DC | PRN

## 2019-12-22 MED ORDER — PNEUMOCOCCAL VAC POLYVALENT 25 MCG/0.5ML IJ INJ
0.5000 mL | INJECTION | INTRAMUSCULAR | Status: AC
  Administered 2019-12-23: 0.5 mL via INTRAMUSCULAR
  Filled 2019-12-22: qty 0.5

## 2019-12-22 MED ORDER — MUSCLE RUB 10-15 % EX CREA
TOPICAL_CREAM | CUTANEOUS | Status: DC | PRN
  Filled 2019-12-22: qty 85

## 2019-12-22 NOTE — Progress Notes (Signed)
Occupational Therapy Treatment Patient Details Name: Colton Nichols MRN: [id number] DOB: November 23, 1965 Today's Date: 12/22/2019    History of present illness Pt is a 54 yo male s/p fall at work L3 burst fracture with cauda equina injury which is incomplete. Pt requiring L3 decompressive laminectomy with transpedicular decompression followed by L1-L5 posterior lateral arthrodesis. PMHx: current smoker.   OT comments  Pt presents seated in recliner very pleasant and motivated to work with therapy. Pt performing x3 sit<>stands during session, requiring up to modA for transitions, once upright able to maintain static standing with minA and UE support of RW, completing short distance mobility in room with minA. Pt continues to have difficulty accessing LEs (moreso RLE given continued weakness/decreased sensation). Continued education provided re: back precautions within ADL/functional context. VSS throughout. Feel pt remains an excellent candidate for CIR level therapy services. Will continue to follow while acutely admitted.   Follow Up Recommendations  CIR    Equipment Recommendations  3 in 1 bedside commode;Tub/shower seat          Precautions / Restrictions Precautions Precautions: Back;Fall Precaution Comments: rt leg buckles with sit to/from stand transition Required Braces or Orthoses: Spinal Brace Spinal Brace: Lumbar corset;Applied in sitting position Restrictions Weight Bearing Restrictions: No       Mobility Bed Mobility               General bed mobility comments: Pt up in chair  Transfers Overall transfer level: Needs assistance Equipment used: Rolling walker (2 wheeled);4-wheeled walker Transfers: Sit to/from Stand Sit to Stand: Mod assist         General transfer comment: pt with preference to push up from RW to ensure UE support/stability given RLE instability     Balance Overall balance assessment: Needs assistance Sitting-balance support: Feet  supported;No upper extremity supported Sitting balance-Leahy Scale: Good     Standing balance support: Single extremity supported;During functional activity;Bilateral upper extremity supported Standing balance-Leahy Scale: Poor Standing balance comment: UE support due to LE weakness                           ADL either performed or assessed with clinical judgement   ADL Overall ADL's : Needs assistance/impaired Eating/Feeding: Modified independent;Sitting Eating/Feeding Details (indicate cue type and reason): pt with lunch tray end of session  Grooming: Set up;Supervision/safety;Sitting Grooming Details (indicate cue type and reason): educated in compensatory technique for oral care in standing                Lower Body Dressing Details (indicate cue type and reason): educated in compensatory technique for LB ADL, pt only partly able to perform figure 4 at this time              Functional mobility during ADLs: Moderate assistance;Minimal assistance;Rolling walker       Vision       Perception     Praxis      Cognition Arousal/Alertness: Awake/alert Behavior During Therapy: WFL for tasks assessed/performed Overall Cognitive Status: Within Functional Limits for tasks assessed                                          Exercises Exercises: General Lower Extremity General Exercises - Lower Extremity Long Arc Quad: AAROM;AROM;Both;10 reps;Seated (rt AAROM, lt AROM) Hip Flexion/Marching: AROM;AAROM;Both;10 reps;Seated (rt AAROM, lt AROM)  Shoulder Instructions       General Comments VSS    Pertinent Vitals/ Pain       Pain Assessment: Faces Faces Pain Scale: Hurts little more Pain Location: Rt leg and back Pain Descriptors / Indicators: Throbbing;Grimacing;Guarding;Sore;Burning Pain Intervention(s): Limited activity within patient's tolerance;Monitored during session;Repositioned  Home Living                                           Prior Functioning/Environment              Frequency  Min 2X/week        Progress Toward Goals  OT Goals(current goals can now be found in the care plan section)  Progress towards OT goals: Progressing toward goals  Acute Rehab OT Goals Patient Stated Goal: get better OT Goal Formulation: With patient Time For Goal Achievement: 01/03/20 Potential to Achieve Goals: Good ADL Goals Pt Will Perform Upper Body Dressing: with set-up;sitting Pt Will Perform Lower Body Dressing: sitting/lateral leans;sit to/from stand;with min assist Pt Will Transfer to Toilet: with min guard assist;ambulating Pt Will Perform Toileting - Clothing Manipulation and hygiene: with min guard assist;sitting/lateral leans;sit to/from stand Additional ADL Goal #1: Pt will stand x10 mins for OOB ADL with 1 seated rest break in order to increase activity tolerance.  Plan Discharge plan remains appropriate    Co-evaluation                 AM-PAC OT "6 Clicks" Daily Activity     Outcome Measure   Help from another person eating meals?: None Help from another person taking care of personal grooming?: A Little Help from another person toileting, which includes using toliet, bedpan, or urinal?: A Lot Help from another person bathing (including washing, rinsing, drying)?: A Lot Help from another person to put on and taking off regular upper body clothing?: A Lot Help from another person to put on and taking off regular lower body clothing?: A Lot 6 Click Score: 15    End of Session Equipment Utilized During Treatment: Gait belt;Rolling walker;Back brace  OT Visit Diagnosis: Unsteadiness on feet (R26.81);Muscle weakness (generalized) (M62.81);Pain Pain - part of body:  (back, RLE)   Activity Tolerance Patient tolerated treatment well   Patient Left in chair;with call bell/phone within reach   Nurse Communication Mobility status        Time: 1137-1201 OT Time  Calculation (min): 24 min  Charges: OT General Charges $OT Visit: 1 Visit OT Treatments $Self Care/Home Management : 8-22 mins $Therapeutic Activity: 8-22 mins  Marcy Siren, OT Acute Rehabilitation Services Pager [phone] Office [phone]   Orlando Penner 12/22/2019, 5:00 PM

## 2019-12-22 NOTE — Progress Notes (Signed)
Inpatient Rehabilitation Medication Review by a Pharmacist  A complete drug regimen review was completed for this patient to identify any potential clinically significant medication issues.  Clinically significant medication issues were identified:  no  Check AMION for pharmacist assigned to patient if future medication questions/issues arise during this admission.  Pharmacist comments: Diazepam has been replaced with flexeril  Time spent performing this drug regimen review (minutes):  10 minutes   Tunis Gentle 12/22/2019 8:24 PM

## 2019-12-22 NOTE — Progress Notes (Signed)
Physical Therapy Treatment Patient Details Name: Colton Nichols MRN: [id number] DOB: 01-24-1966 Today's Date: 12/22/2019    History of Present Illness Pt is a 54 yo male s/p fall at work L3 burst fracture with cauda equina injury which is incomplete. Pt requiring L3 decompressive laminectomy with transpedicular decompression followed by L1-L5 posterior lateral arthrodesis. PMHx: current smoker.    PT Comments    Pt making steady progress. RLE still with significant weakness with hip flex 3-/5, knee extension 2+/5, and dorsiflex 3/5. Pt using UE's to try and compensate for this weakness but that then limits his functional activities. Pt very motivated to work toward returning to independence. Continue to recommend CIR for further rehab.   Follow Up Recommendations  CIR     Equipment Recommendations  Rolling walker with 5" wheels;3in1 (PT)    Recommendations for Other Services       Precautions / Restrictions Precautions Precautions: Back;Fall Precaution Comments: rt leg buckles with sit to/from stand transition Required Braces or Orthoses: Spinal Brace Spinal Brace: Lumbar corset;Applied in sitting position Restrictions Weight Bearing Restrictions: No    Mobility  Bed Mobility               General bed mobility comments: Pt up in chair  Transfers Overall transfer level: Needs assistance Equipment used: Rolling walker (2 wheeled);4-wheeled walker Transfers: Sit to/from Stand Sit to Stand: Mod assist         General transfer comment: Assist to bring hips up and to stabilize as pt transitions hands from chair to walker. Assist to control descent  Ambulation/Gait Ambulation/Gait assistance: Min assist Gait Distance (Feet): 150 Feet Assistive device: Rolling walker (2 wheeled) Gait Pattern/deviations: Step-through pattern;Decreased step length - right;Decreased step length - left;Trunk flexed;Step-to pattern Gait velocity: decr Gait velocity interpretation:  <1.31 ft/sec, indicative of household ambulator General Gait Details: Pt with heavy reliance on UE's to control instability of rt knee.    Stairs             Wheelchair Mobility    Modified Rankin (Stroke Patients Only)       Balance Overall balance assessment: Needs assistance Sitting-balance support: Feet supported;No upper extremity supported Sitting balance-Leahy Scale: Good     Standing balance support: Single extremity supported;During functional activity;Bilateral upper extremity supported Standing balance-Leahy Scale: Poor Standing balance comment: UE support due to LE weakness                            Cognition Arousal/Alertness: Awake/alert Behavior During Therapy: WFL for tasks assessed/performed Overall Cognitive Status: Within Functional Limits for tasks assessed                                        Exercises General Exercises - Lower Extremity Quad Sets: Strengthening;Both;5 reps;Seated Long Arc Quad: AAROM;AROM;Both;10 reps;Seated (rt AAROM, lt AROM) Hip Flexion/Marching: AROM;AAROM;Both;10 reps;Seated (rt AAROM, lt AROM) Toe Raises: Strengthening;Both;15 reps;Seated Mini-Sqauts: Strengthening;Both;10 reps;Standing (with min to mod assist)    General Comments        Pertinent Vitals/Pain Pain Assessment: Faces Faces Pain Scale: Hurts little more Pain Location: Rt leg and back Pain Descriptors / Indicators: Throbbing;Grimacing;Guarding;Sore;Burning Pain Intervention(s): Limited activity within patient's tolerance;Monitored during session    Home Living                      Prior  Function            PT Goals (current goals can now be found in the care plan section) Acute Rehab PT Goals Patient Stated Goal: get better Progress towards PT goals: Progressing toward goals    Frequency    Min 3X/week      PT Plan Current plan remains appropriate    Co-evaluation              AM-PAC PT  "6 Clicks" Mobility   Outcome Measure  Help needed turning from your back to your side while in a flat bed without using bedrails?: A Lot Help needed moving from lying on your back to sitting on the side of a flat bed without using bedrails?: A Lot Help needed moving to and from a bed to a chair (including a wheelchair)?: A Lot Help needed standing up from a chair using your arms (e.g., wheelchair or bedside chair)?: A Lot Help needed to walk in hospital room?: A Little Help needed climbing 3-5 steps with a railing? : Total 6 Click Score: 12    End of Session Equipment Utilized During Treatment: Gait belt Activity Tolerance: Patient tolerated treatment well Patient left: in chair;with call bell/phone within reach Nurse Communication: Mobility status PT Visit Diagnosis: Other abnormalities of gait and mobility (R26.89);Other symptoms and signs involving the nervous system (R29.898);Difficulty in walking, not elsewhere classified (R26.2)     Time: 0881-1031 PT Time Calculation (min) (ACUTE ONLY): 33 min  Charges:  $Gait Training: 8-22 mins $Neuromuscular Re-education: 8-22 mins                     [facility] PT Acute Rehabilitation Services Pager [phone] Office [phone]    Angelina Ok [facility] 12/22/2019, 10:43 AM

## 2019-12-22 NOTE — Progress Notes (Addendum)
Inpatient Rehab Admissions:  Addendum: Pt agreeable to CIR admission, with plan to return to his mother's house at discharge.  Pt very focused on returning to work and states he would prefer to discharge to friends/family home in [place] as that is closer to his work opportunities.  I reiterated that our goal, for now, was to get him strong enough to discharge, and returning to work would be later down the road.  We discussed that if he was able to confirm he could stay with someone in [place], that his dispo didn't necessarily have to be to his mother's home.  Spoke to Viona Gilmore, NP, who is in agreement with pt admit to CIR today.  Will let TOC team know.   Inpatient Rehab Consult received.  I met with patient at the bedside for rehabilitation assessment and to discuss goals and expectations of an inpatient rehab admission.  He is hopeful to be able to come to CIR to progress towards prior level of function.  Feel he would be an excellent candidate, and I did confirm with pt's mother Tessie Fass) that pt would be able to d/c to her home after CIR stay.  She does work part time, so pt would need to be able to be alone for a few hours daily; but Patsy did share that pt's father may also be able to check in if needed.  Pt not certain he wants to stay with his mother after CIR stay, and would like to make some calls to determine whether or not there are other options after CIR.  I let him know I would f/u with him shortly and see if we can confirm CIR as a possibility.    Signed: Shann Medal, PT, DPT Admissions Coordinator [phone] 12/22/19  1:50 PM

## 2019-12-22 NOTE — Progress Notes (Signed)
Patient transported to CIR [id number] on telemetry. Belongings transported with patient.  Keese RN to receive patient.

## 2019-12-22 NOTE — PMR Pre-admission (Signed)
PMR Admission Coordinator Pre-Admission Assessment  Patient: Colton Nichols is an 54 y.o., male MRN: [id number] DOB: 09/29/1965 Height: 6\' 1"  (185.4 cm) Weight: 58.2 kg              Insurance Information HMO:     PPO:      PCP:      IPA:      80/20:      OTHER:  PRIMARY: Uninsured      Policy#:       Subscriber:  CM Name:       Phone#:      Fax#:  Pre-Cert#:       Employer:  Benefits:  Phone #:      Name:  Eff. Date:      Deduct:       Out of Pocket Max:       Life Max:   CIR:       SNF:  Outpatient:      Co-Pay:  Home Health:       Co-Pay:  DME:      Co-Pay:  Providers:  SECONDARY:       Policy#:       Phone#:   :       Phone#:   [employer]" for patients in Inpatient Rehabilitation Facilities with attached "Privacy Act Statement-Health Care Records" was provided and verbally reviewed with: N/A  Emergency Contact Information Contact Information    Name Relation Home Work [place], [place] Mother   [phone]   [phone]   [phone]   [phone]   [phone]     Current Medical History  Patient Admitting Diagnosis: cauda equina  History of Present Illness: Colton Nichols is a 54 y.o. male with who fell at work on 07/31, struck the back of his head, then landed on his buttocks and developed severe low back pain radiating into BLE as well as fecal incontinence. He was found to have L3 burst fracture with 1 cm retropulsion and pronounced canal compromise, fracture of both transverse processes and fractures of bilateral L4 spinous processes. He was evaluated by Dr. 8/31 and had mild sensory deficits along L4/5 and S1 dermatomes with cauda equina symptoms. He was taken to OR emergently for L3 decompressive laminectomy with decompression of spinal canal for open reduction of L3 fracture the same day. Post op therapy evaluations showed RLE>LLE weakness affecting ADLs and mobility and recommendations for CIR.    Past Medical History  History reviewed. No pertinent past medical history.  Family History  family history is not on file. He was adopted.  Prior Rehab/Hospitalizations:  Has the patient had prior rehab or hospitalizations prior to admission? No  Has the patient had major surgery during 100 days prior to admission? Yes  Current Medications   Current Facility-Administered Medications:  .  0.9 %  sodium chloride infusion, 250 mL, Intravenous, Continuous, Jordan Likes, MD, Stopped at 12/20/19 1304 .  acetaminophen (TYLENOL) tablet 650 mg, 650 mg, Oral, Q4H PRN, 650 mg at 12/22/19 1032 **OR** acetaminophen (TYLENOL) suppository 650 mg, 650 mg, Rectal, Q4H PRN, Pool, 02/21/20, MD .  bisacodyl (DULCOLAX) suppository 10 mg, 10 mg, Rectal, Daily PRN, Sherilyn Cooter, MD .  Chlorhexidine Gluconate Cloth 2 % PADS 6 each, 6 each, Topical, Daily, Julio Sicks, MD, 6 each at 12/22/19 [id number] .  diazepam (VALIUM) tablet 5-10 mg, 5-10 mg, Oral, Q6H PRN, 6546, MD, 10 mg at 12/20/19 1150 .  HYDROcodone-acetaminophen (  NORCO) 10-325 MG per tablet 1 tablet, 1 tablet, Oral, Q4H PRN, Julio Sicks, MD, 1 tablet at 12/22/19 1346 .  HYDROmorphone (DILAUDID) injection 1 mg, 1 mg, Intravenous, Q2H PRN, Julio Sicks, MD, 1 mg at 12/21/19 2015 .  menthol-cetylpyridinium (CEPACOL) lozenge 3 mg, 1 lozenge, Oral, PRN **OR** phenol (CHLORASEPTIC) mouth spray 1 spray, 1 spray, Mouth/Throat, PRN, Julio Sicks, MD .  Muscle Rub CREA, , Topical, PRN, Julio Sicks, MD, 1 application at 12/21/19 1026 .  nicotine (NICODERM CQ - dosed in mg/24 hours) patch 21 mg, 21 mg, Transdermal, Daily, Pool, Sherilyn Cooter, MD, 21 mg at 12/22/19 1030 .  ondansetron (ZOFRAN) tablet 4 mg, 4 mg, Oral, Q6H PRN **OR** ondansetron (ZOFRAN) injection 4 mg, 4 mg, Intravenous, Q6H PRN, Pool, Sherilyn Cooter, MD .  oxyCODONE (Oxy IR/ROXICODONE) immediate release tablet 10 mg, 10 mg, Oral, Q3H PRN, Julio Sicks, MD, 10 mg at 12/22/19 1032 .  polyethylene glycol (MIRALAX /  GLYCOLAX) packet 17 g, 17 g, Oral, Daily PRN, Julio Sicks, MD, 17 g at 12/22/19 0236 .  sodium chloride flush (NS) 0.9 % injection 3 mL, 3 mL, Intravenous, Q12H, Pool, Sherilyn Cooter, MD, 10 mL at 12/22/19 1033 .  sodium chloride flush (NS) 0.9 % injection 3 mL, 3 mL, Intravenous, PRN, Pool, Henry, MD .  sodium phosphate (FLEET) 7-19 GM/118ML enema 1 enema, 1 enema, Rectal, Once PRN, Julio Sicks, MD  Patients Current Diet:  Diet Order            Diet regular Room service appropriate? Yes; Fluid consistency: Thin  Diet effective now                 Precautions / Restrictions Precautions Precautions: Back, Fall Precaution Comments: rt leg buckles with sit to/from stand transition Spinal Brace: Lumbar corset, Applied in sitting position Restrictions Weight Bearing Restrictions: No   Has the patient had 2 or more falls or a fall with injury in the past year?Yes, the fall that led to this admission.  Prior Activity Level Community (5-7x/wk): very active prior to admission, working in physical labor, doesn't drive, no DME used PTA  Prior Functional Level Prior Function Level of Independence: Independent  Self Care: Did the patient need help bathing, dressing, using the toilet or eating?  Independent  Indoor Mobility: Did the patient need assistance with walking from room to room (with or without device)? Independent  Stairs: Did the patient need assistance with internal or external stairs (with or without device)? Independent  Functional Cognition: Did the patient need help planning regular tasks such as shopping or remembering to take medications? Independent  Home Assistive Devices / Equipment Home Equipment: None  Prior Device Use: Indicate devices/aids used by the patient prior to current illness, exacerbation or injury? None of the above  Current Functional Level Cognition  Overall Cognitive Status: Within Functional Limits for tasks assessed Orientation Level: Oriented X4     Extremity Assessment (includes Sensation/Coordination)  Upper Extremity Assessment: Generalized weakness RUE Deficits / Details: fair strength 3+/5 MM grade and good grip strength LUE Deficits / Details: fair strength 3+/5 MM grade and good grip strength  Lower Extremity Assessment: RLE deficits/detail, Defer to PT evaluation RLE Deficits / Details: very weak; buckles in standing RLE Sensation:  (Pt reports intermittent numbness) LLE Deficits / Details: hip flex 3-/5, knee and ankle 3+/5    ADLs  Overall ADL's : Needs assistance/impaired Eating/Feeding: Set up, Sitting Grooming: Minimal assistance, Standing Grooming Details (indicate cue type and reason): minA for standing to  wash face/brush teeth Upper Body Bathing: Set up, Sitting Lower Body Bathing: Maximal assistance, Cueing for safety, Sitting/lateral leans, Sit to/from stand Upper Body Dressing : Moderate assistance, Sitting Upper Body Dressing Details (indicate cue type and reason): pt requiring assist to donn brace at EOB. Lower Body Dressing: Maximal assistance, Cueing for safety, Sitting/lateral leans, Sit to/from stand Lower Body Dressing Details (indicate cue type and reason): LLE stronger than RLE for figure 4 about halfway; RLE unable to perform Toilet Transfer: Moderate assistance, +2 for physical assistance, +2 for safety/equipment, Ambulation, BSC, RW Toilet Transfer Details (indicate cue type and reason): simulating to recliner; pt unable to sit without abrupt sitting d/t RLE giving out. Toileting- Clothing Manipulation and Hygiene: Moderate assistance, Sitting/lateral lean, Sit to/from stand Functional mobility during ADLs: Minimal assistance, Cueing for safety, Rolling walker, Cueing for sequencing General ADL Comments: Pt limited by decreased strength, especially in RLE, decreased ability to care for self, decreased endurance for standing tasks.    Mobility  Overal bed mobility: Needs Assistance Bed Mobility:  Rolling, Sidelying to Sit Rolling: Min assist Sidelying to sit: Mod assist General bed mobility comments: Pt up in chair    Transfers  Overall transfer level: Needs assistance Equipment used: Rolling walker (2 wheeled), 4-wheeled walker Transfers: Sit to/from Stand Sit to Stand: Mod assist General transfer comment: Assist to bring hips up and to stabilize as pt transitions hands from chair to walker. Assist to control descent    Ambulation / Gait / Stairs / Wheelchair Mobility  Ambulation/Gait Ambulation/Gait assistance: Editor, commissioning (Feet): 150 Feet Assistive device: Rolling walker (2 wheeled) Gait Pattern/deviations: Step-through pattern, Decreased step length - right, Decreased step length - left, Trunk flexed, Step-to pattern General Gait Details: Pt with heavy reliance on UE's to control instability of rt knee.  Gait velocity: decr Gait velocity interpretation: <1.31 ft/sec, indicative of household ambulator    Posture / Balance Dynamic Sitting Balance Sitting balance - Comments: UE support for pain not balance Balance Overall balance assessment: Needs assistance Sitting-balance support: Feet supported, No upper extremity supported Sitting balance-Leahy Scale: Good Sitting balance - Comments: UE support for pain not balance Standing balance support: Single extremity supported, During functional activity, Bilateral upper extremity supported Standing balance-Leahy Scale: Poor Standing balance comment: UE support due to LE weakness    Special needs/care consideration Skin surgical incision to lower back     Previous Home Environment (from acute therapy documentation) Living Arrangements: Alone (rooming house) Available Help at Discharge: Family, Available PRN/intermittently Type of Home: House Home Layout: Multi-level Alternate Level Stairs-Rails: Right Alternate Level Stairs-Number of Steps: 15 Home Access: Stairs to enter Secretary/administrator of Steps:  3 Bathroom Shower/Tub: Health visitor: Standard Additional Comments: Pt may go to her mother's home; 3 steps in front w/ rail; tub shower  Discharge Living Setting Plans for Discharge Living Setting: Lives with (comment) (mother) Type of Home at Discharge: House Discharge Home Layout: One level Discharge Home Access: Stairs to enter Entrance Stairs-Rails: Right, Left Entrance Stairs-Number of Steps: 1+2 Discharge Bathroom Shower/Tub: Tub/shower unit Discharge Bathroom Toilet: Standard Discharge Bathroom Accessibility: Yes How Accessible: Accessible via walker Does the patient have any problems obtaining your medications?: Yes (Describe) (uninsured)  Social/Family/Support Systems Anticipated Caregiver: mother Merit Maybee OR daughter in Social worker and her boyfriend (in [place]) Anticipated Caregiver's Contact Information: Lura Em [phone] Ability/Limitations of Caregiver: Lura Em works PT in the AM but is home by 1330 Caregiver Availability: Evenings only Discharge Plan Discussed with Primary Caregiver: Yes Is  Caregiver In Agreement with Plan?: Yes Does Caregiver/Family have Issues with Lodging/Transportation while Pt is in Rehab?: No   Goals Patient/Family Goal for Rehab: PT/OT supervision to mod I Expected length of stay: 14-18 days Additional Information: Pt focused on return to work, and doesn't seem to be grasping that return to work is not the priority right now.  Would prefer to stay with family/friends in [place] as that is closer to his options for work.  At time of admission, confirmed dispo is home with mom, Patsy, who can provide supervision in the afternoons/evenings but does work in the mornings. Pt/Family Agrees to Admission and willing to participate: Yes Program Orientation Provided & Reviewed with Pt/Caregiver Including Roles  & Responsibilities: Yes  Barriers to Discharge: Decreased caregiver support   Decrease burden of Care through IP rehab  admission: n/a   Possible need for SNF placement upon discharge: Not anticipated    Patient Condition: This patient's condition remains as documented in the consult dated 12/21/2019, in which the Rehabilitation Physician determined and documented that the patient's condition is appropriate for intensive rehabilitative care in an inpatient rehabilitation facility. Will admit to inpatient rehab today.  Preadmission Screen Completed By:  Stephania Fragminaitlin E Jonette Wassel, PT, DPT 12/22/2019 3:37 PM ______________________________________________________________________   Discussed status with Dr. Carlis Abbottaulkar on 12/22/19 at 3:41 PM  and received approval for admission today.  Admission Coordinator:  Stephania Fragminaitlin E Shavonta Gossen, PT, DPT time 3:41 PM Dorna Bloom/Date 12/22/19

## 2019-12-22 NOTE — Progress Notes (Signed)
Report called to CIR. Awaiting patient d/c orders.

## 2019-12-22 NOTE — H&P (Signed)
Physical Medicine and Rehabilitation Admission H&P    Chief Complaint  Patient presents with  . Cauda equina syndrome.     HPI:  Colton BangsSteven H. Nichols is a 54 year old male in relatively good health who was admitted on 12/19/19 after a fall work. He struck the back of his head then landed on his buttocks and developed severe low back pain shooting in BLE as well as fecal incontinence. He was found to have L3 burst fracture with 1 cm retropulsion with pronounced canal compromise and fracture of bilateral spinal processes. He was evaluated by Dr. Jordan LikesPool found to have mild sensory deficits in L4/5 and S1 dermatomes as well as cauda equina symptoms. He was taken to OR emergently for L3 decompressive Laminectomy with decompression of spinal canal for open reduction of L3 fracture on the same day.  Follow up labs reveal ABLA as well as thrombocytopenia. He continues to have RLE>LLE weakness with sensory deficits, neuropathy and pain affecting ADLs and mobility. CIR recommended due to functional deficits.    Review of Systems  Constitutional: Negative for chills and fever.  HENT: Negative for hearing loss and tinnitus.   Eyes: Negative for blurred vision and double vision.  Respiratory: Negative for cough and shortness of breath.   Cardiovascular: Negative for chest pain and palpitations.  Gastrointestinal: Negative for constipation, heartburn and nausea.  Genitourinary: Negative for dysuria and urgency.  Musculoskeletal: Positive for back pain and myalgias. Negative for falls and joint pain.  Skin: Negative for itching and rash.  Neurological: Positive for tingling, sensory change, speech change and focal weakness. Negative for dizziness and headaches.  Psychiatric/Behavioral: Negative for depression. The patient is not nervous/anxious and does not have insomnia.      History reviewed. No pertinent past medical history.    Past Surgical History:  Procedure Laterality Date  . LACERATION  REPAIR    . POSTERIOR LUMBAR FUSION 4 LEVEL N/A 12/19/2019   Procedure: Lumbar Three Decompressive Laminectomy with Transpedicular Decompression, Lumbar one - Lumbar five Posterior Lateral Arthrodesis utilizing segmental pedicle screw fixation and local autografting.;  Surgeon: Julio SicksPool, Henry, MD;  Location: [facility] OR;  Service: Neurosurgery;  Laterality: N/A;    Family History  Adopted: Yes    Social History: Lives alone in a boarding house. Laid off from [employer] couple of months ago and had just started work picking tobacco. He has  reports that he has been smoking cigarettes. He has been smoking about 0.50 packs per day. He has never used smokeless tobacco. He reports current alcohol use--2 beers/day. He reports that he does not use drugs.    Allergies: No Known Allergies    Medications Prior to Admission  Medication Sig Dispense Refill  . acetaminophen (TYLENOL) 325 MG tablet Take 2 tablets (650 mg total) by mouth every 4 (four) hours as needed for up to 10 days for mild pain ((score 1 to 3) or temp > 100.5). 60 tablet 0  . diazepam (VALIUM) 5 MG tablet Take 1-2 tablets (5-10 mg total) by mouth every 6 (six) hours as needed for muscle spasms. 30 tablet 0  . Menthol-Methyl Salicylate (MUSCLE RUB) 10-15 % CREA Apply 1 application topically as needed for muscle pain. 1 g 0  . [START ON 12/23/2019] nicotine (NICODERM CQ - DOSED IN MG/24 HOURS) 21 mg/24hr patch Place 1 patch (21 mg total) onto the skin daily. 28 patch 0  . ondansetron (ZOFRAN) 4 MG tablet Take 1 tablet (4 mg total) by mouth every 6 (  six) hours as needed for nausea or vomiting. 20 tablet 0  . oxyCODONE 10 MG TABS Take 1 tablet (10 mg total) by mouth every 4 (four) hours as needed for severe pain ((score 7 to 10)). 30 tablet 0  . polyethylene glycol (MIRALAX / GLYCOLAX) 17 g packet Take 17 g by mouth daily as needed for mild constipation. 14 each 0    Drug Regimen Review  Drug regimen was reviewed and remains appropriate with no  significant issues identified    Home: Home Living Family/patient expects to be discharged to:: Other (Comment) Living Arrangements: Alone (rooming house) Available Help at Discharge: Family, Available PRN/intermittently Type of Home: House Home Access: Stairs to enter Secretary/administrator of Steps: 3 Home Layout: Multi-level Alternate Level Stairs-Number of Steps: 15 Alternate Level Stairs-Rails: Right Bathroom Shower/Tub: Health visitor: Standard Home Equipment: None Additional Comments: Pt may go to her mother's home; 3 steps in front w/ rail; tub shower   Functional History: Prior Function Level of Independence: Independent  Functional Status:  Mobility: Bed Mobility Overal bed mobility: Needs Assistance Bed Mobility: Rolling, Sidelying to Sit Rolling: Min assist Sidelying to sit: Mod assist General bed mobility comments: Pt up in chair Transfers Overall transfer level: Needs assistance Equipment used: Rolling walker (2 wheeled), 4-wheeled walker Transfers: Sit to/from Stand Sit to Stand: Mod assist General transfer comment: Assist to bring hips up and to stabilize as pt transitions hands from chair to walker. Assist to control descent Ambulation/Gait Ambulation/Gait assistance: Min assist Gait Distance (Feet): 150 Feet Assistive device: Rolling walker (2 wheeled) Gait Pattern/deviations: Step-through pattern, Decreased step length - right, Decreased step length - left, Trunk flexed, Step-to pattern General Gait Details: Pt with heavy reliance on UE's to control instability of rt knee.  Gait velocity: decr Gait velocity interpretation: <1.31 ft/sec, indicative of household ambulator  ADL: ADL Overall ADL's : Needs assistance/impaired Eating/Feeding: Set up, Sitting Grooming: Minimal assistance, Standing Grooming Details (indicate cue type and reason): minA for standing to wash face/brush teeth Upper Body Bathing: Set up, Sitting Lower Body  Bathing: Maximal assistance, Cueing for safety, Sitting/lateral leans, Sit to/from stand Upper Body Dressing : Moderate assistance, Sitting Upper Body Dressing Details (indicate cue type and reason): pt requiring assist to donn brace at EOB. Lower Body Dressing: Maximal assistance, Cueing for safety, Sitting/lateral leans, Sit to/from stand Lower Body Dressing Details (indicate cue type and reason): LLE stronger than RLE for figure 4 about halfway; RLE unable to perform Toilet Transfer: Moderate assistance, +2 for physical assistance, +2 for safety/equipment, Ambulation, BSC, RW Toilet Transfer Details (indicate cue type and reason): simulating to recliner; pt unable to sit without abrupt sitting d/t RLE giving out. Toileting- Clothing Manipulation and Hygiene: Moderate assistance, Sitting/lateral lean, Sit to/from stand Functional mobility during ADLs: Minimal assistance, Cueing for safety, Rolling walker, Cueing for sequencing General ADL Comments: Pt limited by decreased strength, especially in RLE, decreased ability to care for self, decreased endurance for standing tasks.  Cognition: Cognition Overall Cognitive Status: Within Functional Limits for tasks assessed Orientation Level: Oriented X4 Cognition Arousal/Alertness: Awake/alert Behavior During Therapy: WFL for tasks assessed/performed Overall Cognitive Status: Within Functional Limits for tasks assessed  Blood pressure 119/83, pulse 99, temperature 98.1 F (36.7 C), temperature source Oral, resp. rate 16, height 6\' 1"  (1.854 m), weight 58.3 kg, SpO2 99 %.    Physical Exam  General: Sitting at the edge of chair. Appears in discomfort with very slow movements due to pain.   HEENT: Head  is normocephalic, atraumatic, PERRLA, EOMI, sclera anicteric,  Healing abrasion left forehead/left eye lid Neck: Supple without JVD or lymphadenopathy Heart: Reg rate and rhythm. No murmurs rubs or gallops Chest: CTA bilaterally without wheezes,  rales, or rhonchi; no distress Abdomen: Soft, non-tender, non-distended, bowel sounds positive. Extremities: No clubbing, cyanosis, or edema. Pulses are 2+ Skin: Contusion present on head Neuro: Speech clear. Able to follow commands without difficulty. Significant back pain with attempts at RLE flexion/extenion.   Psych: Pt's affect is appropriate. Pt is cooperative  Results for orders placed or performed during the hospital encounter of 12/19/19 (from the past 48 hour(s))  CBC     Status: Abnormal   Collection Time: 12/21/19  8:34 AM  Result Value Ref Range   WBC 12.8 (H) 4.0 - 10.5 K/uL   RBC 2.64 (L) 4.22 - 5.81 MIL/uL   Hemoglobin 9.2 (L) 13.0 - 17.0 g/dL   HCT 58.5 (L) 39 - 52 %   MCV 103.0 (H) 80.0 - 100.0 fL   MCH 34.8 (H) 26.0 - 34.0 pg   MCHC 33.8 30.0 - 36.0 g/dL   RDW 27.7 82.4 - 23.5 %   Platelets 112 (L) 150 - 400 K/uL    Comment: REPEATED TO VERIFY SPECIMEN CHECKED FOR CLOTS CONSISTENT WITH PREVIOUS RESULT    nRBC 0.0 0.0 - 0.2 %    Comment: Performed at [facility], [address], [place], [place] [address]  Basic metabolic panel     Status: Abnormal   Collection Time: 12/21/19  8:34 AM  Result Value Ref Range   Sodium 134 (L) 135 - 145 mmol/L   Potassium 3.7 3.5 - 5.1 mmol/L   Chloride 99 98 - 111 mmol/L   CO2 26 22 - 32 mmol/L   Glucose, Bld 161 (H) 70 - 99 mg/dL    Comment: Glucose reference range applies only to samples taken after fasting for at least 8 hours.   BUN 6 6 - 20 mg/dL   Creatinine, Ser 3.15 0.61 - 1.24 mg/dL   Calcium 8.7 (L) 8.9 - 10.3 mg/dL   GFR calc non Af Amer >60 >60 mL/min   GFR calc Af Amer >60 >60 mL/min   Anion gap 9 5 - 15    Comment: Performed at [facility], [address] [place], [place] [address]   No results found.     Medical Problem List and Plan: 1.  Impaired mobility and ADLs secondary to cauda equina syndrome  -patient may shower but incision must be covered.   -ELOS/Goals: 7-8 days modI 2.   Antithrombotics: -DVT/anticoagulation:  Mechanical: Sequential compression devices, below knee Bilateral lower extremities Order dopplers for am.   -antiplatelet therapy: N/A 3. Pain Management:  Oxycodone and flexeril prn. Add Oxycontin 10 mg bid for consistent pain relief--still looks in quite a bit of pain. Will add gabapentin tid for neuropathy also.   4. Mood: LCSW to follow for evaluation and support.   -antipsychotic agents: N/A 5. Neuropsych: This patient is capable of making decisions on his own behalf. 6. Skin/Wound Care: Monitor incision for healing.   7. Fluids/Electrolytes/Nutrition: Monitor I/O. Add protein supplement for low calorie malnutrition. K+ 3.7 on 8/2.  Check lytes in am.   8. Leucocytosis: Likely reactive--17.1-->12.8. Monitor for signs of infection. Check WBC in am 9. ABLA: Hgb 9.2 on 8/2. Recheck in am.  10. Thrombocytopenia: Progressive--202-->112. Will recheck in am.  11. Hyponatremia/Hyperglycemia: Na 134 on 8/2. Recheck BMET in am.  12. Constipation: Schedule Miralax daily in am --effective.   13. HTN: Monitor BP TID -- elevated likely due to poorly controlled pain.   Delle Reining, PA-C  I have personally performed a face to face diagnostic evaluation, including, but not limited to relevant history and physical exam findings, of this patient and developed relevant assessment and plan.  Additionally, I have reviewed and concur with the physician assistant's documentation above.  The patient's status has not changed. The original post admission physician evaluation remains appropriate, and any changes from the pre-admission screening or documentation from the acute chart are noted above.   Sula Soda, MD

## 2019-12-22 NOTE — Discharge Instructions (Signed)
Alert and oriented x 4 PERRLA CN II-XII grossly intact MAE, Strength and sensation improving Incision is covered with Honeycomb dressing and Steri Strips; Dressing is clean, dry, and intact

## 2019-12-22 NOTE — TOC CAGE-AID Note (Signed)
Transition of Care [facility]) - CAGE-AID Screening   Patient Details  Name: Colton Nichols MRN: [id number] Date of Birth: 31-Jan-1966  Transition [facility]) CM/SW Contact:    Emeterio Reeve, [place] Phone Number: 12/22/2019, 3:28 PM   Clinical Narrative:  CSW met with pt at bedside. CSW introduced self and explained her role at the hospital.  Pt reports that he has 1-2 beers a night after work. Pt reports its just a way to relax after a long day. Pt reports he does not have a problem with alcohol use. Pt denies substance use. Pt did not need resources at this time.   CAGE-AID Screening:    Have You Ever Felt You Ought to Cut Down on Your Drinking or Drug Use?: No Have People Annoyed You By Critizing Your Drinking Or Drug Use?: No Have You Felt Bad Or Guilty About Your Drinking Or Drug Use?: No Have You Ever Had a Drink or Used Drugs First Thing In The Morning to Steady Your Nerves or to Get Rid of a Hangover?: No CAGE-AID Score: 0  Substance Abuse Education Offered: Yes  Substance abuse interventions: Patient Counseling  Emeterio Reeve, Latanya Presser, [place] Social Worker [phone]

## 2019-12-22 NOTE — Progress Notes (Signed)
Hemovac removed per Meghan NP's order. Dressing on surgical site reinforced.  Patient resting in bed now.   RN will continue to monitor.

## 2019-12-22 NOTE — Progress Notes (Signed)
PMR Admission Coordinator Pre-Admission Assessment   Patient: Colton Nichols is an 54 y.o., male MRN: [id number] DOB: [birth year] Height: 6\' 1"  (185.4 cm) Weight: 58.2 kg                                                                                                                                                  Insurance Information HMO:     PPO:      PCP:      IPA:      80/20:      OTHER:  PRIMARY: Uninsured      Policy#:       Subscriber:  CM Name:       Phone#:      Fax#:  Pre-Cert#:       Employer:  Benefits:  Phone #:      Name:  Eff. Date:      Deduct:       Out of Pocket Max:       Life Max:   CIR:       SNF:  Outpatient:      Co-Pay:  Home Health:       Co-Pay:  DME:      Co-Pay:  Providers:  SECONDARY:       Policy#:       Phone#:    :       Phone#:    [employer] [facility]" for patients in Inpatient Rehabilitation Facilities with attached "Privacy Act Statement-Health Care Records" was provided and verbally reviewed with: N/A   Emergency Contact Information         Contact Information     Name Relation Home Work [place], [place] Mother     [phone]    [phone]     [phone]    [phone]     [phone]       Current Medical History  Patient Admitting Diagnosis: cauda equina   History of Present Illness: Colton Nichols is a 54 y.o. male with who fell at work on 07/31, struck the back of his head, then landed on his buttocks and developed severe low back pain radiating into BLE as well as fecal incontinence. He was found to have L3 burst fracture with 1 cm retropulsion and pronounced canal compromise, fracture of both transverse processes and fractures of bilateral L4 spinous processes. He was evaluated by Dr. 8/31 and had mild sensory deficits along L4/5 and S1 dermatomes with cauda equina symptoms. He was taken to OR emergently for L3 decompressive laminectomy with decompression of spinal  canal for open reduction of L3 fracture the same day. Post op therapy evaluations showed RLE>LLE weakness affecting ADLs and mobility and recommendations for CIR.    Past Medical History  History reviewed. No pertinent past medical history.   Family History  family history is not on file. He was adopted.   Prior Rehab/Hospitalizations:  Has the patient had prior rehab or hospitalizations prior to admission? No   Has the patient had major surgery during 100 days prior to admission? Yes   Current Medications    Current Facility-Administered Medications:  .  0.9 %  sodium chloride infusion, 250 mL, Intravenous, Continuous, Julio Sicks, MD, Stopped at 12/20/19 1304 .  acetaminophen (TYLENOL) tablet 650 mg, 650 mg, Oral, Q4H PRN, 650 mg at 12/22/19 1032 **OR** acetaminophen (TYLENOL) suppository 650 mg, 650 mg, Rectal, Q4H PRN, Pool, Sherilyn Cooter, MD .  bisacodyl (DULCOLAX) suppository 10 mg, 10 mg, Rectal, Daily PRN, Julio Sicks, MD .  Chlorhexidine Gluconate Cloth 2 % PADS 6 each, 6 each, Topical, Daily, Julio Sicks, MD, 6 each at 12/22/19 [phone] .  diazepam (VALIUM) tablet 5-10 mg, 5-10 mg, Oral, Q6H PRN, Julio Sicks, MD, 10 mg at 12/20/19 1150 .  HYDROcodone-acetaminophen (NORCO) 10-325 MG per tablet 1 tablet, 1 tablet, Oral, Q4H PRN, Julio Sicks, MD, 1 tablet at 12/22/19 1346 .  HYDROmorphone (DILAUDID) injection 1 mg, 1 mg, Intravenous, Q2H PRN, Julio Sicks, MD, 1 mg at 12/21/19 2015 .  menthol-cetylpyridinium (CEPACOL) lozenge 3 mg, 1 lozenge, Oral, PRN **OR** phenol (CHLORASEPTIC) mouth spray 1 spray, 1 spray, Mouth/Throat, PRN, Julio Sicks, MD .  Muscle Rub CREA, , Topical, PRN, Julio Sicks, MD, 1 application at 12/21/19 1026 .  nicotine (NICODERM CQ - dosed in mg/24 hours) patch 21 mg, 21 mg, Transdermal, Daily, Pool, Sherilyn Cooter, MD, 21 mg at 12/22/19 1030 .  ondansetron (ZOFRAN) tablet 4 mg, 4 mg, Oral, Q6H PRN **OR** ondansetron (ZOFRAN) injection 4 mg, 4 mg, Intravenous, Q6H PRN, Pool, Sherilyn Cooter,  MD .  oxyCODONE (Oxy IR/ROXICODONE) immediate release tablet 10 mg, 10 mg, Oral, Q3H PRN, Julio Sicks, MD, 10 mg at 12/22/19 1032 .  polyethylene glycol (MIRALAX / GLYCOLAX) packet 17 g, 17 g, Oral, Daily PRN, Julio Sicks, MD, 17 g at 12/22/19 0236 .  sodium chloride flush (NS) 0.9 % injection 3 mL, 3 mL, Intravenous, Q12H, Pool, Sherilyn Cooter, MD, 10 mL at 12/22/19 1033 .  sodium chloride flush (NS) 0.9 % injection 3 mL, 3 mL, Intravenous, PRN, Pool, Henry, MD .  sodium phosphate (FLEET) 7-19 GM/118ML enema 1 enema, 1 enema, Rectal, Once PRN, Julio Sicks, MD   Patients Current Diet:     Diet Order                      Diet regular Room service appropriate? Yes; Fluid consistency: Thin  Diet effective now                      Precautions / Restrictions Precautions Precautions: Back, Fall Precaution Comments: rt leg buckles with sit to/from stand transition Spinal Brace: Lumbar corset, Applied in sitting position Restrictions Weight Bearing Restrictions: No    Has the patient had 2 or more falls or a fall with injury in the past year?Yes, the fall that led to this admission.   Prior Activity Level Community (5-7x/wk): very active prior to admission, working in physical labor, doesn't drive, no DME used PTA   Prior Functional Level Prior Function Level of Independence: Independent   Self Care: Did the patient need help bathing, dressing, using the toilet or eating?  Independent   Indoor Mobility: Did the patient need assistance with walking from room to room (with or without device)? Independent   Stairs: Did the patient  need assistance with internal or external stairs (with or without device)? Independent   Functional Cognition: Did the patient need help planning regular tasks such as shopping or remembering to take medications? Independent   Home Assistive Devices / Equipment Home Equipment: None   Prior Device Use: Indicate devices/aids used by the patient prior to current  illness, exacerbation or injury? None of the above   Current Functional Level Cognition   Overall Cognitive Status: Within Functional Limits for tasks assessed Orientation Level: Oriented X4    Extremity Assessment (includes Sensation/Coordination)   Upper Extremity Assessment: Generalized weakness RUE Deficits / Details: fair strength 3+/5 MM grade and good grip strength LUE Deficits / Details: fair strength 3+/5 MM grade and good grip strength  Lower Extremity Assessment: RLE deficits/detail, Defer to PT evaluation RLE Deficits / Details: very weak; buckles in standing RLE Sensation:  (Pt reports intermittent numbness) LLE Deficits / Details: hip flex 3-/5, knee and ankle 3+/5     ADLs   Overall ADL's : Needs assistance/impaired Eating/Feeding: Set up, Sitting Grooming: Minimal assistance, Standing Grooming Details (indicate cue type and reason): minA for standing to wash face/brush teeth Upper Body Bathing: Set up, Sitting Lower Body Bathing: Maximal assistance, Cueing for safety, Sitting/lateral leans, Sit to/from stand Upper Body Dressing : Moderate assistance, Sitting Upper Body Dressing Details (indicate cue type and reason): pt requiring assist to donn brace at EOB. Lower Body Dressing: Maximal assistance, Cueing for safety, Sitting/lateral leans, Sit to/from stand Lower Body Dressing Details (indicate cue type and reason): LLE stronger than RLE for figure 4 about halfway; RLE unable to perform Toilet Transfer: Moderate assistance, +2 for physical assistance, +2 for safety/equipment, Ambulation, BSC, RW Toilet Transfer Details (indicate cue type and reason): simulating to recliner; pt unable to sit without abrupt sitting d/t RLE giving out. Toileting- Clothing Manipulation and Hygiene: Moderate assistance, Sitting/lateral lean, Sit to/from stand Functional mobility during ADLs: Minimal assistance, Cueing for safety, Rolling walker, Cueing for sequencing General ADL Comments:  Pt limited by decreased strength, especially in RLE, decreased ability to care for self, decreased endurance for standing tasks.     Mobility   Overal bed mobility: Needs Assistance Bed Mobility: Rolling, Sidelying to Sit Rolling: Min assist Sidelying to sit: Mod assist General bed mobility comments: Pt up in chair     Transfers   Overall transfer level: Needs assistance Equipment used: Rolling walker (2 wheeled), 4-wheeled walker Transfers: Sit to/from Stand Sit to Stand: Mod assist General transfer comment: Assist to bring hips up and to stabilize as pt transitions hands from chair to walker. Assist to control descent     Ambulation / Gait / Stairs / Wheelchair Mobility   Ambulation/Gait Ambulation/Gait assistance: Editor, commissioningMin assist Gait Distance (Feet): 150 Feet Assistive device: Rolling walker (2 wheeled) Gait Pattern/deviations: Step-through pattern, Decreased step length - right, Decreased step length - left, Trunk flexed, Step-to pattern General Gait Details: Pt with heavy reliance on UE's to control instability of rt knee.  Gait velocity: decr Gait velocity interpretation: <1.31 ft/sec, indicative of household ambulator     Posture / Balance Dynamic Sitting Balance Sitting balance - Comments: UE support for pain not balance Balance Overall balance assessment: Needs assistance Sitting-balance support: Feet supported, No upper extremity supported Sitting balance-Leahy Scale: Good Sitting balance - Comments: UE support for pain not balance Standing balance support: Single extremity supported, During functional activity, Bilateral upper extremity supported Standing balance-Leahy Scale: Poor Standing balance comment: UE support due to LE weakness  Special needs/care consideration Skin surgical incision to lower back        Previous Home Environment (from acute therapy documentation) Living Arrangements: Alone (rooming house) Available Help at Discharge: Family, Available  PRN/intermittently Type of Home: House Home Layout: Multi-level Alternate Level Stairs-Rails: Right Alternate Level Stairs-Number of Steps: 15 Home Access: Stairs to enter Secretary/administrator of Steps: 3 Bathroom Shower/Tub: Health visitor: Standard Additional Comments: Pt may go to her mother's home; 3 steps in front w/ rail; tub shower   Discharge Living Setting Plans for Discharge Living Setting: Lives with (comment) (mother) Type of Home at Discharge: House Discharge Home Layout: One level Discharge Home Access: Stairs to enter Entrance Stairs-Rails: Right, Left Entrance Stairs-Number of Steps: 1+2 Discharge Bathroom Shower/Tub: Tub/shower unit Discharge Bathroom Toilet: Standard Discharge Bathroom Accessibility: Yes How Accessible: Accessible via walker Does the patient have any problems obtaining your medications?: Yes (Describe) (uninsured)   Social/Family/Support Systems Anticipated Caregiver: mother Isaack Preble OR daughter in Social worker and her boyfriend (in [place]) Anticipated Caregiver's Contact Information: Lura Em [phone] Ability/Limitations of Caregiver: Lura Em works PT in the AM but is home by 1330 Caregiver Availability: Evenings only Discharge Plan Discussed with Primary Caregiver: Yes Is Caregiver In Agreement with Plan?: Yes Does Caregiver/Family have Issues with Lodging/Transportation while Pt is in Rehab?: No     Goals Patient/Family Goal for Rehab: PT/OT supervision to mod I Expected length of stay: 14-18 days Additional Information: Pt focused on return to work, and doesn't seem to be grasping that return to work is not the priority right now.  Would prefer to stay with family/friends in [place] as that is closer to his options for work.  At time of admission, confirmed dispo is home with mom, Patsy, who can provide supervision in the afternoons/evenings but does work in the mornings. Pt/Family Agrees to Admission and willing to  participate: Yes Program Orientation Provided & Reviewed with Pt/Caregiver Including Roles  & Responsibilities: Yes  Barriers to Discharge: Decreased caregiver support     Decrease burden of Care through IP rehab admission: n/a     Possible need for SNF placement upon discharge: Not anticipated      Patient Condition: This patient's condition remains as documented in the consult dated 12/21/2019, in which the Rehabilitation Physician determined and documented that the patient's condition is appropriate for intensive rehabilitative care in an inpatient rehabilitation facility. Will admit to inpatient rehab today.   Preadmission Screen Completed By:  Stephania Fragmin, PT, DPT 12/22/2019 3:37 PM ______________________________________________________________________   Discussed status with Dr. Carlis Abbott on 12/22/19 at 3:41 PM  and received approval for admission today.   Admission Coordinator:  Stephania Fragmin, PT, DPT time 3:41 PM Dorna Bloom 12/22/19              Cosigned by: Horton Chin, MD at 12/22/2019  3:52 PM

## 2019-12-22 NOTE — Progress Notes (Addendum)
   Providing Compassionate, Quality Care - Together   Subjective: Patient reports he feels ready for inpatient rehab. He feels his pain is well-controlled. He is ambulating with a walker with therapies at the time of this assessment.   Objective: Vital signs in last 24 hours: Temp:  [98.5 F (36.9 C)-98.8 F (37.1 C)] 98.6 F (37 C) (08/03 0724) Pulse Rate:  [94-112] 107 (08/03 0845) Resp:  [8-24] 18 (08/03 0845) BP: (114-159)/(76-97) 114/85 (08/03 0845) SpO2:  [97 %-100 %] 100 % (08/03 0845)  Intake/Output from previous day: 08/02 0701 - 08/03 0700 In: 243 [P.O.:240; I.V.:3] Out: 480 [Urine:275; Drains:205] Intake/Output this shift: Total I/O In: 360 [P.O.:360] Out: 175 [Urine:175]  Alert and oriented x 4 PERRLA CN II-XII grossly intact MAE, Strength and sensation improving Incision is covered with Honeycomb dressing and Steri Strips; Dressing is dry and intact with a small amount of dried sanguinous drainage on his dressing   Lab Results: Recent Labs    12/20/19 0809 12/21/19 0834  WBC 13.9* 12.8*  HGB 9.6* 9.2*  HCT 28.7* 27.2*  PLT 117* 112*   BMET Recent Labs    12/20/19 0809 12/21/19 0834  NA 136 134*  K 4.2 3.7  CL 102 99  CO2 26 26  GLUCOSE 142* 161*  BUN 8 6  CREATININE 1.08 1.03  CALCIUM 8.4* 8.7*    Studies/Results: No results found.  Assessment/Plan: Patient is three days status post L1-L5 posterior lateral arthrodesis for stabilization of his L3 burst fracture. His sensory and motor exam is improving. He is awaiting CIR placement.   LOS: 3 days    -Discontinue Hemovac -Continue to mobilize  Val Eagle, DNP, AGNP-C Nurse Practitioner  [facility] [address], [place], [place] [address] P: [phone]    F: [phone]  12/22/2019, 9:54 AM

## 2019-12-22 NOTE — Discharge Summary (Addendum)
Physician Discharge Summary     Providing Compassionate, Quality Care - Together   Patient ID: Colton Nichols MRN: [id number] DOB/AGE: Feb 05, 1966 54 y.o.  Admit date: 12/19/2019 Discharge date: 12/22/2019  Admission Diagnoses: T3 Burst Fracture  Discharge Diagnoses:  Active Problems:   Burst fracture of lumbar vertebra [facility])   Surgery follow-up   Burst fracture of lumbar vertebra, closed, initial encounter [facility])   Discharged Condition: good  Hospital Course: Patient underwent an L1 to L5 posterior lateral arthrodesis by Dr. Jordan Likes on 12/19/2019 to stabilize an L3 burst fracture. He was [facility] to [name] after recovering from anesthesia in the PACU. His postoperative course has been uncomplicated. He has worked with both physical and occupational therapies who are recommending . He is ambulating independently and without difficulty. He is tolerating a normal diet. He is not having any bowel or bladder dysfunction. His pain is well-controlled with oral pain medication. He is ready for discharge to CIR.  Consults: rehabilitation medicine  Significant Diagnostic Studies: radiology: DG Lumbar Spine 2-3 Views  Result Date: 12/19/2019 CLINICAL DATA:  L1 through L5 fusion. EXAM: DG C-ARM 1-60 MIN; LUMBAR SPINE - 2-3 VIEW FLUOROSCOPY TIME:  Fluoroscopy Time:  53 seconds Radiation Exposure Index (if provided by the fluoroscopic device): 15.19 mGy Number of Acquired Spot Images: 5 COMPARISON:  CT earlier this day. FINDINGS: Five fluoroscopic spot views of the lumbar spine obtained in the operating room in frontal and lateral projections. Intrapedicular screws at L1, L2, L4, and L5. Fluoroscopy time as described above. IMPRESSION: Intraoperative fluoroscopy during lumbar surgery. Electronically Signed   By: Narda Rutherford M.D.   On: 12/19/2019 15:58   CT Lumbar Spine Wo Contrast  Result Date: 12/19/2019 CLINICAL DATA:  [name] today with severe back pain and pain radiating down both legs. EXAM: CT  LUMBAR SPINE WITHOUT CONTRAST TECHNIQUE: Multidetector CT imaging of the lumbar spine was performed without intravenous contrast administration. Multiplanar CT image reconstructions were also generated. COMPARISON:  Radiography 10/07/2018 FINDINGS: Segmentation: 5 lumbar type vertebral bodies. Alignment: Straightening of the normal lumbar lordosis. Vertebrae: Burst compression fracture of the L3 vertebral body. Retropulsion of the posterior aspect of the vertebral body by distance of 10 mm, markedly encroaching upon the spinal canal, leaving a space of only 3 mm or less in the posterior spinal canal. Pedicles are intact. Both transverse processes are fractured at this level. Spinous process intact. At L4, there is a nondisplaced fracture of both transverse processes. Paraspinal and other soft tissues: Aortic atherosclerosis. Disc levels: L4-5 disc bulge with mild stenosis of both lateral recesses. L5-S1 disc bulge with subarticular lateral recess narrowing left more than right IMPRESSION: Burst compression fracture of the L3 vertebral body with 1 cm retropulsion and pronounced compromise of the spinal canal, residual space only 3 mm or less. Fracture of both spinous processes at this level as well. Fracture of both spinous processes at L4, nondisplaced. Mild degenerative changes at L4-5 and L5-S1. Aortic Atherosclerosis (ICD10-I70.0). Electronically Signed   By: Paulina Fusi M.D.   On: 12/19/2019 08:36   CT PELVIS WO CONTRAST  Result Date: 12/19/2019 CLINICAL DATA:  54 year old who tripped and fell this morning, landing on his buttocks, presenting with severe low back pain radiating into both LOWER extremities. Patient had an episode of stool incontinence. Initial encounter. EXAM: CT PELVIS WITHOUT CONTRAST TECHNIQUE: Multidetector CT imaging of the pelvis was performed following the standard protocol without intravenous contrast. COMPARISON:  None. FINDINGS: Urinary Tract: Normal appearing urinary bladder. No  evidence  of ureteral dilation. Bowel: Liquid stool throughout the visualized colon which is normal in appearance. Visualized small bowel unremarkable. Vascular/Lymphatic: Severe aortoiliofemoral atherosclerosis without evidence of aneurysm. Reproductive: Mild to moderate median lobe prostate gland enlargement. Prosthetic calcifications. Normal seminal vesicles. Other:  None. Musculoskeletal: No evidence of acute fracture. Broad-base central disc protrusion at L4 with borderline to mild multifactorial spinal stenosis at this level. Broad-base central disc protrusion or extrusion at L5 without evidence of spinal stenosis. IMPRESSION: 1. No evidence of acute fracture or dislocation. 2. Broad-base central disc protrusion at L4 with borderline to mild multifactorial spinal stenosis at this level. 3. Broad-base central disc protrusion or extrusion at L5 without evidence of spinal stenosis. 4. Mild to moderate median lobe prostate gland enlargement. Aortic Atherosclerosis (ICD10-I70.0). Electronically Signed   By: Hulan Saas M.D.   On: 12/19/2019 09:25   DG C-Arm 1-60 Min  Result Date: 12/19/2019 CLINICAL DATA:  L1 through L5 fusion. EXAM: DG C-ARM 1-60 MIN; LUMBAR SPINE - 2-3 VIEW FLUOROSCOPY TIME:  Fluoroscopy Time:  53 seconds Radiation Exposure Index (if provided by the fluoroscopic device): 15.19 mGy Number of Acquired Spot Images: 5 COMPARISON:  CT earlier this day. FINDINGS: Five fluoroscopic spot views of the lumbar spine obtained in the operating room in frontal and lateral projections. Intrapedicular screws at L1, L2, L4, and L5. Fluoroscopy time as described above. IMPRESSION: Intraoperative fluoroscopy during lumbar surgery. Electronically Signed   By: Narda Rutherford M.D.   On: 12/19/2019 15:58   DG Femur Min 2 Views Right  Result Date: 12/19/2019 CLINICAL DATA:  Fall. Low back and pain shooting down the right leg. EXAM: RIGHT FEMUR 2 VIEWS COMPARISON:  None. FINDINGS: No fracture. There is a  sclerotic medullary bone lesion in the distal femoral diaphysis consistent with a benign enchondroma. No other bone lesions. Hip and knee joints are normally spaced and aligned. There are scattered arterial vascular calcifications along the femoral artery. IMPRESSION: 1. No fracture or acute finding.  No joint abnormality. Electronically Signed   By: Amie Portland M.D.   On: 12/19/2019 09:21     Treatments:  1. surgery: L3 decompressive laminectomy with transpedicular decompression of the spinal canal for open reduction of his L3 fracture  2. Repair of traumatic dural laceration  3. L1-L5 posterior lateral arthrodesis utilizing morselized autograft, morselized allograft, and segmental pedicle screw fixation  Discharge Exam: Blood pressure (!) 141/100, pulse (!) 107, temperature 98.8 F (37.1 C), resp. rate 17, height 6\' 1"  (1.854 m), weight 58.2 kg, SpO2 100 %.   Alert and oriented x 4 PERRLA CN II-XII grossly intact MAE, Strength and sensation improving Incision is covered with Honeycomb dressing and Steri Strips; Dressing is clean, dry, and intact   Disposition: Discharge disposition: 70-Another Health Care Institution Not Defined        Allergies as of 12/22/2019   No Known Allergies     Medication List    STOP taking these medications   ibuprofen 800 MG tablet Commonly known as: ADVIL   predniSONE 10 MG tablet Commonly known as: DELTASONE     TAKE these medications   acetaminophen 325 MG tablet Commonly known as: TYLENOL Take 2 tablets (650 mg total) by mouth every 4 (four) hours as needed for up to 10 days for mild pain ((score 1 to 3) or temp > 100.5).   diazepam 5 MG tablet Commonly known as: VALIUM Take 1-2 tablets (5-10 mg total) by mouth every 6 (six) hours as needed for muscle spasms.  Muscle Rub 10-15 % Crea Apply 1 application topically as needed for muscle pain.   nicotine 21 mg/24hr patch Commonly known as: NICODERM CQ - dosed in mg/24  hours Place 1 patch (21 mg total) onto the skin daily. Start taking on: December 23, 2019   ondansetron 4 MG tablet Commonly known as: ZOFRAN Take 1 tablet (4 mg total) by mouth every 6 (six) hours as needed for nausea or vomiting.   Oxycodone HCl 10 MG Tabs Take 1 tablet (10 mg total) by mouth every 4 (four) hours as needed for severe pain ((score 7 to 10)).   polyethylene glycol 17 g packet Commonly known as: MIRALAX / GLYCOLAX Take 17 g by mouth daily as needed for mild constipation.            Durable Medical Equipment  (From admission, onward)         Start     Ordered   12/19/19 1959  DME Walker rolling  Once       Question:  Patient needs a walker to treat with the following condition  Answer:  Burst fracture of lumbar vertebra, closed, initial encounter (HCC)   12/19/19 1958   12/19/19 1959  DME 3 n 1  Once        12/19/19 1958          Follow-up Information    [facility] Follow up on 01/15/2020.   Why: 9:30 am, Dr. Jonah Blue Contact information: [address] [place] [address] [phone]       Julio Sicks, MD. Schedule an appointment as soon as possible for a visit in 2 week(s).   Specialty: Neurosurgery Contact information: [address] [place] [address] [phone]               Signed: Floreen Comber 12/22/2019, 5:24 PM

## 2019-12-22 NOTE — Progress Notes (Signed)
Pt arrived with nurse Duffy Rhody, Ria Comment), who did skin assessment with this nurse. Patient was awake alert and oriented x 4, calm, cooperative, and talkative in bed. This nurse took pt's vitals and went over how rehab works and the Banker. Pts bed is at lowest, non skid socks applied, items in reach, and call light placed in reach. Pt has no complaints at this time. This nurse will pass further admission questions to next shift's RN after initial assessment is charted.

## 2019-12-22 NOTE — Progress Notes (Signed)
Physical Medicine and Rehabilitation Consult     Reason for Consult: Cauda equina syndrome.  Referring Physician: Dr. Jordan Likes     HPI: Colton Nichols is a 54 y.o. male with who fell at work on 07/31, struck the back of his head then landed on his buttocks and developed severe low back pain radiating into BLE as well as fecal incontinence. He was found to have L3 burst fracture with 1 cm retropulsion with pronounced canal compromise, fracture of both spinal processes and fractures of bilateral L4 spinous processes. He was evaluated by Dr. Jordan Likes and had mild sensory deficits  L4/5 and S1 dermatomes with cauda equina symptoms. He was taken to OR emergently for L3 decompressive laminectomy with decompression of spinal canal for open reduction of L3 fracture the same day. Post op therapy evaluations showed RLE>LLE weakness affecting ADLs and mobility. CIR recommended due to functional decline.      Review of Systems  Constitutional: Negative for chills and fever.  HENT: Negative for hearing loss and tinnitus.   Eyes: Negative for blurred vision and double vision.  Respiratory: Negative for cough and shortness of breath.   Cardiovascular: Negative for chest pain and palpitations.  Gastrointestinal: Positive for constipation (has not had BM since admission). Negative for heartburn and nausea.  Genitourinary: Negative for dysuria, frequency and urgency.  Musculoskeletal: Positive for back pain, joint pain and myalgias.       Muscle spasms as well as right thigh pain and back spasms with movement  Skin: Negative for itching and rash.  Neurological: Positive for sensory change (RLE>LLE from knees down) and weakness. Negative for dizziness and headaches.  Psychiatric/Behavioral: The patient is not nervous/anxious and does not have insomnia.       History reviewed. No pertinent past medical history.           Past Surgical History:  Procedure Laterality Date  . LACERATION REPAIR           Family History  Adopted: Yes      Social History:  Single. 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 Was laid off from [employer] 2 months ago.  He reports that he has been smoking cigarettes. He has been smoking about 0.50 packs per day. He has never used smokeless tobacco. He reports current alcohol use--2 beers/day. He reports that he does not use drugs.      Allergies: No Known Allergies      Medications Prior to Admission  Medication Sig Dispense Refill  . ibuprofen (ADVIL) 800 MG tablet Take 1 tablet (800 mg total) by mouth 3 (three) times daily. (Patient not taking: Reported on 12/19/2019) 21 tablet 0  . predniSONE (DELTASONE) 10 MG tablet 6,5,4,3,2,1 taper (Patient not taking: Reported on 12/19/2019) 21 tablet 0      Home: Home Living Family/patient expects to be discharged to:: Other (Comment) Living Arrangements: Alone (rooming house) Available Help at Discharge: Family, Available PRN/intermittently Type of Home: House Home Access: Stairs to enter Secretary/administrator of Steps: 3 Home Layout: Multi-level Alternate Level Stairs-Number of Steps: 15 Alternate Level Stairs-Rails: Right Bathroom Shower/Tub: Health visitor: Standard Home Equipment: None Additional Comments: Pt may go to her mother's home; 3 steps in front w/ rail; tub shower  Functional History: Prior Function Level of Independence: Independent Functional Status:  Mobility: Bed Mobility Overal bed mobility: Needs Assistance Bed Mobility: Rolling, Sidelying to  Sit Rolling: Min assist Sidelying to sit: Mod assist General bed mobility comments: Assist for log roll technique; pt in pain halfway and modA +2 for trunk elevation as BLEs lowered down Transfers Overall transfer level: Needs assistance Equipment used: Rolling walker (2 wheeled), 4-wheeled walker Transfers: Sit to/from Stand Sit to Stand: Mod assist, +2 safety/equipment General  transfer comment: Assist for hand placement and power up without posterior lean  Ambulation/Gait Ambulation/Gait assistance: Min assist Gait Distance (Feet): 150 Feet Assistive device: Rolling walker (2 wheeled) Gait Pattern/deviations: Step-through pattern, Decreased step length - right, Decreased step length - left, Trunk flexed General Gait Details: Pt with heavy reliance of UE's on walker to support weak rt leg. Rt knee with hyperextension.  Gait velocity: decr Gait velocity interpretation: <1.31 ft/sec, indicative of household ambulator   ADL: ADL Overall ADL's : Needs assistance/impaired Eating/Feeding: Set up, Sitting Grooming: Minimal assistance, Standing Grooming Details (indicate cue type and reason): minA for standing to wash face/brush teeth Upper Body Bathing: Set up, Sitting Lower Body Bathing: Maximal assistance, Cueing for safety, Sitting/lateral leans, Sit to/from stand Upper Body Dressing : Moderate assistance, Sitting Upper Body Dressing Details (indicate cue type and reason): pt requiring assist to donn brace at EOB. Lower Body Dressing: Maximal assistance, Cueing for safety, Sitting/lateral leans, Sit to/from stand Lower Body Dressing Details (indicate cue type and reason): LLE stronger than RLE for figure 4 about halfway; RLE unable to perform Toilet Transfer: Moderate assistance, +2 for physical assistance, +2 for safety/equipment, Ambulation, BSC, RW Toilet Transfer Details (indicate cue type and reason): simulating to recliner; pt unable to sit without abrupt sitting d/t RLE giving out. Toileting- Clothing Manipulation and Hygiene: Moderate assistance, Sitting/lateral lean, Sit to/from stand Functional mobility during ADLs: Minimal assistance, Cueing for safety, Rolling walker, Cueing for sequencing General ADL Comments: Pt limited by decreased strength, especially in RLE, decreased ability to care for self, decreased endurance for standing tasks.    Cognition: Cognition Overall Cognitive Status: Within Functional Limits for tasks assessed Orientation Level: Oriented X4 Cognition Arousal/Alertness: Awake/alert Behavior During Therapy: WFL for tasks assessed/performed Overall Cognitive Status: Within Functional Limits for tasks assessed   Blood pressure 113/82, pulse 93, temperature 98.7 F (37.1 C), resp. rate 15, height 6\' 1"  (1.854 m), weight 58.2 kg, SpO2 100 %. Physical Exam   General: Alert and oriented x 3, No apparent distress HEENT: Head is normocephalic, atraumatic, PERRLA, EOMI, sclera anicteric, oral mucosa pink and moist, dentition intact, ext ear canals clear,  Neck: Supple without JVD or lymphadenopathy Cardiovascular:     Rate and Rhythm: Tachycardia present.  Pulmonary:     Effort: Pulmonary effort is normal.     Comments: Back pain with deep breaths Musculoskeletal:        General: No swelling.  Neurological:     Mental Status: He is oriented to person, place, and time.     Sensory: Sensory deficit present.     Comments: Speech clear. Able to follow commands without difficulty. Back pain with LLE SLR. RLE  Significantly limited by pain--weak DF and unable to extend at knee with hip/thigh pain.  Decreased sensation below knee in the right leg.    Lab Results Last 24 Hours       Results for orders placed or performed during the hospital encounter of 12/19/19 (from the past 24 hour(s))  CBC     Status: Abnormal    Collection Time: 12/21/19  8:34 AM  Result Value Ref Range    WBC 12.8 (  H) 4.0 - 10.5 K/uL    RBC 2.64 (L) 4.22 - 5.81 MIL/uL    Hemoglobin 9.2 (L) 13.0 - 17.0 g/dL    HCT 60.427.2 (L) 39 - 52 %    MCV 103.0 (H) 80.0 - 100.0 fL    MCH 34.8 (H) 26.0 - 34.0 pg    MCHC 33.8 30.0 - 36.0 g/dL    RDW 54.013.6 98.111.5 - 19.115.5 %    Platelets 112 (L) 150 - 400 K/uL    nRBC 0.0 0.0 - 0.2 %  Basic metabolic panel     Status: Abnormal    Collection Time: 12/21/19  8:34 AM  Result Value Ref Range    Sodium 134 (L)  135 - 145 mmol/L    Potassium 3.7 3.5 - 5.1 mmol/L    Chloride 99 98 - 111 mmol/L    CO2 26 22 - 32 mmol/L    Glucose, Bld 161 (H) 70 - 99 mg/dL    BUN 6 6 - 20 mg/dL    Creatinine, Ser 4.781.03 0.61 - 1.24 mg/dL    Calcium 8.7 (L) 8.9 - 10.3 mg/dL    GFR calc non Af Amer >60 >60 mL/min    GFR calc Af Amer >60 >60 mL/min    Anion gap 9 5 - 15       Imaging Results (Last 48 hours)  DG Lumbar Spine 2-3 Views   Result Date: 12/19/2019 CLINICAL DATA:  L1 through L5 fusion. EXAM: DG C-ARM 1-60 MIN; LUMBAR SPINE - 2-3 VIEW FLUOROSCOPY TIME:  Fluoroscopy Time:  53 seconds Radiation Exposure Index (if provided by the fluoroscopic device): 15.19 mGy Number of Acquired Spot Images: 5 COMPARISON:  CT earlier this day. FINDINGS: Five fluoroscopic spot views of the lumbar spine obtained in the operating room in frontal and lateral projections. Intrapedicular screws at L1, L2, L4, and L5. Fluoroscopy time as described above. IMPRESSION: Intraoperative fluoroscopy during lumbar surgery. Electronically Signed   By: Narda RutherfordMelanie  Sanford M.D.   On: 12/19/2019 15:58    DG C-Arm 1-60 Min   Result Date: 12/19/2019 CLINICAL DATA:  L1 through L5 fusion. EXAM: DG C-ARM 1-60 MIN; LUMBAR SPINE - 2-3 VIEW FLUOROSCOPY TIME:  Fluoroscopy Time:  53 seconds Radiation Exposure Index (if provided by the fluoroscopic device): 15.19 mGy Number of Acquired Spot Images: 5 COMPARISON:  CT earlier this day. FINDINGS: Five fluoroscopic spot views of the lumbar spine obtained in the operating room in frontal and lateral projections. Intrapedicular screws at L1, L2, L4, and L5. Fluoroscopy time as described above. IMPRESSION: Intraoperative fluoroscopy during lumbar surgery. Electronically Signed   By: Narda RutherfordMelanie  Sanford M.D.   On: 12/19/2019 15:58         Assessment/Plan: Diagnosis: L3 burst fracture s/p L3 decompressive laminectomy with decompression of the spinal cord 1. Does the need for close, 24 hr/day medical supervision in concert  with the patient's rehab needs make it unreasonable for this patient to be served in a less intensive setting? Yes 2. Co-Morbidities requiring supervision/potential complications: active smoker, s/p fall, active alcohol user, RLE pain, lower back pain 3. Due to bladder management, bowel management, safety, skin/wound care, disease management, medication administration, pain management and patient education, does the patient require 24 hr/day rehab nursing? Yes 4. Does the patient require coordinated care of a physician, rehab nurse, therapy disciplines of PT, OT to address physical and functional deficits in the context of the above medical diagnosis(es)? Yes Addressing deficits in the following areas: balance, endurance, locomotion,  strength, transferring, bowel/bladder control, bathing, dressing, feeding, grooming, toileting and psychosocial support 5. Can the patient actively participate in an intensive therapy program of at least 3 hrs of therapy per day at least 5 days per week? Yes 6. The potential for patient to make measurable gains while on inpatient rehab is excellent 7. Anticipated functional outcomes upon discharge from inpatient rehab are min assist  with PT, min assist with OT, independent with SLP. 8. Estimated rehab length of stay to reach the above functional goals is: 20-22 days 9. Anticipated discharge destination: Home 10. Overall Rehab/Functional Prognosis: excellent   RECOMMENDATIONS: This patient's condition is appropriate for continued rehabilitative care in the following setting: CIR Patient has agreed to participate in recommended program. Yes Note that insurance prior authorization may be required for reimbursement for recommended care.   Comment: Mr. Fredell has significant rehabilitation needs and would be a good CIR candidate if appropriate disposition can be determined (he does not have family support), and if he is agreeable to cost (he does not have insurance), and  once medically stable (currently requiring IV dilaudid). He is moving his bowels and bladder. He would like resources to help him find a job again eventually- he has a great attitude and is very hard working. His pain is well controlled with current regimen.    Jacquelynn Cree, PA-C 12/21/2019    I have personally performed a face to face diagnostic evaluation, including, but not limited to relevant history and physical exam findings, of this patient and developed relevant assessment and plan.  Additionally, I have reviewed and concur with the physician assistant's documentation above.   Gwyneth Revels, MD

## 2019-12-23 ENCOUNTER — Inpatient Hospital Stay (HOSPITAL_COMMUNITY): Payer: Self-pay | Admitting: Physical Therapy

## 2019-12-23 ENCOUNTER — Inpatient Hospital Stay (HOSPITAL_COMMUNITY): Payer: Self-pay

## 2019-12-23 LAB — CBC WITH DIFFERENTIAL/PLATELET
Abs Immature Granulocytes: 0.03 10*3/uL (ref 0.00–0.07)
Basophils Absolute: 0 10*3/uL (ref 0.0–0.1)
Basophils Relative: 0 %
Eosinophils Absolute: 0.1 10*3/uL (ref 0.0–0.5)
Eosinophils Relative: 1 %
HCT: 22.7 % — ABNORMAL LOW (ref 39.0–52.0)
Hemoglobin: 7.8 g/dL — ABNORMAL LOW (ref 13.0–17.0)
Immature Granulocytes: 0 %
Lymphocytes Relative: 22 %
Lymphs Abs: 2 10*3/uL (ref 0.7–4.0)
MCH: 35.3 pg — ABNORMAL HIGH (ref 26.0–34.0)
MCHC: 34.4 g/dL (ref 30.0–36.0)
MCV: 102.7 fL — ABNORMAL HIGH (ref 80.0–100.0)
Monocytes Absolute: 0.9 10*3/uL (ref 0.1–1.0)
Monocytes Relative: 10 %
Neutro Abs: 6.2 10*3/uL (ref 1.7–7.7)
Neutrophils Relative %: 67 %
Platelets: 162 10*3/uL (ref 150–400)
RBC: 2.21 MIL/uL — ABNORMAL LOW (ref 4.22–5.81)
RDW: 13 % (ref 11.5–15.5)
WBC: 9.3 10*3/uL (ref 4.0–10.5)
nRBC: 0 % (ref 0.0–0.2)

## 2019-12-23 LAB — COMPREHENSIVE METABOLIC PANEL
ALT: 16 U/L (ref 0–44)
AST: 23 U/L (ref 15–41)
Albumin: 2.4 g/dL — ABNORMAL LOW (ref 3.5–5.0)
Alkaline Phosphatase: 32 U/L — ABNORMAL LOW (ref 38–126)
Anion gap: 8 (ref 5–15)
BUN: 6 mg/dL (ref 6–20)
CO2: 28 mmol/L (ref 22–32)
Calcium: 8.7 mg/dL — ABNORMAL LOW (ref 8.9–10.3)
Chloride: 99 mmol/L (ref 98–111)
Creatinine, Ser: 0.87 mg/dL (ref 0.61–1.24)
GFR calc Af Amer: >60 mL/min (ref >60)
GFR calc non Af Amer: >60 mL/min (ref >60)
Glucose, Bld: 94 mg/dL (ref 70–99)
Potassium: 3.9 mmol/L (ref 3.5–5.1)
Sodium: 135 mmol/L (ref 135–145)
Total Bilirubin: 0.7 mg/dL (ref 0.3–1.2)
Total Protein: 5.2 g/dL — ABNORMAL LOW (ref 6.5–8.1)

## 2019-12-23 LAB — MAGNESIUM: Magnesium: 1.7 mg/dL (ref 1.7–2.4)

## 2019-12-23 MED ORDER — MELATONIN 3 MG PO TABS
3.0000 mg | ORAL_TABLET | Freq: Every day | ORAL | Status: DC
  Administered 2019-12-23 – 2020-01-06 (×15): 3 mg via ORAL
  Filled 2019-12-23 (×15): qty 1

## 2019-12-23 MED ORDER — MAGNESIUM SULFATE 2 GM/50ML IV SOLN
2.0000 g | Freq: Once | INTRAVENOUS | Status: AC
  Administered 2019-12-23: 2 g via INTRAVENOUS
  Filled 2019-12-23: qty 50

## 2019-12-23 NOTE — Progress Notes (Signed)
Physical Therapy Session Note  Patient Details  Name: Colton Nichols MRN: [id number] Date of Birth: 08-23-1965  Today's Date: 12/23/2019 PT Individual Time: 1430-1540 PT Individual Time Calculation (min): 70 min   Short Term Goals: Week 1:  PT Short Term Goal 1 (Week 1): Pt will complete least restrictive transfer with CGA consistently PT Short Term Goal 2 (Week 1): Pt will ambulate x 150 ft with LRAD and CGA PT Short Term Goal 3 (Week 1): Pt will navigate 6" stairs with min A  Skilled Therapeutic Interventions/Progress Updates:    Pt received seated EOB eating his lunch. This therapist returned after 15 min to allow patient time to finish eating lunch. Once therapist return pt agreeable to participate in therapy session. Pt reports 5/10 pain in his LLE at rest. Pt with no further complaints of LLE pain during session but does report onset of R hip pain with standing activities. Assisted pt with setting up kpad and applied kpad to R hip and low back at end of session for pain management. Pt is min A for sit to stand to RW throughout session. Ambulation x 125 ft with RW and min A for balance. Pt exhibits flexed trunk posture, antalgic gait pattern, decreased WBing through RLE with flexed knee in stance, step-to gait pattern and significantly decreased gait speed. Provided cues for heel strike during gait. Pt exhibits improved gait mechanics during second bout of ambulation x 150 ft with RW and min A. Car transfer with min A and RW with cues for safe transfer technique. Ascend/descend 8 x 3" stairs with 2 handrails and min A, step-to gait pattern. Pt continues to exhibit heavy reliance on BUE in standing with either RW or stair rails. Pt agreeable to stay seated in recliner at end of session. Pt left semi-reclined in recliner with needs in reach, chair alarm in place at end of session.  Therapy Documentation Precautions:  Precautions Precautions: Back, Fall Precaution Comments: rt leg buckles  with sit to/from stand transition Required Braces or Orthoses: Spinal Brace Spinal Brace: Lumbar corset, Applied in sitting position Restrictions Weight Bearing Restrictions: No   Therapy/Group: Individual Therapy   Peter Congo, PT, DPT  12/23/2019, 3:52 PM

## 2019-12-23 NOTE — Progress Notes (Signed)
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 No pertinent past medical history. Past Surgical History:  Past Surgical History:  Procedure Laterality Date  . LACERATION REPAIR    . POSTERIOR LUMBAR FUSION 4 LEVEL N/A 12/19/2019   Procedure: Lumbar Three Decompressive Laminectomy with Transpedicular Decompression, Lumbar one - Lumbar five Posterior Lateral Arthrodesis utilizing segmental pedicle screw fixation and local autografting.;  Surgeon: Earnie Larsson, MD;  Location: [facility];  Service: Neurosurgery;  Laterality: N/A;    Assessment & Plan Clinical Impression: Pt is a 54 yo male s/p fall at work L3 burst fracture with cauda equina injury which is incomplete. Pt requiring L3 decompressive laminectomy with transpedicular decompression followed by L1-L5 posterior lateral arthrodesis. PMHx: current smoker.  Patient currently requires min with basic self-care skills secondary to muscle weakness, decreased cardiorespiratoy endurance, impaired timing and sequencing, decreased coordination and decreased motor planning and decreased sitting balance, decreased standing balance, decreased postural control, decreased balance strategies and difficulty maintaining precautions.  Prior to hospitalization, patient could complete BADL/IADL/vocation with independent .  Patient will benefit from skilled intervention to decrease level of assist with basic self-care skills and increase independence with basic self-care skills prior  to discharge home with care partner.  Anticipate patient will require intermittent supervision and follow up home health.  OT - End of Session Activity Tolerance: Tolerates 30+ min activity with multiple rests Endurance Deficit: Yes OT Assessment Rehab Potential (ACUTE ONLY): Good OT Barriers to Discharge: Decreased caregiver support;Lack of/limited family support;Home environment access/layout;Inaccessible home environment OT Patient demonstrates impairments in the following area(s): Balance;Cognition;Endurance;Motor;Pain;Safety;Sensory OT Basic ADL's Functional Problem(s): Grooming;Bathing;Dressing;Toileting OT Advanced ADL's Functional Problem(s): Simple Meal Preparation;Laundry OT Transfers Functional Problem(s): Toilet;Tub/Shower OT Additional Impairment(s): None OT Plan OT Intensity: Minimum of 1-2 x/day, 45 to 90 minutes OT Frequency: 5 out of 7 days OT Duration/Estimated Length of Stay: 10-14 OT Treatment/Interventions: Balance/vestibular training;Discharge planning;Pain management;Self Care/advanced ADL retraining;Therapeutic Activities;UE/LE Coordination activities;Therapeutic Exercise;Skin care/wound managment;Patient/family education;Functional mobility training;Disease mangement/prevention;Community reintegration;DME/adaptive equipment instruction;Neuromuscular re-education;Psychosocial support;Splinting/orthotics;UE/LE Strength taining/ROM;Wheelchair propulsion/positioning OT Self Feeding Anticipated Outcome(s): MOD I OT Basic Self-Care Anticipated Outcome(s): MOD I OT Toileting Anticipated Outcome(s): MOD I OT Bathroom Transfers Anticipated Outcome(s): MOD I OT Recommendation Patient destination: Home Follow Up Recommendations: Home health OT Equipment Recommended: 3 in 1 bedside comode;Tub/shower seat;To be determined Equipment Details: mother may have all equipment   OT Evaluation Precautions/Restrictions  Precautions Precautions: Back;Fall Precaution Comments: rt  leg buckles with sit to/from stand transition Required Braces or Orthoses: Spinal Brace Spinal Brace: Lumbar corset;Applied in sitting position Restrictions Weight Bearing Restrictions: No General Chart Reviewed: Yes Family/Caregiver Present: No Vital Signs   Pain   5/10 repositioned for comfort Home Living/Prior [name] expects to be discharged to:: Private residence Living Arrangements: Other (Comment) (plans to live with mom upon discharge) Available Help at Discharge: Family, Available PRN/intermittently Type of Home: House Home Access: Stairs to enter Technical brewer of Steps: 3 Home Layout: Multi-level Alternate Level Stairs-Rails: Right Bathroom Shower/Tub: Multimedia programmer: Standard Additional Comments: Pt may go to her mother's home;  3 steps in front w/ rail; tub shower IADL History Homemaking Responsibilities: Yes Meal Prep Responsibility: Primary Laundry Responsibility: Primary Cleaning Responsibility: Primary Bill Paying/Finance Responsibility: Primary Shopping Responsibility: Primary Current License: Yes Prior Function Level of Independence: Independent with basic ADLs, Independent with homemaking with ambulation  Able to Take Stairs?: Yes Vocation: Full time employment Vision Baseline Vision/History: No visual deficits Patient Visual Report: No change from baseline Vision Assessment?: No apparent visual deficits Perception  Perception: Within Functional Limits Praxis Praxis: Impaired Praxis Impairment Details: Motor planning Cognition Overall Cognitive Status: Within Functional Limits for tasks assessed Arousal/Alertness: Awake/alert Orientation Level: Person;Place;Situation Person: Oriented Place: Oriented Situation: Oriented Year: 2021 Month: August Day of Week: Correct Memory: Appears intact Immediate Memory Recall: Sock;Blue;Bed Memory Recall Sock: Without Cue Memory Recall Blue: Without  Cue Memory Recall Bed: Without Cue Sensation Sensation Light Touch: Impaired by gross assessment (RLE decreased sensation) Coordination Gross Motor Movements are Fluid and Coordinated: Yes (UE) Fine Motor Movements are Fluid and Coordinated: Yes (UEs) Finger Nose Finger Test: WNL Motor  Motor Motor: Abnormal postural alignment and control Motor - Skilled Clinical Observations: BLE weakness RLE>LLE  Trunk/Postural Assessment  Cervical Assessment Cervical Assessment: Within Functional Limits Thoracic Assessment Thoracic Assessment: Within Functional Limits Lumbar Assessment Lumbar Assessment: Exceptions to WFL (LSO) Postural Control Postural Control: Deficits on evaluation  Balance Balance Balance Assessed: Yes Dynamic Sitting Balance Dynamic Sitting - Level of Assistance: 5: Stand by assistance Sitting balance - Comments: UE support for pain not balance Static Standing Balance Static Standing - Level of Assistance: 4: Min assist Extremity/Trunk Assessment RUE Assessment RUE Assessment: Within Functional Limits    Care Tool Care Tool Self Care Eating        Oral Care    Oral Care Assist Level: Set up assist    Bathing Bathing activity did not occur: Refused            Upper Body Dressing(including orthotics) Upper body dressing/undressing activity did not occur (including orthotics): Refused          Lower Body Dressing (excluding footwear) Lower body dressing activity did not occur: Refused        Putting on/Taking off footwear   What is the patient wearing?: Ted hose;Non-skid slipper socks Assist for footwear: Moderate Assistance - Patient 50 - 74%       Care Tool Toileting Toileting activity   Assist for toileting: Moderate Assistance - Patient 50 - 74%     Care Tool Bed Mobility Roll left and right activity        Sit to lying activity        Lying to sitting edge of bed activity         Care Tool Transfers Sit to stand transfer   Sit  to stand assist level: Minimal Assistance - Patient > 75%    Chair/bed transfer   Chair/bed transfer assist level: Minimal Assistance - Patient > 75%     Toilet transfer   Assist Level: Minimal Assistance - Patient > 75%     Care Tool Cognition Expression of Ideas and Wants     Understanding Verbal and Non-Verbal Content     Memory/Recall Ability *first 3 days only      Refer to Care Plan for Long Term Goals  SHORT TERM GOAL WEEK 1 OT Short Term Goal 1 (Week 1): Pt will transfer to toilet wiht S overall OT Short Term Goal 2 (Week 1): Pt will complete 3/3 toileting components with CGA OT Short  Term Goal 3 (Week 1): Pt will recall 3/3 back precautions OT Short Term Goal 4 (Week 1): Pt will footwear wiht AE PRN at S level  Recommendations for other services: Surveyor, mining group and Outing/community reintegration   Skilled Therapeutic Intervention  Pt received in bed agreeable to OT after education on role/purpose, CIR, ELOS, POC, goals, but declining bathing and dressing this date as he washed up and dressed with PT. Pt completes supine>sitting EOB with CGA and MIN A with BUE on walker to power up into standing. Pt ambulates with RW to [facility] and transfers onto toilet with MIN A. Pt requires VC for placing RUE back on armrest to control decent to chair. Pt educated on shower chair for shower stall transfer v TTB for tub transfer as well as adaptations for safety/decreasing fall risk in the bathroom. Pt able to step over small ledge in shower using posterior method. OT educates on stretches for B hips to improve ability to access feet for LB dressing. Pt began education on alternative pain management strategies, energy conservation and AE to use PRN. Exited session with pt seated EOB, exit alarm on and call light in reach     ADL ADL Grooming: Setup Where Assessed-Grooming: Sitting at sink Lower Body Dressing: Supervision/safety (footwear only) Where Assessed-Lower Body  Dressing: Wheelchair Toileting: Moderate assistance (simulated) Where Assessed-Toileting: Bedside Commode Toilet Transfer: Minimal assistance Toilet Transfer Equipment: Bedside commode;Grab bars (RW) Walk-In Shower Transfer: Environmental education officer Method: Stand pivot;Ambulating Advertising account executive with back Mobility  Transfers Sit to Stand: Minimal Assistance - Patient > 75% Stand to Sit: Minimal Assistance - Patient > 75%   Discharge Criteria: Patient will be discharged from OT if patient refuses treatment 3 consecutive times without medical reason, if treatment goals not met, if there is a change in medical status, if patient makes no progress towards goals or if patient is discharged from hospital.  The above assessment, treatment plan, treatment alternatives and goals were discussed and mutually agreed upon: by patient  Tonny Branch 12/23/2019, 12:12 PM

## 2019-12-23 NOTE — Progress Notes (Signed)
Inpatient Rehabilitation  Patient information reviewed and entered into eRehab system by Tan Clopper M. Uzair Godley, M.A., CCC/SLP, PPS Coordinator.  Information including medical coding, functional ability and quality indicators will be reviewed and updated through discharge.    

## 2019-12-23 NOTE — Plan of Care (Signed)
  Problem: RH SKIN INTEGRITY Goal: RH STG SKIN FREE OF INFECTION/BREAKDOWN Description: Remain free of infection/further breakdown during rehab stay  Outcome: Progressing   Problem: RH SAFETY Goal: RH STG ADHERE TO SAFETY PRECAUTIONS W/ASSISTANCE/DEVICE Description: STG Adhere to Safety Precautions With min Assistance/Device. Outcome: Progressing

## 2019-12-23 NOTE — Progress Notes (Signed)
[facility] PHYSICAL MEDICINE & REHABILITATION PROGRESS NOTE   Subjective/Complaints: No complaints this morning Ordered kpad for muscular pains. Hgb dropped to 7.8 Sleeping poorly at night  ROS: Denies constipation. +pain, insomnia   Objective:   No results found. Recent Labs    12/21/19 0834 12/23/19 0554  WBC 12.8* 9.3  HGB 9.2* 7.8*  HCT 27.2* 22.7*  PLT 112* 162   Recent Labs    12/21/19 0834 12/23/19 0554  NA 134* 135  K 3.7 3.9  CL 99 99  CO2 26 28  GLUCOSE 161* 94  BUN 6 6  CREATININE 1.03 0.87  CALCIUM 8.7* 8.7*    Intake/Output Summary (Last 24 hours) at 12/23/2019 1210 Last data filed at 12/23/2019 0245 Gross per 24 hour  Intake --  Output 400 ml  Net -400 ml     Physical Exam: Vital Signs Blood pressure 122/83, pulse 92, temperature 98.1 F (36.7 C), resp. rate 18, height 6\' 1"  (1.854 m), weight 58.3 kg, SpO2 100 %.  General: Alert and oriented x 3, No apparent distress HEENT: Head is normocephalic, atraumatic, PERRLA, EOMI, sclera anicteric, oral mucosa pink and moist, dentition intact, ext ear canals clear,  Neck: Supple without JVD or lymphadenopathy Heart: Reg rate and rhythm. No murmurs rubs or gallops Chest: CTA bilaterally without wheezes, rales, or rhonchi; no distress Abdomen: Soft, non-tender, non-distended, bowel sounds positive. Extremities: No clubbing, cyanosis, or edema. Pulses are 2+ Skin: Contusion present on head Neuro: Speech clear. Able to follow commands without difficulty. Significant back pain with attempts at RLE flexion/extenion.   Psych: Pt's affect is appropriate. Pt is cooperative     Assessment/Plan: 1. Functional deficits secondary to cauda equina syndrome which require 3+ hours per day of interdisciplinary therapy in a comprehensive inpatient rehab setting.  Physiatrist is providing close team supervision and 24 hour management of active medical problems listed below.  Physiatrist and rehab team continue to  assess barriers to discharge/monitor patient progress toward functional and medical goals  Care Tool:  Bathing  Bathing activity did not occur: Refused           Bathing assist       Upper Body Dressing/Undressing Upper body dressing Upper body dressing/undressing activity did not occur (including orthotics): Refused      Upper body assist      Lower Body Dressing/Undressing Lower body dressing    Lower body dressing activity did not occur: Refused       Lower body assist       Toileting Toileting    Toileting assist Assist for toileting: Moderate Assistance - Patient 50 - 74%     Transfers Chair/bed transfer  Transfers assist     Chair/bed transfer assist level: Minimal Assistance - Patient > 75%     Locomotion Ambulation   Ambulation assist              Walk 10 feet activity   Assist           Walk 50 feet activity   Assist           Walk 150 feet activity   Assist           Walk 10 feet on uneven surface  activity   Assist           Wheelchair     Assist               Wheelchair 50 feet with 2 turns activity  Assist            Wheelchair 150 feet activity     Assist          Blood pressure 122/83, pulse 92, temperature 98.1 F (36.7 C), resp. rate 18, height 6\' 1"  (1.854 m), weight 58.3 kg, SpO2 100 %.  Medical Problem List and Plan: 1.  Impaired mobility and ADLs secondary to cauda equina syndrome             -patient may shower but incision must be covered.              -ELOS/Goals: 7-8 days modI  -Initial CIR evals today 2.  Antithrombotics: -DVT/anticoagulation:  Mechanical: Sequential compression devices, below knee Bilateral lower extremities Order dopplers for am- not done yet             -antiplatelet therapy: N/A 3. Pain Management:  Oxycodone and flexeril prn. Add Oxycontin 10 mg bid for consistent pain relief--still looks in quite a bit of pain. Will add  gabapentin tid for neuropathy also. Add kpad. 4. Mood: LCSW to follow for evaluation and support.              -antipsychotic agents: N/A 5. Neuropsych: This patient is capable of making decisions on his own behalf. 6. Skin/Wound Care: Monitor incision for healing.   7. Fluids/Electrolytes/Nutrition: Monitor I/O. Add protein supplement for low calorie malnutrition. K+ 3.7 on 8/2.  Normal on 8/4  8. Leucocytosis: Likely reactive--17.1-->12.8. Normal on 8/4 9. ABLA: Hgb 9.2 on 8/2. Recheck in am.  10. Thrombocytopenia: Progressive--202-->112.  11. Hyponatremia/Hyperglycemia: Na 134 on 8/2. Na 134 today  12. Constipation: Schedule Miralax daily in am --effective.   13. HTN: Monitor BP TID -- elevated likely due to poorly controlled pain.  14. Hypomagnesemia: supplement with IV mag 2mg  today and repeat tomorrow.     LOS: 1 days A FACE TO FACE EVALUATION WAS PERFORMED  10/4 P Carlyle Mcelrath 12/23/2019, 12:10 PM

## 2019-12-23 NOTE — Progress Notes (Signed)
This nurse gave patient PM medications last night and educated patient on PRN pain medications, and to call if he was in pain. This nurse rounded on patient throughout shift, waking him twice to bladder scan. This nurse asked if patient needed anything. Patient said no. This morning, patient complained of pain, stating "I asked for medicine and no one checked on me." This nurse reoriented patient and informed him that he had been checked on and asked if he was okay throughout night. Patient replied "I must have been out of it because I was in so much pain." This nurse gave patient PRN oxycodone for severe pain at 06:46. Patient re-educated on call-light and pain medication parameters.

## 2019-12-23 NOTE — Evaluation (Signed)
Physical Therapy Assessment and Plan  Patient Details  Name: Colton Nichols MRN: [id number] Date of Birth: 10/18/1965  PT Diagnosis: Abnormal posture, Abnormality of gait, Difficulty walking, Impaired sensation, Low back pain, Muscle weakness and Paraplegia Rehab Potential: Good ELOS: 10-14 days   Today's Date: 12/23/2019 PT Individual Time: 0800-0900 PT Individual Time Calculation (min): 60 min    Hospital Problem: Principal Problem:   Cauda equina syndrome ([place])   Past Medical History: History reviewed. No pertinent past medical history. Past Surgical History:  Past Surgical History:  Procedure Laterality Date  . LACERATION REPAIR    . POSTERIOR LUMBAR FUSION 4 LEVEL N/A 12/19/2019   Procedure: Lumbar Three Decompressive Laminectomy with Transpedicular Decompression, Lumbar one - Lumbar five Posterior Lateral Arthrodesis utilizing segmental pedicle screw fixation and local autografting.;  Surgeon: Earnie Larsson, MD;  Location: [place];  Service: Neurosurgery;  Laterality: N/A;    Assessment & Plan Clinical Impression:  Colton Nichols is a 54 year old male in relatively good health who was admitted on 12/19/19 after a fall work. He struck the back of his head then landed on his buttocks and developed severe low back pain shooting in BLE as well as fecal incontinence. He was found to have L3 burst fracture with 1 cm retropulsion with pronounced canal compromise and fracture of bilateral spinal processes. He was evaluated by Dr. Annette Stable found to have mild sensory deficits in L4/5 and S1 dermatomes as well as cauda equina symptoms. He was taken to OR emergently for L3 decompressive Laminectomy with decompression of spinal canal for open reduction of L3 fracture on the same day.  Follow up labs reveal ABLA as well as thrombocytopenia. He continues to have RLE>LLE weakness with sensory deficits, neuropathy and pain affecting ADLs and mobility. CIR recommended due to functional deficits. Patient  transferred to CIR on 12/22/2019 .   Patient currently requires min with mobility secondary to muscle weakness, decreased cardiorespiratoy endurance, abnormal tone, unbalanced muscle activation, decreased coordination and decreased motor planning and decreased sitting balance, decreased standing balance, decreased postural control, decreased balance strategies and difficulty maintaining precautions.  Prior to hospitalization, patient was independent  with mobility and lived with Alone in a House home.  Home access is 3Stairs to enter.  Patient will benefit from skilled PT intervention to maximize safe functional mobility, minimize fall risk and decrease caregiver burden for planned discharge home with intermittent assist.  Anticipate patient will benefit from follow up OP at discharge.  PT - End of Session Activity Tolerance: Tolerates 30+ min activity with multiple rests Endurance Deficit: Yes Endurance Deficit Description: frequent rest breaks during functional activity PT Assessment Rehab Potential (ACUTE/IP ONLY): Good PT Barriers to Discharge: Decreased caregiver support;Home environment access/layout PT Patient demonstrates impairments in the following area(s): Balance;Endurance;Motor;Pain;Safety;Sensory PT Transfers Functional Problem(s): Bed Mobility;Bed to Chair;Car;Furniture;Floor PT Locomotion Functional Problem(s): Ambulation;Wheelchair Mobility;Stairs PT Plan PT Intensity: Minimum of 1-2 x/day ,45 to 90 minutes PT Frequency: 5 out of 7 days PT Duration Estimated Length of Stay: 10-14 days PT Treatment/Interventions: Ambulation/gait training;Balance/vestibular training;Community reintegration;Discharge planning;Disease management/prevention;DME/adaptive equipment instruction;Functional mobility training;Neuromuscular re-education;Pain management;Patient/family education;Psychosocial support;Splinting/orthotics;Stair training;Therapeutic Activities;Therapeutic Exercise;UE/LE Strength  taining/ROM;UE/LE Coordination activities PT Transfers Anticipated Outcome(s): mod I PT Locomotion Anticipated Outcome(s): mod I with LRAD PT Recommendation Recommendations for Other Services: Neuropsych consult;Therapeutic Recreation consult Therapeutic Recreation Interventions: Stress management Follow Up Recommendations: Outpatient PT Patient destination: Home Equipment Recommended: Rolling walker with 5" wheels Equipment Details: further equipment TBD pending progress   PT Evaluation Precautions/Restrictions Precautions Precautions:  Back;Fall Precaution Comments: reviewed back precautions; RLE buckles at times Required Braces or Orthoses: Spinal Brace Spinal Brace: Lumbar corset;Applied in sitting position Restrictions Weight Bearing Restrictions: No Home Living/Prior Functioning Home Living Available Help at Discharge: Family;Available PRN/intermittently Type of Home: House Home Access: Stairs to enter [employer] of Steps: 3 Entrance Stairs-Rails: Right Home Layout: Multi-level Alternate Level Stairs-Number of Steps: 15 Alternate Level Stairs-Rails: Right Additional Comments: Pt may go to his mother's home; 3 steps in front w/ rail; tub shower  Lives With: Alone Prior Function Level of Independence: Independent with gait;Independent with transfers  Able to Take Stairs?: Yes Driving: No Vocation: Full time employment Vocation Requirements: works in tobacco Leisure: Hobbies-yes (Comment) Comments: cooking Vision/Perception  Perception Perception: Within Functional Limits Praxis Praxis: Impaired Praxis Impairment Details: Motor planning  Cognition Overall Cognitive Status: Within Functional Limits for tasks assessed Arousal/Alertness: Awake/alert Orientation Level: Oriented X4 Attention: Focused Focused Attention: Appears intact Memory: Appears intact Awareness: Appears intact Problem Solving: Appears intact Safety/Judgment: Appears  intact Sensation Sensation Light Touch: Impaired Detail Light Touch Impaired Details: Impaired RLE (reports N/T/burning) Proprioception: Impaired by gross assessment (impaired per pt report; appears WFL in functional context) Coordination Gross Motor Movements are Fluid and Coordinated: No Fine Motor Movements are Fluid and Coordinated: Yes Coordination and Movement Description: impaired 2/2 BLE weakness and pain Motor  Motor Motor: Abnormal postural alignment and control;Paraplegia Motor - Skilled Clinical Observations: BLE weakness RLE>LLE  Trunk/Postural Assessment  Cervical Assessment Cervical Assessment: Within Functional Limits Thoracic Assessment Thoracic Assessment: Within Functional Limits Lumbar Assessment Lumbar Assessment: Exceptions to [facility] (lumbar corset; back precautions) Postural Control Postural Control: Deficits on evaluation  Balance Balance Balance Assessed: Yes Static Sitting Balance Static Sitting - Balance Support: No upper extremity supported;Feet supported Static Sitting - Level of Assistance: 5: Stand by assistance Dynamic Sitting Balance Dynamic Sitting - Balance Support: No upper extremity supported;Feet supported;During functional activity Dynamic Sitting - Level of Assistance: 5: Stand by assistance Static Standing Balance Static Standing - Balance Support: Bilateral upper extremity supported;During functional activity Static Standing - Level of Assistance: 4: Min assist Dynamic Standing Balance Dynamic Standing - Balance Support: Bilateral upper extremity supported;During functional activity Dynamic Standing - Level of Assistance: 4: Min assist Extremity Assessment   RLE Assessment RLE Assessment: Exceptions to [facility] Passive Range of Motion (PROM) Comments: tight HS General Strength Comments: impaired, see below RLE Strength Right Hip Flexion: 2+/5 Right Knee Flexion: 4/5 Right Knee Extension: 2+/5 Right Ankle Dorsiflexion: 2+/5 LLE  Assessment LLE Assessment: Exceptions to [facility] Passive Range of Motion (PROM) Comments: tight HS General Strength Comments: impaired, see below LLE Strength Left Hip Flexion: 3/5 Left Knee Flexion: 4/5 Left Knee Extension: 5/5 Left Ankle Dorsiflexion: 5/5  Care Tool Care Tool Bed Mobility Roll left and right activity   Roll left and right assist level: Minimal Assistance - Patient > 75%    Sit to lying activity   Sit to lying assist level: Minimal Assistance - Patient > 75%    Lying to sitting edge of bed activity   Lying to sitting edge of bed assist level: Moderate Assistance - Patient 50 - 74%     Care Tool Transfers Sit to stand transfer   Sit to stand assist level: Minimal Assistance - Patient > 75%    Chair/bed transfer   Chair/bed transfer assist level: Minimal Assistance - Patient > 75%     Physiological scientist transfer assist level:  Minimal Assistance - Patient > 75%      Care Tool Locomotion Ambulation   Assist level: Minimal Assistance - Patient > 75% Assistive device: Walker-rolling Max distance: 150'  Walk 10 feet activity   Assist level: Minimal Assistance - Patient > 75% Assistive device: Walker-rolling   Walk 50 feet with 2 turns activity   Assist level: Minimal Assistance - Patient > 75% Assistive device: Walker-rolling  Walk 150 feet activity   Assist level: Minimal Assistance - Patient > 75% Assistive device: Walker-rolling  Walk 10 feet on uneven surfaces activity Walk 10 feet on uneven surfaces activity did not occur: Safety/medical concerns      Stairs   Assist level: Minimal Assistance - Patient > 75% Stairs assistive device: 2 hand rails Max number of stairs: 8 (3")  Walk up/down 1 step activity   Walk up/down 1 step (curb) assist level: Minimal Assistance - Patient > 75% Walk up/down 1 step or curb assistive device: 2 hand rails    Walk up/down 4 steps activity Walk up/down 4 steps assist level: Minimal  Assistance - Patient > 75% Walk up/down 4 steps assistive device: 2 hand rails  Walk up/down 12 steps activity Walk up/down 12 steps activity did not occur: Safety/medical concerns      Pick up small objects from floor Pick up small object from the floor (from standing position) activity did not occur: Safety/medical concerns      Wheelchair Will patient use wheelchair at discharge?: No          Wheel 50 feet with 2 turns activity      Wheel 150 feet activity        Refer to Care Plan for Long Term Goals  SHORT TERM GOAL WEEK 1 PT Short Term Goal 1 (Week 1): Pt will complete least restrictive transfer with CGA consistently PT Short Term Goal 2 (Week 1): Pt will ambulate x 150 ft with LRAD and CGA PT Short Term Goal 3 (Week 1): Pt will navigate 6" stairs with min A  Recommendations for other services: Neuropsych and Therapeutic Recreation  Stress management  Skilled Therapeutic Intervention Evaluation completed (see details above and below) with education on PT POC and goals and individual treatment initiated with focus on functional transfer assessment, orientation to rehab unit and schedule. Pt received seated in bed, agreeable to PT session. Pt reports pain in R hip and low back at rest, not rated. Pt premedicated prior to start of therapy session and provided hot pack at end of session for pain relief. Supine to sit with min A for some trunk control, increased time needed with HOB elevated and use of bedrail. Assisted pt with changing out of hospital gown into scrub top while seated EOB and assisted pt with donning lumbar corset. Assisted pt with donning knee-high TEDs and scrub pants while seated EOB. Sit to stand with min A to RW. Attempt to have pt stand utilizing one UE on RW and one on the bed, unable to achieve full upright stance safely. With use of BUE on RW and min A pt able to stand up. Stand pivot transfer to w/c. Pt reports increase in pain and discomfort while seated in  w/c. Stand pivot transfer back to bed. Assisted pt with doffing lumbar corset while seated EOB. Sit to supine mod A for BLE management. Pt left semi-reclined in bed with needs in reach, bed alarm in place at end of session.  Mobility Bed Mobility Bed Mobility: Rolling Right;Rolling Left;Supine to  Sit;Sit to Supine Rolling Right: Minimal Assistance - Patient > 75% Rolling Left: Minimal Assistance - Patient > 75% Supine to Sit: Minimal Assistance - Patient > 75% Sit to Supine: Moderate Assistance - Patient 50-74% Transfers Transfers: Sit to Stand;Stand to Sit;Stand Pivot Transfers Sit to Stand: Minimal Assistance - Patient > 75% Stand to Sit: Minimal Assistance - Patient > 75% Stand Pivot Transfers: Minimal Assistance - Patient > 75% Stand Pivot Transfer Details: Manual facilitation for weight shifting;Verbal cues for safe use of DME/AE;Verbal cues for precautions/safety Transfer (Assistive device): Rolling walker Locomotion  Gait Gait Distance (Feet): 150 Feet Assistive device: Rolling walker Gait Gait Pattern: Impaired (flexed trunk, antalgic, step-to, R knee flexed) Gait velocity: decreased Stairs / Additional Locomotion Stairs: Yes Stairs Assistance: Minimal Assistance - Patient > 75% Stair Management Technique: Two rails;Step to pattern Number of Stairs: 8 Height of Stairs: 3 Wheelchair Mobility Wheelchair Mobility: No   Discharge Criteria: Patient will be discharged from PT if patient refuses treatment 3 consecutive times without medical reason, if treatment goals not met, if there is a change in medical status, if patient makes no progress towards goals or if patient is discharged from hospital.  The above assessment, treatment plan, treatment alternatives and goals were discussed and mutually agreed upon: by patient   Excell Seltzer, PT, DPT 12/23/2019, 4:04 PM

## 2019-12-24 ENCOUNTER — Inpatient Hospital Stay (HOSPITAL_COMMUNITY): Payer: Self-pay | Admitting: Physical Therapy

## 2019-12-24 ENCOUNTER — Inpatient Hospital Stay (HOSPITAL_COMMUNITY): Payer: Self-pay

## 2019-12-24 LAB — CBC
HCT: 22 % — ABNORMAL LOW (ref 39.0–52.0)
Hemoglobin: 7.4 g/dL — ABNORMAL LOW (ref 13.0–17.0)
MCH: 34.9 pg — ABNORMAL HIGH (ref 26.0–34.0)
MCHC: 33.6 g/dL (ref 30.0–36.0)
MCV: 103.8 fL — ABNORMAL HIGH (ref 80.0–100.0)
Platelets: 213 10*3/uL (ref 150–400)
RBC: 2.12 MIL/uL — ABNORMAL LOW (ref 4.22–5.81)
RDW: 13.1 % (ref 11.5–15.5)
WBC: 8.6 10*3/uL (ref 4.0–10.5)
nRBC: 0 % (ref 0.0–0.2)

## 2019-12-24 LAB — IRON AND TIBC
Iron: 10 ug/dL — ABNORMAL LOW (ref 45–182)
Saturation Ratios: 5 % — ABNORMAL LOW (ref 17.9–39.5)
TIBC: 200 ug/dL — ABNORMAL LOW (ref 250–450)
UIBC: 190 ug/dL

## 2019-12-24 LAB — MAGNESIUM: Magnesium: 2.2 mg/dL (ref 1.7–2.4)

## 2019-12-24 MED ORDER — FERROUS SULFATE 325 (65 FE) MG PO TABS
325.0000 mg | ORAL_TABLET | Freq: Every day | ORAL | Status: DC
  Administered 2019-12-24 – 2020-01-07 (×14): 325 mg via ORAL
  Filled 2019-12-24 (×14): qty 1

## 2019-12-24 MED FILL — Sodium Chloride IV Soln 0.9%: INTRAVENOUS | Qty: 2000 | Status: AC

## 2019-12-24 MED FILL — Heparin Sodium (Porcine) Inj 1000 Unit/ML: INTRAMUSCULAR | Qty: 30 | Status: AC

## 2019-12-24 NOTE — Progress Notes (Signed)
Occupational Therapy Session Note  Patient Details  Name: Colton Nichols MRN: [id number] Date of Birth: 27-Nov-1965  Today's Date: 12/24/2019 OT Individual Time: 5809-9833 OT Individual Time Calculation (min): 73 min    Short Term Goals: Week 1:  OT Short Term Goal 1 (Week 1): Pt will transfer to toilet wiht S overall OT Short Term Goal 2 (Week 1): Pt will complete 3/3 toileting components with CGA OT Short Term Goal 3 (Week 1): Pt will recall 3/3 back precautions OT Short Term Goal 4 (Week 1): Pt will footwear wiht AE PRN at S level  Skilled Therapeutic Interventions/Progress Updates:    OT intervention with focus on BADL retraining, sitting balance, toileting, functional amb with RW, activity tolerance, and safety awareness to increase independence with BADLs. See Care Tool for assist levels.  Bed mobility with supervision using bed rails. Sit<>stand with CGA from variety of surface heights. Pt requires tot A for donning LSO. Functional amb with RW to bathroom to use toilet prior to transferring to shower. Pt returned to EOB to complete dressing tasks. Pt returned to bed and remained in bed with all needs within reach. Pt commented that the shower and associtated activity was exhausting. Bed alarm activated.   Therapy Documentation Precautions:  Precautions Precautions: Back, Fall Precaution Comments: reviewed back precautions; RLE buckles at times Required Braces or Orthoses: Spinal Brace Spinal Brace: Lumbar corset, Applied in sitting position Restrictions Weight Bearing Restrictions: No   Pain: Pt c/o 7/10 R hip and RLE; repositioned, RN aware, and emotional support  Therapy/Group: Individual Therapy  Rich Brave 12/24/2019, 8:15 AM

## 2019-12-24 NOTE — Progress Notes (Signed)
Physical Therapy Session Note  Patient Details  Name: Colton Nichols MRN: [id number] Date of Birth: 07-14-1965  Today's Date: 12/24/2019 PT Individual Time: 0915-1000 ; 1300-1415 PT Individual Time Calculation (min): 45 min and 75 min  Short Term Goals: Week 1:  PT Short Term Goal 1 (Week 1): Pt will complete least restrictive transfer with CGA consistently PT Short Term Goal 2 (Week 1): Pt will ambulate x 150 ft with LRAD and CGA PT Short Term Goal 3 (Week 1): Pt will navigate 6" stairs with min A  Skilled Therapeutic Interventions/Progress Updates:    Session 1: Pt received sidelying in bed, agreeable to PT session. Pt reports soreness in R hip 7/10 at rest from previous OT session. Pt premedicated prior to start of therapy session and use of kpad at end of session for pain management. Supine to sit with min A with increased time HOB elevated and use of bedrail. Pt is min A to don lumbar corset while seated EOB. Sit to stand with min A to RW throughout session. Pt continues to require use of BUE on RW when performing sit to stand to perform transfer completely and safely. Pt initially performs stand to sit with BUE on RW but exhibits decreased safety with this. Education about reaching back with one UE for arm of chair when sitting. Pt exhibits improved safety and control when performing stand to sit with use of one UE on arm of chair. Ambulation x 150 ft with RW and CGA to min A. Pt initially demonstrates step-to gait pattern and significantly decreased gait speed but improves to step-through gait with increased gait speed as ambulation distance progresses. Pt requests to return to bed at end of session. Sit to supine mod A for BLE management. Pt left sidelying in bed with needs in reach, bed alarm in place at end of session.  Session 2: Pt received seated EOB, agreeable to PT session. Pt reports ongoing soreness in R hip, not rated and declines intervention during session. Sit to stand with  min A to RW throughout session. Stand pivot transfer bed to w/c with RW and min A. Dependent transport via w/c to therapy gym for energy conservation. Ascend/descend 4 x 6" stairs with 2 handrails and min A, x 2 reps. Reviewed pt's back precautions (no bending, no lifting, no twisting). Pt then expresses concern regarding his d/c plan and being worried about him having to assist his mother and/or his mother having to assist him. Education with patient that plan is for him to reach mod I goals by d/c so his mother should not have to assist him physically upon d/c home and his mother is currently independent and should not require assist from him. Pt also expresses anxiety about being able to provide for and take care of his family with his current physical limitations. Provided emotional support to patient and will recommend neuropsych and recreational therapy see patient as well. Sit to/from supine on real bed in rehab apartment at [facility] level. Pt exhibits good adherence to back precautions with bed mobility and exhibits improved ability to perform bed mobility independently. Ambulation x 150 ft with RW and CGA back to room. Pt left seated in recliner in room with needs in reach, chair alarm in place, kpad to low back for pain management at end of session.  Therapy Documentation Precautions:  Precautions Precautions: Back, Fall Precaution Comments: reviewed back precautions; RLE buckles at times Required Braces or Orthoses: Spinal Brace Spinal Brace: Lumbar corset, Applied  in sitting position Restrictions Weight Bearing Restrictions: No    Therapy/Group: Individual Therapy   Peter Congo, PT, DPT  12/24/2019, 12:11 PM

## 2019-12-24 NOTE — Progress Notes (Signed)
[place] PHYSICAL MEDICINE & REHABILITATION PROGRESS NOTE   Subjective/Complaints: Feels that his left leg numbness improves with movement. Right side continues to be weak and numb. He had a good visit with his friend yesterday. Hgb down to 7.4. Iron level is very low-10  ROS: Denies constipation. +pain, insomnia  Objective:   No results found. Recent Labs    12/23/19 0554 12/24/19 0550  WBC 9.3 8.6  HGB 7.8* 7.4*  HCT 22.7* 22.0*  PLT 162 213   Recent Labs    12/23/19 0554  NA 135  K 3.9  CL 99  CO2 28  GLUCOSE 94  BUN 6  CREATININE 0.87  CALCIUM 8.7*    Intake/Output Summary (Last 24 hours) at 12/24/2019 0900 Last data filed at 12/24/2019 0800 Gross per 24 hour  Intake 530 ml  Output 1320 ml  Net -790 ml     Physical Exam: Vital Signs Blood pressure 124/80, pulse 85, temperature 98.4 F (36.9 C), resp. rate 16, height 6\' 1"  (1.854 m), weight 58.3 kg, SpO2 100 %.  General: Alert and oriented x 3, No apparent distress HEENT: Head is normocephalic, atraumatic, PERRLA, EOMI, sclera anicteric, oral mucosa pink and moist, dentition intact, ext ear canals clear,  Neck: Supple without JVD or lymphadenopathy Heart: Reg rate and rhythm. No murmurs rubs or gallops Chest: CTA bilaterally without wheezes, rales, or rhonchi; no distress Abdomen: Soft, non-tender, non-distended, bowel sounds positive. Extremities: No clubbing, cyanosis, or edema. Pulses are 2+ Skin: Contusion present on head. Spinal incision with honeycomb dressing. IV in left arm Neuro: Speech clear. Able to follow commands without difficulty. Significant back pain with attempts at RLE flexion/extenion.  RLE 0/5 strength on HF and KE, 3/5 DF and PF, LLE and upper extremities with 5/5 strength Psych: Pt's affect is appropriate. Pt is cooperative   Assessment/Plan: 1. Functional deficits secondary to cauda equina syndrome which require 3+ hours per day of interdisciplinary therapy in a comprehensive  inpatient rehab setting.  Physiatrist is providing close team supervision and 24 hour management of active medical problems listed below.  Physiatrist and rehab team continue to assess barriers to discharge/monitor patient progress toward functional and medical goals  Care Tool:  Bathing  Bathing activity did not occur: Refused Body parts bathed by patient: Right arm, Left arm, Chest, Abdomen, Front perineal area, Right upper leg, Left upper leg, Face         Bathing assist Assist Level: Moderate Assistance - Patient 50 - 74%     Upper Body Dressing/Undressing Upper body dressing Upper body dressing/undressing activity did not occur (including orthotics): Refused What is the patient wearing?: Pull over shirt, Orthosis    Upper body assist Assist Level: Moderate Assistance - Patient 50 - 74%    Lower Body Dressing/Undressing Lower body dressing    Lower body dressing activity did not occur: Refused What is the patient wearing?: Incontinence brief, Pants     Lower body assist Assist for lower body dressing: Maximal Assistance - Patient 25 - 49%     Toileting Toileting    Toileting assist Assist for toileting: Moderate Assistance - Patient 50 - 74% Assistive Device Comment:  (urinal)   Transfers Chair/bed transfer  Transfers assist     Chair/bed transfer assist level: Minimal Assistance - Patient > 75%     Locomotion Ambulation   Ambulation assist      Assist level: Minimal Assistance - Patient > 75% Assistive device: Walker-rolling Max distance: 150'   Walk 10 feet  activity   Assist     Assist level: Minimal Assistance - Patient > 75% Assistive device: Walker-rolling   Walk 50 feet activity   Assist    Assist level: Minimal Assistance - Patient > 75% Assistive device: Walker-rolling    Walk 150 feet activity   Assist    Assist level: Minimal Assistance - Patient > 75% Assistive device: Walker-rolling    Walk 10 feet on uneven  surface  activity   Assist Walk 10 feet on uneven surfaces activity did not occur: Safety/medical concerns         Wheelchair     Assist Will patient use wheelchair at discharge?: No             Wheelchair 50 feet with 2 turns activity    Assist            Wheelchair 150 feet activity     Assist          Blood pressure 124/80, pulse 85, temperature 98.4 F (36.9 C), resp. rate 16, height 6\' 1"  (1.854 m), weight 58.3 kg, SpO2 100 %.  Medical Problem List and Plan: 1.  Impaired mobility and ADLs secondary to cauda equina syndrome             -patient may shower but incision must be covered.              -ELOS/Goals: 7-8 days modI  -Continue CIR 2.  Antithrombotics: -DVT/anticoagulation:  Mechanical: Sequential compression devices, below knee Bilateral lower extremities Order dopplers for am- not done yet             -antiplatelet therapy: N/A 3. Pain Management:  Oxycodone and flexeril prn. Add Oxycontin 10 mg bid for consistent pain relief--still looks in quite a bit of pain. Will add gabapentin tid for neuropathy also. Add kpad. Well controlled 4. Mood: LCSW to follow for evaluation and support.              -antipsychotic agents: N/A 5. Neuropsych: This patient is capable of making decisions on his own behalf. 6. Skin/Wound Care: Spinal incision with honeycomb dressing.  May d/c IV-- order placed 7. Fluids/Electrolytes/Nutrition: Monitor I/O. Add protein supplement for low calorie malnutrition. K+ 3.7 on 8/2.  Normal on 8/4  8. Leucocytosis: Likely reactive--17.1-->12.8. Normal on 8/4 9. ABLA: Hgb 9.2 on 8/2. 7.4 on 8/5. Iron level 10 on 8/5- started iron supplement 10. Thrombocytopenia: Progressive--202-->112.  11. Hyponatremia/Hyperglycemia: Na 134 on 8/2. Na 134 today  12. Constipation: Schedule Miralax daily in am --effective.  Had good BM 8/5 13. HTN: Monitor BP TID -- elevated likely due to poorly controlled pain.  14. Hypomagnesemia: Mg  level improved to 2.2 on 8/5    LOS: 2 days A FACE TO FACE EVALUATION WAS PERFORMED  Fariha Goto P Caterin Tabares 12/24/2019, 9:00 AM

## 2019-12-25 ENCOUNTER — Inpatient Hospital Stay (HOSPITAL_COMMUNITY): Payer: Self-pay | Admitting: Physical Therapy

## 2019-12-25 ENCOUNTER — Inpatient Hospital Stay (HOSPITAL_COMMUNITY): Payer: Self-pay | Admitting: Occupational Therapy

## 2019-12-25 ENCOUNTER — Inpatient Hospital Stay (HOSPITAL_COMMUNITY): Payer: Self-pay

## 2019-12-25 MED ORDER — BACLOFEN 5 MG HALF TABLET: 5.0000 mg | ORAL_TABLET | Freq: Three times a day (TID) | ORAL | Status: DC | PRN

## 2019-12-25 NOTE — Progress Notes (Signed)
Occupational Therapy Session Note  Patient Details  Name: Colton Nichols MRN: [id number] Date of Birth: 1966-04-25  Today's Date: 12/25/2019 OT Individual Time: 4098-1191 OT Individual Time Calculation (min): 45 min    Short Term Goals: Week 1:  OT Short Term Goal 1 (Week 1): Pt will transfer to toilet wiht S overall OT Short Term Goal 2 (Week 1): Pt will complete 3/3 toileting components with CGA OT Short Term Goal 3 (Week 1): Pt will recall 3/3 back precautions OT Short Term Goal 4 (Week 1): Pt will footwear wiht AE PRN at S level  Skilled Therapeutic Interventions/Progress Updates:    Pt in bed to start session with reports of pain at 6/10 with meds given about 30 mins prior to this session.  He was educated on back precautions prior to moving to the EOB.  Min assist needed for supine to sit on the left side with use of the bed rails for support.  He then donned his LSO with min instructional cueing and supervision.  Sit to stand was completed at min assist with min instructional cueing for hand placement and to not pull up with both hands on the RW.  He stood at the sink with min guard assist for balance.  Next, had him transfer to the wheelchair and therapist took him down to the gym.  Min assist for transfer to the therapy mat where he was educated on use of the reacher and the sockaide for doffing and donning his gripper socks.  Finished session with transfer back to the wheelchair and back to the bed for resting at min assist level for all aspects.  Call button and phone in reach with safety alarm in place.    Therapy Documentation Precautions:  Precautions Precautions: Back, Fall Precaution Comments: reviewed back precautions Required Braces or Orthoses: Spinal Brace Spinal Brace: Lumbar corset, Applied in sitting position Restrictions Weight Bearing Restrictions: No  Pain: Pain Assessment Pain Scale: 0-10 Pain Score: 6  Pain Type: Surgical pain Pain Location: Back Pain  Orientation: Lower Pain Descriptors / Indicators: Discomfort Pain Onset: With Activity Pain Intervention(s): Repositioned ADL: See Care Tool Section for some details of mobility and selfcare  Therapy/Group: Individual Therapy  Naiomi Musto OTR/L 12/25/2019, 3:58 PM

## 2019-12-25 NOTE — Progress Notes (Signed)
Physical Therapy Note  Patient Details  Name: Colton Nichols MRN: [id number] Date of Birth: [birth year] Today's Date: 12/25/2019    Attempted to see patient for scheduled therapy session, pt declines participation in session this AM due to not getting any sleep overnight, ongoing body aches and headache. Per pt his RN and MD have been notified of symptoms. Pt missed 60 min of scheduled therapy session due to not feeling will. Will follow up per POC.    Peter Congo, PT, DPT  12/25/2019, 9:55 AM

## 2019-12-25 NOTE — Progress Notes (Signed)
[place] PHYSICAL MEDICINE & REHABILITATION PROGRESS NOTE   Subjective/Complaints: Feels full body soreness today. Has heating pad under buttock and the cords of it are hurting his right buttock- he is having hard time adjusting himself off of it.  Slept poorly at night due to his pain.   ROS: Denies constipation. +pain, insomnia  Objective:   No results found. Recent Labs    12/23/19 0554 12/24/19 0550  WBC 9.3 8.6  HGB 7.8* 7.4*  HCT 22.7* 22.0*  PLT 162 213   Recent Labs    12/23/19 0554  NA 135  K 3.9  CL 99  CO2 28  GLUCOSE 94  BUN 6  CREATININE 0.87  CALCIUM 8.7*    Intake/Output Summary (Last 24 hours) at 12/25/2019 0936 Last data filed at 12/25/2019 0811 Gross per 24 hour  Intake 360 ml  Output 675 ml  Net -315 ml     Physical Exam: Vital Signs Blood pressure 128/84, pulse 85, temperature 98.3 F (36.8 C), resp. rate 18, height 6\' 1"  (1.854 m), weight 58.7 kg, SpO2 98 %. General: Alert and oriented x 3, in pain today "all over" HEENT: Head is normocephalic, atraumatic, PERRLA, EOMI, sclera anicteric, oral mucosa pink and moist, dentition intact, ext ear canals clear,  Neck: Supple without JVD or lymphadenopathy Heart: Reg rate and rhythm. No murmurs rubs or gallops Chest: CTA bilaterally without wheezes, rales, or rhonchi; no distress Abdomen: Soft, non-tender, non-distended, bowel sounds positive. Extremities: No clubbing, cyanosis, or edema. Pulses are 2+ Skin: Contusion present on head. Spinal incision with honeycomb dressing. IV in left arm Neuro: Speech clear. Able to follow commands without difficulty. Significant back pain with attempts at RLE flexion/extenion.  RLE 0/5 strength on HF and KE, 3/5 DF and PF, LLE and upper extremities with 5/5 strength Psych: Pt's affect is appropriate. Pt is cooperative    Assessment/Plan: 1. Functional deficits secondary to cauda equina syndrome which require 3+ hours per day of interdisciplinary therapy in a  comprehensive inpatient rehab setting.  Physiatrist is providing close team supervision and 24 hour management of active medical problems listed below.  Physiatrist and rehab team continue to assess barriers to discharge/monitor patient progress toward functional and medical goals  Care Tool:  Bathing  Bathing activity did not occur: Refused Body parts bathed by patient: Right arm, Left arm, Chest, Abdomen, Front perineal area, Right upper leg, Left upper leg, Face         Bathing assist Assist Level: Moderate Assistance - Patient 50 - 74%     Upper Body Dressing/Undressing Upper body dressing Upper body dressing/undressing activity did not occur (including orthotics): Refused What is the patient wearing?: Pull over shirt, Orthosis    Upper body assist Assist Level: Moderate Assistance - Patient 50 - 74%    Lower Body Dressing/Undressing Lower body dressing    Lower body dressing activity did not occur: Refused What is the patient wearing?: Incontinence brief, Pants     Lower body assist Assist for lower body dressing: Maximal Assistance - Patient 25 - 49%     Toileting Toileting    Toileting assist Assist for toileting: Moderate Assistance - Patient 50 - 74% Assistive Device Comment:  (urinal)   Transfers Chair/bed transfer  Transfers assist     Chair/bed transfer assist level: Minimal Assistance - Patient > 75%     Locomotion Ambulation   Ambulation assist      Assist level: Minimal Assistance - Patient > 75% Assistive device: Walker-rolling Max  distance: 150'   Walk 10 feet activity   Assist     Assist level: Minimal Assistance - Patient > 75% Assistive device: Walker-rolling   Walk 50 feet activity   Assist    Assist level: Minimal Assistance - Patient > 75% Assistive device: Walker-rolling    Walk 150 feet activity   Assist    Assist level: Minimal Assistance - Patient > 75% Assistive device: Walker-rolling    Walk 10 feet  on uneven surface  activity   Assist Walk 10 feet on uneven surfaces activity did not occur: Safety/medical concerns         Wheelchair     Assist Will patient use wheelchair at discharge?: No             Wheelchair 50 feet with 2 turns activity    Assist            Wheelchair 150 feet activity     Assist          Blood pressure 128/84, pulse 85, temperature 98.3 F (36.8 C), resp. rate 18, height 6\' 1"  (1.854 m), weight 58.7 kg, SpO2 98 %.  Medical Problem List and Plan: 1.  Impaired mobility and ADLs secondary to cauda equina syndrome             -patient may shower but incision must be covered.              -ELOS/Goals: 7-8 days modI  -Continue CIR 2.  Antithrombotics: -DVT/anticoagulation:  Mechanical: Sequential compression devices, below knee Bilateral lower extremities Order dopplers for am- not done yet             -antiplatelet therapy: N/A 3. Pain Management:  Oxycodone and flexeril prn. Add Oxycontin 10 mg bid for consistent pain relief--still looks in quite a bit of pain. Will add gabapentin tid for neuropathy also. Add kpad. Add baclofen 8/6 for muscle spasms. Advised regarding possible sedating effects.  4. Mood: LCSW to follow for evaluation and support.              -antipsychotic agents: N/A 5. Neuropsych: This patient is capable of making decisions on his own behalf. 6. Skin/Wound Care: Spinal incision with honeycomb dressing. IV d/ced.  7. Fluids/Electrolytes/Nutrition: Monitor I/O. Add protein supplement for low calorie malnutrition. K+ 3.7 on 8/2.  Normal on 8/4  8. Leucocytosis: Likely reactive--17.1-->12.8. Normal on 8/4 9. ABLA: Hgb 9.2 on 8/2. 7.4 on 8/5. Iron level 10 on 8/5- started iron supplement 10. Thrombocytopenia: Progressive--202-->112.  11. Hyponatremia/Hyperglycemia: Na 134 on 8/2. Na 134 today  12. Constipation: Schedule Miralax daily in am --effective.  Had good BM 8/5 13. HTN: Monitor BP TID -- very well  controlled.  14. Hypomagnesemia: Mg level improved to 2.2 on 8/5    LOS: 3 days A FACE TO FACE EVALUATION WAS PERFORMED  10/5 Lajuanda Penick 12/25/2019, 9:36 AM

## 2019-12-25 NOTE — Progress Notes (Signed)
Physical Therapy Session Note  Patient Details  Name: JASHON ISHIDA MRN: [id number] Date of Birth: 12-31-1965  Today's Date: 12/25/2019 PT Individual Time: 1430-1530 PT Individual Time Calculation (min): 60 min   Short Term Goals: Week 1:  PT Short Term Goal 1 (Week 1): Pt will complete least restrictive transfer with CGA consistently PT Short Term Goal 2 (Week 1): Pt will ambulate x 150 ft with LRAD and CGA PT Short Term Goal 3 (Week 1): Pt will navigate 6" stairs with min A  Skilled Therapeutic Interventions/Progress Updates:    Pt received seated in bed, agreeable to therapy session. Pt reports soreness in low back and R hip but overall improved from AM. Pt does request pain medication at end of session, RN notified. Bed mobility CGA. Sit to stand with min A to RW throughout session. Pt exhibits improved ability to utilize one UE on bed/arm of chair and one UE on RW with sit to stand transfers this date. Took pt outdoors for improved mood and participation. Ambulation 2 x 150 ft with use of RW and min A across uneven ground, up/down incline, and navigating functional environment with obstacles. Pt requires min cueing for safe RW management and safety awareness. Standing mini-squats 2 x 10 reps with RW and min A for balance with mod manual cueing for correct exercise performance for LE strengthening. Side-steps L/R 2 x 10 ft with RW and min A for hip abd strengthening. Pt requests to return to bed at end of session. Sit to supine mod A for BLE management due to fatigue. Pt left semi-reclined in bed with needs in reach at end of session. Pt exhibits improved tolerance for therapy participation this PM.  Therapy Documentation Precautions:  Precautions Precautions: Back, Fall Precaution Comments: reviewed back precautions; RLE buckles at times Required Braces or Orthoses: Spinal Brace Spinal Brace: Lumbar corset, Applied in sitting position Restrictions Weight Bearing Restrictions:  No   Therapy/Group: Individual Therapy   Peter Congo, PT, DPT  12/25/2019, 3:47 PM

## 2019-12-25 NOTE — Progress Notes (Signed)
Occupational Therapy Note  Patient Details  Name: Colton Nichols MRN: [id number] Date of Birth: 08-Dec-1965  Today's Date: 12/25/2019 OT Missed Time: 60 Minutes Missed Time Reason: Pain  Pt resting in bed upon arrival.  Pt stated he didn't sleep well during the night and had continuous nightmares.  Pt also stated his "whole" body ached this morning.  Pt declined therapy, stating that "reality is setting in and he needs to get his mind around that." Pt missed 60 mins skilled OT services.   Lavone Neri [facility] 12/25/2019, 11:31 AM

## 2019-12-25 NOTE — IPOC Note (Signed)
Overall Plan of Care [facility]) Patient Details Name: Colton Nichols MRN: [id number] DOB: 12-10-1965  Admitting Diagnosis: Cauda equina syndrome [facility])  Hospital Problems: Principal Problem:   Cauda equina syndrome (HCC)     Functional Problem List: Nursing Bladder, Safety, Skin Integrity, Medication Management, Pain  PT Balance, Endurance, Motor, Pain, Safety, Sensory  OT Balance, Cognition, Endurance, Motor, Pain, Safety, Sensory  SLP    TR         Basic ADL's: OT Grooming, Bathing, Dressing, Toileting     Advanced  ADL's: OT Simple Meal Preparation, Laundry     Transfers: PT Bed Mobility, Bed to Chair, Car, Furniture, Civil Service fast streamer, Research scientist (life sciences): PT Ambulation, Psychologist, prison and probation services, Stairs     Additional Impairments: OT None  SLP        TR      Anticipated Outcomes Item Anticipated Outcome  Self Feeding MOD I  Swallowing      Basic self-care  MOD I  Toileting  MOD I   Bathroom Transfers MOD I  Bowel/Bladder  mod I  Transfers  mod I  Locomotion  mod I with LRAD  Communication     Cognition     Pain  pain </= 5  Safety/Judgment  mod I   Therapy Plan: PT Intensity: Minimum of 1-2 x/day ,45 to 90 minutes PT Frequency: 5 out of 7 days PT Duration Estimated Length of Stay: 10-14 days OT Intensity: Minimum of 1-2 x/day, 45 to 90 minutes OT Frequency: 5 out of 7 days OT Duration/Estimated Length of Stay: 10-14     Due to the current state of emergency, patients may not be receiving their 3-hours of Medicare-mandated therapy.   Team Interventions: Nursing Interventions Pain Management, Bladder Management, Medication Management, Patient/Family Education, Skin Care/Wound Management  PT interventions Ambulation/gait training, Warden/ranger, Community reintegration, Discharge planning, Disease management/prevention, DME/adaptive equipment instruction, Functional mobility training, Neuromuscular re-education, Pain  management, Patient/family education, Psychosocial support, Splinting/orthotics, Stair training, Therapeutic Activities, Therapeutic Exercise, UE/LE Strength taining/ROM, UE/LE Coordination activities  OT Interventions Balance/vestibular training, Discharge planning, Pain management, Self Care/advanced ADL retraining, Therapeutic Activities, UE/LE Coordination activities, Therapeutic Exercise, Skin care/wound managment, Patient/family education, Functional mobility training, Disease mangement/prevention, Community reintegration, Fish farm manager, Neuromuscular re-education, Psychosocial support, Splinting/orthotics, UE/LE Strength taining/ROM, Wheelchair propulsion/positioning  SLP Interventions    TR Interventions    SW/CM Interventions     Barriers to Discharge MD  Medical stability and Lack of/limited family support  Nursing Wound Care, Medication compliance, Decreased caregiver support, Home environment access/layout    PT Decreased caregiver support, Home environment access/layout    OT Decreased caregiver support, Lack of/limited family support, Home environment Best boy, Inaccessible home environment    SLP      SW       Team Discharge Planning: Destination: PT-Home ,OT- Home , SLP-  Projected Follow-up: PT-Outpatient PT, OT-  Home health OT, SLP-  Projected Equipment Needs: PT-Rolling walker with 5" wheels, OT- 3 in 1 bedside comode, Tub/shower seat, To be determined, SLP-  Equipment Details: PT-further equipment TBD pending progress, OT-mother may have all equipment Patient/family involved in discharge planning: PT- Patient,  OT-Patient, SLP-   MD ELOS: 7-8 days modI Medical Rehab Prognosis:  Excellent Assessment: Colton Nichols is a 54 year old man who was admitted to CIR with impaired mobility and ADLssecondary to cauda equina syndrome. He is motivated and has been progressing well. His therapy has been limited by pain. He has diffuse soreness throughout  his body, incisional pain, and right sided thoracic and lumbar paraspinal muscle spasm. His pain is being managed with Kpad, oxycodone prn, flexeril prn, and baclofen prn.  His hemoglobin is low and we are monitoring regular bloodwork. Iron was also checked and was very low. Started on a supplement.   See Team Conference Notes for weekly updates to the plan of care

## 2019-12-26 MED ORDER — DOCUSATE SODIUM 100 MG PO CAPS
100.0000 mg | ORAL_CAPSULE | Freq: Every day | ORAL | Status: DC
  Administered 2019-12-26 – 2019-12-28 (×3): 100 mg via ORAL
  Filled 2019-12-26 (×3): qty 1

## 2019-12-26 MED ORDER — GABAPENTIN 100 MG PO CAPS
200.0000 mg | ORAL_CAPSULE | Freq: Three times a day (TID) | ORAL | Status: DC
  Administered 2019-12-26 – 2019-12-28 (×7): 200 mg via ORAL
  Filled 2019-12-26 (×7): qty 2

## 2019-12-26 NOTE — Progress Notes (Signed)
[place] PHYSICAL MEDICINE & REHABILITATION PROGRESS NOTE   Subjective/Complaints:   RLE burning pain last noc, relief with meds  Voiding well, + hard stools    ROS: Denies constipation. +pain, insomnia  Objective:   No results found. Recent Labs    12/24/19 0550  WBC 8.6  HGB 7.4*  HCT 22.0*  PLT 213   No results for input(s): NA, K, CL, CO2, GLUCOSE, BUN, CREATININE, CALCIUM in the last 72 hours.  Intake/Output Summary (Last 24 hours) at 12/26/2019 0729 Last data filed at 12/26/2019 0438 Gross per 24 hour  Intake 642 ml  Output 810 ml  Net -168 ml     Physical Exam: Vital Signs Blood pressure 125/70, pulse 82, temperature 98.3 F (36.8 C), resp. rate 18, height 6\' 1"  (1.854 m), weight 59.1 kg, SpO2 100 %.  General: No acute distress Mood and affect are appropriate Heart: Regular rate and rhythm no rubs murmurs or extra sounds Lungs: Clear to auscultation, breathing unlabored, no rales or wheezes Abdomen: Positive bowel sounds, soft nontender to palpation, nondistended Extremities: No clubbing, cyanosis, or edema Skin: No evidence of breakdown, no evidence of rash  Neuro: Speech clear. Able to follow commands without difficulty. Significant back pain with attempts at RLE flexion/extenion.  RLE 2-/5 strength on HF and KE, 3/5 DF and PF, LLE and upper extremities with 5/5 strength Psych: Pt's affect is appropriate. Pt is cooperative    Assessment/Plan: 1. Functional deficits secondary to cauda equina syndrome which require 3+ hours per day of interdisciplinary therapy in a comprehensive inpatient rehab setting.  Physiatrist is providing close team supervision and 24 hour management of active medical problems listed below.  Physiatrist and rehab team continue to assess barriers to discharge/monitor patient progress toward functional and medical goals  Care Tool:  Bathing  Bathing activity did not occur: Refused Body parts bathed by patient: Right arm, Left  arm, Chest, Abdomen, Front perineal area, Right upper leg, Left upper leg, Face         Bathing assist Assist Level: Moderate Assistance - Patient 50 - 74%     Upper Body Dressing/Undressing Upper body dressing Upper body dressing/undressing activity did not occur (including orthotics): Refused What is the patient wearing?: Orthosis    Upper body assist Assist Level: Supervision/Verbal cueing    Lower Body Dressing/Undressing Lower body dressing    Lower body dressing activity did not occur: Refused What is the patient wearing?: Incontinence brief, Pants     Lower body assist Assist for lower body dressing: Maximal Assistance - Patient 25 - 49%     Toileting Toileting    Toileting assist Assist for toileting: Moderate Assistance - Patient 50 - 74% Assistive Device Comment:  (urinal)   Transfers Chair/bed transfer  Transfers assist     Chair/bed transfer assist level: Minimal Assistance - Patient > 75%     Locomotion Ambulation   Ambulation assist      Assist level: Minimal Assistance - Patient > 75% Assistive device: Walker-rolling Max distance: 150'   Walk 10 feet activity   Assist     Assist level: Minimal Assistance - Patient > 75% Assistive device: Walker-rolling   Walk 50 feet activity   Assist    Assist level: Minimal Assistance - Patient > 75% Assistive device: Walker-rolling    Walk 150 feet activity   Assist    Assist level: Minimal Assistance - Patient > 75% Assistive device: Walker-rolling    Walk 10 feet on uneven surface  activity  Assist Walk 10 feet on uneven surfaces activity did not occur: Safety/medical concerns         Wheelchair     Assist Will patient use wheelchair at discharge?: No             Wheelchair 50 feet with 2 turns activity    Assist            Wheelchair 150 feet activity     Assist          Blood pressure 125/70, pulse 82, temperature 98.3 F (36.8 C), resp.  rate 18, height 6\' 1"  (1.854 m), weight 59.1 kg, SpO2 100 %.  Medical Problem List and Plan: 1.  Impaired mobility and ADLs secondary to cauda equina syndrome             -patient may shower but incision must be covered.              -ELOS/Goals: 7-8 days modI  -Continue CIR PT, OT    2.  Antithrombotics: -DVT/anticoagulation:  Mechanical: Sequential compression devices, below knee Bilateral lower extremities Order dopplers for am- not done yet             -antiplatelet therapy: N/A 3. Pain Management:  Oxycodone and flexeril prn. Add Oxycontin 10 mg bid for consistent pain relief--still looks in quite a bit of pain. Will add gabapentin tid for neuropathy also. Add kpad. Add baclofen 8/6 for muscle spasms. Advised regarding possible sedating effects.  4. Mood: LCSW to follow for evaluation and support.              -antipsychotic agents: N/A 5. Neuropsych: This patient is capable of making decisions on his own behalf. 6. Skin/Wound Care: Spinal incision with honeycomb dressing. IV d/ced.  7. Fluids/Electrolytes/Nutrition: Monitor I/O. Add protein supplement for low calorie malnutrition. K+ 3.7 on 8/2.  Normal on 8/4  8. Leucocytosis: Likely reactive--17.1-->12.8. Normal on 8/4 9. ABLA: Hgb 9.2 on 8/2. 7.4 on 8/5. Iron level 10 on 8/5- started iron supplement . 10. Thrombocytopenia: Progressive--202-->112.  11. Hyponatremia/Hyperglycemia: Na 134 on 8/2. Na 134 today  12. Constipation: Schedule Miralax daily in am --effective.  Had good BM 8/5 13. HTN: Monitor BP TID -- very well controlled.  Vitals:   12/25/19 1959 12/26/19 0438  BP:  125/70  Pulse: 92 82  Resp:  18  Temp:  98.3 F (36.8 C)  SpO2:  100%   14. Hypomagnesemia: Mg level improved to 2.2 on 8/5    LOS: 4 days A FACE TO FACE EVALUATION WAS PERFORMED  02/25/20 12/26/2019, 7:29 AM

## 2019-12-27 ENCOUNTER — Encounter (HOSPITAL_COMMUNITY): Payer: Self-pay | Admitting: Occupational Therapy

## 2019-12-27 ENCOUNTER — Inpatient Hospital Stay (HOSPITAL_COMMUNITY): Payer: Self-pay | Admitting: Physical Therapy

## 2019-12-27 ENCOUNTER — Inpatient Hospital Stay (HOSPITAL_COMMUNITY): Payer: Self-pay

## 2019-12-27 LAB — GLUCOSE, CAPILLARY: Glucose-Capillary: 93 mg/dL (ref 70–99)

## 2019-12-27 NOTE — Progress Notes (Signed)
Occupational Therapy Session Note  Patient Details  Name: Colton Nichols MRN: [id number] Date of Birth: 07-Sep-1965  Today's Date: 12/27/2019 OT Missed Time: 30 Minutes Missed Time Reason: Patient fatigue  Pt reporting he "over did it" at dance group this AM. Requesting to rest and start back therapy in the morning. 30 min skilled OT missed.    Crissie Reese 12/27/2019, 7:31 AM

## 2019-12-27 NOTE — ED Provider Notes (Signed)
[facility]EMERGENCY DEPT [facility] Emergency Department Provider Note MRN:  [id number]  Arrival date & time: 12/27/19     Chief Complaint   Fall   History of Present Illness   Colton Nichols is a 54 y.o. year-old male with no pertinent past medical history presenting to the ED with chief complaint of fall.  Patient lost balance and fell onto his buttocks, experienced immediate loss of bowel control and had a bowel movement, experienced severe pain to the lower back with decreased feeling in the right leg.  Review of Systems  A complete 10 system review of systems was obtained and all systems are negative except as noted in the HPI and PMH.   Patient's Health History   History reviewed. No pertinent past medical history.  Past Surgical History:  Procedure Laterality Date  . LACERATION REPAIR    . POSTERIOR LUMBAR FUSION 4 LEVEL N/A 12/19/2019   Procedure: Lumbar Three Decompressive Laminectomy with Transpedicular Decompression, Lumbar one - Lumbar five Posterior Lateral Arthrodesis utilizing segmental pedicle screw fixation and local autografting.;  Surgeon: Julio Sicks, MD;  Location: [facility] OR;  Service: Neurosurgery;  Laterality: N/A;    Family History  Adopted: Yes    Social History   Socioeconomic History  . Marital status: Single    Spouse name: Not on file  . Number of children: Not on file  . Years of education: Not on file  . Highest education level: Not on file  Occupational History  . Not on file  Tobacco Use  . Smoking status: Current Every Day Smoker    Packs/day: 0.50    Types: Cigarettes  . Smokeless tobacco: Never Used  Substance and Sexual Activity  . Alcohol use: Yes    Comment: drinks a couple of beers daily  . Drug use: No  . Sexual activity: Not on file  Other Topics Concern  . Not on file  Social History Narrative  . Not on file   Social Determinants of Health   Financial Resource Strain:   . Difficulty of Paying Living Expenses:   Food  Insecurity:   . Worried About Programme researcher, broadcasting/film/video in the Last Year:   . Barista in the Last Year:   Transportation Needs:   . Freight forwarder (Medical):   Marland Kitchen Lack of Transportation (Non-Medical):   Physical Activity:   . Days of Exercise per Week:   . Minutes of Exercise per Session:   Stress:   . Feeling of Stress :   Social Connections:   . Frequency of Communication with Friends and Family:   . Frequency of Social Gatherings with Friends and Family:   . Attends Religious Services:   . Active Member of Clubs or Organizations:   . Attends Banker Meetings:   Marland Kitchen Marital Status:   Intimate Partner Violence:   . Fear of Current or Ex-Partner:   . Emotionally Abused:   Marland Kitchen Physically Abused:   . Sexually Abused:      Physical Exam   Vitals:   12/22/19 1200 12/22/19 1517  BP: (!) 141/100   Pulse:    Resp:    Temp:  98.8 F (37.1 C)  SpO2:      CONSTITUTIONAL: Well-appearing, NAD NEURO:  Alert and oriented x 3, decreased sensation to the right leg EYES:  eyes equal and reactive ENT/NECK:  no LAD, no JVD CARDIO: Regular rate, well-perfused, normal S1 and S2 PULM:  CTAB no wheezing or rhonchi GI/GU:  normal bowel sounds, non-distended, non-tender MSK/SPINE:  No gross deformities, no edema SKIN:  no rash, atraumatic PSYCH:  Appropriate speech and behavior  *Additional and/or pertinent findings included in MDM below  Diagnostic and Interventional Summary    EKG Interpretation  Date/Time:  Saturday December 19 2019 07:40:42 EDT Ventricular Rate:  80 PR Interval:    QRS Duration: 84 QT Interval:  381 QTC Calculation: 440 R Axis:   84 Text Interpretation: Sinus arrhythmia Anteroseptal infarct, age indeterminate Confirmed by Geoffery Lyons ([id number]) on 12/20/2019 11:43:28 PM      Labs Reviewed  CBC - Abnormal; Notable for the following components:      Result Value   WBC 17.1 (*)    RBC 3.88 (*)    MCV 102.6 (*)    MCH 35.6 (*)    All other  components within normal limits  BASIC METABOLIC PANEL - Abnormal; Notable for the following components:   CO2 18 (*)    Glucose, Bld 114 (*)    All other components within normal limits  CBC - Abnormal; Notable for the following components:   WBC 13.9 (*)    RBC 2.78 (*)    Hemoglobin 9.6 (*)    HCT 28.7 (*)    MCV 103.2 (*)    MCH 34.5 (*)    Platelets 117 (*)    All other components within normal limits  BASIC METABOLIC PANEL - Abnormal; Notable for the following components:   Glucose, Bld 142 (*)    Calcium 8.4 (*)    All other components within normal limits  CBC - Abnormal; Notable for the following components:   WBC 12.8 (*)    RBC 2.64 (*)    Hemoglobin 9.2 (*)    HCT 27.2 (*)    MCV 103.0 (*)    MCH 34.8 (*)    Platelets 112 (*)    All other components within normal limits  BASIC METABOLIC PANEL - Abnormal; Notable for the following components:   Sodium 134 (*)    Glucose, Bld 161 (*)    Calcium 8.7 (*)    All other components within normal limits  SARS CORONAVIRUS 2 BY RT PCR (HOSPITAL ORDER, PERFORMED IN [facility])  MRSA PCR SCREENING  PROTIME-INR  MAGNESIUM  TYPE AND SCREEN  ABO/RH    DG C-Arm 1-60 Min  Final Result    DG Lumbar Spine 2-3 Views  Final Result    DG Femur Min 2 Views Right  Final Result    CT Lumbar Spine Wo Contrast  Final Result    CT PELVIS WO CONTRAST  Final Result      Medications  ceFAZolin (ANCEF) 2-4 GM/100ML-% IVPB (has no administration in time range)  oxyCODONE (Oxy IR/ROXICODONE) 5 MG immediate release tablet (has no administration in time range)  promethazine (PHENERGAN) 25 MG/ML injection (has no administration in time range)  HYDROmorphone (DILAUDID) 1 MG/ML injection (has no administration in time range)  HYDROmorphone (DILAUDID) injection 1 mg (1 mg Intravenous Given 12/19/19 0759)  HYDROmorphone (DILAUDID) injection 1 mg (1 mg Intravenous Given 12/19/19 0903)  HYDROmorphone (DILAUDID) injection 1  mg (1 mg Intravenous Given 12/19/19 1034)  ceFAZolin (ANCEF) IVPB 1 g/50 mL premix ( Intravenous Stopped 12/20/19 0427)  HYDROmorphone (DILAUDID) 1 MG/ML injection (1 mg  Given 12/19/19 1557)  bisacodyl (DULCOLAX) EC tablet 10 mg (10 mg Oral Given 12/21/19 1726)     Procedures  /  Critical Care .Critical Care Performed by: Sabas Sous,  MD Authorized by: Sabas Sous, MD   Critical care provider statement:    Critical care time (minutes):  37   Critical care was necessary to treat or prevent imminent or life-threatening deterioration of the following conditions: Lumbar spinal injury with concern for spinal cord injury.   Critical care was time spent personally by me on the following activities:  Discussions with consultants, evaluation of patient's response to treatment, examination of patient, ordering and performing treatments and interventions, ordering and review of laboratory studies, ordering and review of radiographic studies, pulse oximetry, re-evaluation of patient's condition, obtaining history from patient or surrogate and review of old charts    ED Course and Medical Decision Making  I have reviewed the triage vital signs, the nursing notes, and pertinent available records from the EMR.  Listed above are laboratory and imaging tests that I personally ordered, reviewed, and interpreted and then considered in my medical decision making (see below for details).      Fall, severe back pain with decreased sensation of the right leg and question of loss of bowel control, CT imaging revealing burst fracture of L3 with compromise of the spinal canal, neurosurgery consulted, transferred to [facility] for further care.   Elmer Sow. Pilar Plate, MD [facility] Emergency Medicine [facility] [email or web address]  Final Clinical Impressions(s) / ED Diagnoses     ICD-10-CM   1. Closed unstable burst fracture of third lumbar vertebra, initial encounter (HCC)  S32.032A   2.  Surgery, elective  Z41.9 DG Lumbar Spine 2-3 Views    DG Lumbar Spine 2-3 Views    Discharge Instructions Discussed with and Provided to Patient:     Sabas Sous, MD 12/27/19 2033

## 2019-12-27 NOTE — Progress Notes (Signed)
Occupational Therapy Session Note  Patient Details  Name: Colton Nichols MRN: [id number] Date of Birth: [birth year]  Today's Date: 12/27/2019 OT Group Time: 1100-1200 OT Group Time Calculation (min): 60 min  Skilled Therapeutic Interventions/Progress Updates:    Pt engaged in therapeutic w/c level dance group focusing on patient choice, UE/LE strengthening, salience, activity tolerance, and social participation. Pt was guided through various dance-based exercises involving UEs/LEs and trunk. All music was selected by group members. Emphasis placed on general strengthening and activity tolerance while adhering to back precautions. Pt participated well in group and took minimal rest breaks, did well with maintaining precautions as well. He declined standing though was visibly enjoying seated dance. At end of session pt was escorted back to room by RN.    Therapy Documentation Precautions:  Precautions Precautions: Back, Fall Precaution Comments: reviewed back precautions Required Braces or Orthoses: Spinal Brace Spinal Brace: Lumbar corset, Applied in sitting position Restrictions Weight Bearing Restrictions: No Pain: no s/s pain during tx   ADL: ADL Grooming: Setup Where Assessed-Grooming: Sitting at sink Lower Body Dressing: Supervision/safety (footwear only) Where Assessed-Lower Body Dressing: Wheelchair Toileting: Moderate assistance (simulated) Where Assessed-Toileting: Bedside Commode Toilet Transfer: Minimal assistance Toilet Transfer Equipment: Bedside commode, Grab bars (RW) Walk-In Shower Transfer: Insurance underwriter Method: Stand pivot, Designer, industrial/product: Shower seat with back     Therapy/Group: Group Therapy  [place] Semrad A Amany Rando 12/27/2019, 12:31 PM

## 2019-12-27 NOTE — Progress Notes (Signed)
[place] PHYSICAL MEDICINE & REHABILITATION PROGRESS NOTE   Subjective/Complaints:    Getting up to toilet with nsg assist.  No pain c/os, still feels numb in Right LE   ROS: Denies constipation. +pain, insomnia  Objective:   No results found. No results for input(s): WBC, HGB, HCT, PLT in the last 72 hours. No results for input(s): NA, K, CL, CO2, GLUCOSE, BUN, CREATININE, CALCIUM in the last 72 hours.  Intake/Output Summary (Last 24 hours) at 12/27/2019 0631 Last data filed at 12/27/2019 0355 Gross per 24 hour  Intake 820 ml  Output 1950 ml  Net -1130 ml     Physical Exam: Vital Signs Blood pressure 135/87, pulse 78, temperature 98.4 F (36.9 C), temperature source Oral, resp. rate 18, height 6\' 1"  (1.854 m), weight 59.1 kg, SpO2 100 %.  General: No acute distress Mood and affect are appropriate Heart: Regular rate and rhythm no rubs murmurs or extra sounds Lungs: Clear to auscultation, breathing unlabored, no rales or wheezes Abdomen: Positive bowel sounds, soft nontender to palpation, nondistended Extremities: No clubbing, cyanosis, or edema Skin: No evidence of breakdown, no evidence of rash Sensory able to distinguish LT in both feet Neuro: Speech clear. Able to follow commands without difficulty. Significant back pain with attempts at RLE flexion/extenion.  RLE 2-/5 strength on HF and KE, 3/5 DF and PF, LLE and upper extremities with 5/5 strength Psych: Pt's affect is appropriate. Pt is cooperative    Assessment/Plan: 1. Functional deficits secondary to cauda equina syndrome which require 3+ hours per day of interdisciplinary therapy in a comprehensive inpatient rehab setting.  Physiatrist is providing close team supervision and 24 hour management of active medical problems listed below.  Physiatrist and rehab team continue to assess barriers to discharge/monitor patient progress toward functional and medical goals  Care Tool:  Bathing  Bathing activity did  not occur: Refused Body parts bathed by patient: Right arm, Left arm, Chest, Abdomen, Front perineal area, Right upper leg, Left upper leg, Face         Bathing assist Assist Level: Moderate Assistance - Patient 50 - 74%     Upper Body Dressing/Undressing Upper body dressing Upper body dressing/undressing activity did not occur (including orthotics): Refused What is the patient wearing?: Orthosis    Upper body assist Assist Level: Supervision/Verbal cueing    Lower Body Dressing/Undressing Lower body dressing    Lower body dressing activity did not occur: Refused What is the patient wearing?: Incontinence brief, Pants     Lower body assist Assist for lower body dressing: Maximal Assistance - Patient 25 - 49%     Toileting Toileting    Toileting assist Assist for toileting: Moderate Assistance - Patient 50 - 74% Assistive Device Comment:  (urinal)   Transfers Chair/bed transfer  Transfers assist     Chair/bed transfer assist level: Minimal Assistance - Patient > 75%     Locomotion Ambulation   Ambulation assist      Assist level: Minimal Assistance - Patient > 75% Assistive device: Walker-rolling Max distance: 150'   Walk 10 feet activity   Assist     Assist level: Minimal Assistance - Patient > 75% Assistive device: Walker-rolling   Walk 50 feet activity   Assist    Assist level: Minimal Assistance - Patient > 75% Assistive device: Walker-rolling    Walk 150 feet activity   Assist    Assist level: Minimal Assistance - Patient > 75% Assistive device: Walker-rolling    Walk 10 feet  on uneven surface  activity   Assist Walk 10 feet on uneven surfaces activity did not occur: Safety/medical concerns         Wheelchair     Assist Will patient use wheelchair at discharge?: No             Wheelchair 50 feet with 2 turns activity    Assist            Wheelchair 150 feet activity     Assist           Blood pressure 135/87, pulse 78, temperature 98.4 F (36.9 C), temperature source Oral, resp. rate 18, height 6\' 1"  (1.854 m), weight 59.1 kg, SpO2 100 %.  Medical Problem List and Plan: 1.  Impaired mobility and ADLs secondary to cauda equina syndrome             -patient may shower but incision must be covered.              -ELOS/Goals: 7-8 days modI  -Continue CIR PT, OT    2.  Antithrombotics: -DVT/anticoagulation:  Mechanical: Sequential compression devices, below knee Bilateral lower extremities Order dopplers for am- not done yet             -antiplatelet therapy: N/A 3. Pain Management:  Oxycodone and flexeril prn. Add Oxycontin 10 mg bid for consistent pain relief--still looks in quite a bit of pain. Will add gabapentin tid for neuropathy also. Add kpad. Add baclofen 8/6 for muscle spasms. 4. Mood: LCSW to follow for evaluation and support.              -antipsychotic agents: N/A 5. Neuropsych: This patient is capable of making decisions on his own behalf. 6. Skin/Wound Care: Spinal incision with honeycomb dressing. IV d/ced.  7. Fluids/Electrolytes/Nutrition: Monitor I/O. Add protein supplement for low calorie malnutrition. K+ 3.7 on 8/2.  Normal on 8/4  8. Leucocytosis: Likely reactive--17.1-->12.8. Normal on 8/4 9. ABLA: Hgb 9.2 on 8/2. 7.4 on 8/5. Iron level 10 on 8/5- started iron supplement . 10. Thrombocytopenia: Progressive--202-->112. No heparin or enoxaparin, recheck CBC in am  11. Hyponatremia improved nl on 8/4 12. Constipation: Schedule Miralax daily in am --effective.  Had good BM 8/5 13. HTN: Monitor BP TID -- very well controlled.  Vitals:   12/26/19 1937 12/27/19 0355  BP: (!) 142/76 135/87  Pulse: (!) 103 78  Resp: 18 18  Temp: 98.9 F (37.2 C) 98.4 F (36.9 C)  SpO2: 100% 100%  controlled 8/8 14. Hypomagnesemia:resolved  Mg level improved to 2.2 on 8/5    LOS: 5 days A FACE TO FACE EVALUATION WAS PERFORMED  02/26/20 12/27/2019, 6:31 AM

## 2019-12-27 NOTE — Progress Notes (Signed)
Physical Therapy Session Note  Patient Details  Name: Colton Nichols MRN: [id number] Date of Birth: [birth year]  Today's Date: 12/27/2019 PT Individual Time: 8502-7741 and 2878-6767 PT Individual Time Calculation (min): 56 min and 30 min.  Short Term Goals: Week 1:  PT Short Term Goal 1 (Week 1): Pt will complete least restrictive transfer with CGA consistently PT Short Term Goal 2 (Week 1): Pt will ambulate x 150 ft with LRAD and CGA PT Short Term Goal 3 (Week 1): Pt will navigate 6" stairs with min A  Skilled Therapeutic Interventions/Progress Updates:   First session:  Pt presents left side-lying in bed asleep.  Pt states wasn't feeling well earlier and just went back to sleep.  Pt agreeable to therapy.  Pt transferred to sitting EOB w/ min to CGA and use of rails.  Pt sat EOB and donned LS corset w/ set-up.  Required total A to don shoes.  Pt wheeled outside for time and energy conservation.  Pt educated on hand placement for sit <> stand transfers.  Pt transfers w/ 1 hand on w/c, 1 hand on RW.  Pt amb 100' and 200' on unlevel surface outside, verbal cueing for reciprocal gait pattern and maintaining position in RW.  Pt required rest between trials.  Pt performed standing partial squats 3 x 10, abd w/ use of back of chair outside.  Pt returned to floor and handed off to OT for treatment.  Secind session:  Pt presents reclined in recliner but agreeable to therapy.  Pt transfers multiple surfaces w/ min A, 1 hand on surface, 1 hand on RW.  Pt amb multiple trials up to 200' w/ min to CGA, reciprocal gait pattern.  Pt negotiated 8 steps x 2 of 3" steps, using B rails and min A.  Pt states that 2 steps to mothers home are closer to 3" steps w/ 2 rails.  Pt returned to room and doffed LS corset w/ supervision and performed log roll to left side-lying w/ min A for LEs, and then rolls supervision to supine.  Bed alarm on and all needs in reach.       Therapy Documentation Precautions:   Precautions Precautions: Back, Fall Precaution Comments: reviewed back precautions Required Braces or Orthoses: Spinal Brace Spinal Brace: Lumbar corset, Applied in sitting position Restrictions Weight Bearing Restrictions: No General:   Vital Signs:   Pain: 5/10 to LB w/ activity. PM: 2/10 pain to dorsum of right foot.  Nursing to give pain meds. Pain Assessment Pain Scale: 0-10 Pain Score: 0-No pain    Therapy/Group: Individual Therapy  Lucio Edward 12/27/2019, 11:02 AM

## 2019-12-27 NOTE — Plan of Care (Signed)
  Problem: SCI BOWEL ELIMINATION Goal: RH STG MANAGE BOWEL WITH ASSISTANCE Description: STG Manage Bowel with mod I Assistance. Outcome: Progressing   Problem: SCI BLADDER ELIMINATION Goal: RH STG MANAGE BLADDER WITH ASSISTANCE Description: STG Manage Bladder With mod I Assistance Outcome: Progressing   Problem: RH SKIN INTEGRITY Goal: RH STG SKIN FREE OF INFECTION/BREAKDOWN Description: Remain free of infection/further breakdown during rehab stay  Outcome: Progressing Goal: RH STG MAINTAIN SKIN INTEGRITY WITH ASSISTANCE Description: STG Maintain Skin Integrity With min Assistance. Outcome: Progressing   Problem: RH SAFETY Goal: RH STG ADHERE TO SAFETY PRECAUTIONS W/ASSISTANCE/DEVICE Description: STG Adhere to Safety Precautions With min Assistance/Device. Outcome: Progressing Goal: RH STG DECREASED RISK OF FALL WITH ASSISTANCE Description: STG Decreased Risk of Fall With min Assistance. Outcome: Progressing   Problem: RH PAIN MANAGEMENT Goal: RH STG PAIN MANAGED AT OR BELOW PT'S PAIN GOAL Description: Pain </= 4 Outcome: Progressing   Problem: RH KNOWLEDGE DEFICIT SCI Goal: RH STG INCREASE KNOWLEDGE OF SELF CARE AFTER SCI Outcome: Progressing   Problem: Consults Goal: RH SPINAL CORD INJURY PATIENT EDUCATION Description:  See Patient Education module for education specifics.  Outcome: Progressing   

## 2019-12-28 ENCOUNTER — Inpatient Hospital Stay (HOSPITAL_COMMUNITY): Payer: Self-pay

## 2019-12-28 ENCOUNTER — Inpatient Hospital Stay (HOSPITAL_COMMUNITY): Payer: Self-pay | Admitting: Physical Therapy

## 2019-12-28 LAB — BASIC METABOLIC PANEL
Anion gap: 10 (ref 5–15)
BUN: 11 mg/dL (ref 6–20)
CO2: 26 mmol/L (ref 22–32)
Calcium: 9.3 mg/dL (ref 8.9–10.3)
Chloride: 100 mmol/L (ref 98–111)
Creatinine, Ser: 0.92 mg/dL (ref 0.61–1.24)
GFR calc Af Amer: >60 mL/min (ref >60)
GFR calc non Af Amer: >60 mL/min (ref >60)
Glucose, Bld: 100 mg/dL — ABNORMAL HIGH (ref 70–99)
Potassium: 4.3 mmol/L (ref 3.5–5.1)
Sodium: 136 mmol/L (ref 135–145)

## 2019-12-28 LAB — CBC
HCT: 23.7 % — ABNORMAL LOW (ref 39.0–52.0)
Hemoglobin: 7.8 g/dL — ABNORMAL LOW (ref 13.0–17.0)
MCH: 33.9 pg (ref 26.0–34.0)
MCHC: 32.9 g/dL (ref 30.0–36.0)
MCV: 103 fL — ABNORMAL HIGH (ref 80.0–100.0)
Platelets: 513 10*3/uL — ABNORMAL HIGH (ref 150–400)
RBC: 2.3 MIL/uL — ABNORMAL LOW (ref 4.22–5.81)
RDW: 13.1 % (ref 11.5–15.5)
WBC: 8.4 10*3/uL (ref 4.0–10.5)
nRBC: 0 % (ref 0.0–0.2)

## 2019-12-28 LAB — URINALYSIS, ROUTINE W REFLEX MICROSCOPIC
Bacteria, UA: NONE SEEN
Bilirubin Urine: NEGATIVE
Glucose, UA: NEGATIVE mg/dL
Ketones, ur: NEGATIVE mg/dL
Leukocytes,Ua: NEGATIVE
Nitrite: NEGATIVE
Protein, ur: NEGATIVE mg/dL
Specific Gravity, Urine: 1.016 (ref 1.005–1.030)
pH: 8 (ref 5.0–8.0)

## 2019-12-28 MED ORDER — GABAPENTIN 300 MG PO CAPS
300.0000 mg | ORAL_CAPSULE | Freq: Three times a day (TID) | ORAL | Status: DC
  Administered 2019-12-28 – 2020-01-07 (×30): 300 mg via ORAL
  Filled 2019-12-28 (×30): qty 1

## 2019-12-28 MED ORDER — GABAPENTIN 100 MG PO CAPS
100.0000 mg | ORAL_CAPSULE | Freq: Once | ORAL | Status: AC
  Administered 2019-12-28: 100 mg via ORAL
  Filled 2019-12-28: qty 1

## 2019-12-28 MED ORDER — SENNOSIDES-DOCUSATE SODIUM 8.6-50 MG PO TABS
2.0000 | ORAL_TABLET | Freq: Every day | ORAL | Status: DC
  Administered 2019-12-28 – 2020-01-07 (×11): 2 via ORAL
  Filled 2019-12-28 (×11): qty 2

## 2019-12-28 MED ORDER — SENNA 8.6 MG PO TABS: 1.0000 | ORAL_TABLET | Freq: Every day | ORAL | Status: DC

## 2019-12-28 MED ORDER — SORBITOL 70 % SOLN: 30.0000 mL | Freq: Every day | Status: DC | PRN

## 2019-12-28 MED ORDER — SENNOSIDES-DOCUSATE SODIUM 8.6-50 MG PO TABS: 1.0000 | ORAL_TABLET | Freq: Every day | ORAL | Status: DC

## 2019-12-28 NOTE — Progress Notes (Signed)
Occupational Therapy Note  Patient Details  Name: MARIN MILLEY MRN: [id number] Date of Birth: 03-Feb-1966  Today's Date: 12/28/2019 OT Missed Time: 75 Minutes Missed Time Reason: Patient fatigue  Attempted to see pt for scheduled OT session. Pt resting in bed upon arrival.  Pt stated he awoke in morning and his "whole body" was hurting.  Pt stated he still doesn't feel "right" and declined OT services at this time. Pt missed 75 mins skilled OT services.   Lavone Neri [facility] 12/28/2019, 10:20 AM

## 2019-12-28 NOTE — Progress Notes (Signed)
Physical Therapy Session Note  Patient Details  Name: Colton Nichols MRN: [id number] Date of Birth: 12/04/1965  Today's Date: 12/28/2019 PT Individual Time: 1320-1400 PT Individual Time Calculation (min): 40 min  PT Amount of Missed Time (min): 20 Minutes PT Missed Treatment Reason: Unavailable (Comment) (eating lunch)  Short Term Goals: Week 1:  PT Short Term Goal 1 (Week 1): Pt will complete least restrictive transfer with CGA consistently PT Short Term Goal 2 (Week 1): Pt will ambulate x 150 ft with LRAD and CGA PT Short Term Goal 3 (Week 1): Pt will navigate 6" stairs with min A  Skilled Therapeutic Interventions/Progress Updates:  Pt received seated EOB having just started his lunch. This therapist returned after 20 min and pt done eating lunch and agreeable to participate in therapy session. Pt reports improvement in symptoms he was experiencing this AM with mild soreness in his low back, not rated. Pt is min A to stand to RW throughout session. Ambulation 2 x 200 ft with RW and CGA. Pt exhibits improved upright posture and step through gait pattern during ambulation. Pt does report pain in dorsum of R foot, sensitive to light touch. Pt describes pain as "burning" but also "numb". Education with patient about nerve pain likely cause of his pain and will address with the team. Pt also reports onset of R hip pain with stand to sit transition, decreases at rest. Pt declines intervention for pain at this time. Standing BLE strengthening exercises: marches x 10 reps B, heel raises x 15 reps with use of RW and min A for balance. Pt requests to return to bed at end of session, min A for bed mobility. Pt left semi-reclined in bed with needs in reach, bed alarm in place at end of session. Pt missed 20 min of therapy session at beginning due to having just received lunch.  Therapy Documentation Precautions:  Precautions Precautions: Back, Fall Precaution Comments: reviewed back  precautions Required Braces or Orthoses: Spinal Brace Spinal Brace: Lumbar corset, Applied in sitting position Restrictions Weight Bearing Restrictions: No   Therapy/Group: Individual Therapy   Peter Congo, PT, DPT  12/28/2019, 3:58 PM

## 2019-12-28 NOTE — Progress Notes (Signed)
Physical Therapy Note  Patient Details  Name: Colton Nichols MRN: [id number] Date of Birth: 06/27/1965 Today's Date: 12/28/2019  PT Amount of Missed Time (min): 60 Minutes PT Missed Treatment Reason: Patient fatigue;Patient ill (Comment) (chills, body aches)   Attempted to see patient for scheduled therapy session. Pt reports not sleeping well overnight and reports chills/body aches throughout the night and ongoing pain and soreness this AM. MD and RN in room to assess pt during session. MD to order UA. Pt declines any participation in therapy this AM due to feelings of malaise. Encouraged pt to transfer to recliner to sit up for breakfast, pt declines and wishes to rest in bed at this time. Pt left supine in bed with needs in reach. Pt missed 60 min of scheduled therapy session due to fatigue and illness. Will follow up per POC.   Peter Congo, PT, DPT  12/28/2019, 8:36 AM

## 2019-12-28 NOTE — Progress Notes (Signed)
Feels better this afternoon and did some therapy which made him feel good. Back incision C/D with honeycomb dressing and dry drainage. Non tender but hypersensitive. Reports that he felt cold inside all night--now his body feels like its throbbing--appears neuropathic in nature. Will increase gabapentin. Also discussed importance of bowel regimen --on narcotics and  refused Senna. He is eating well and reports that PTA, used to have BM 3X day past meals. Will change senna and colace to  2 Senna S daily and monitor.  He is not in favor of suppository or enema.

## 2019-12-28 NOTE — Plan of Care (Signed)
  Problem: SCI BOWEL ELIMINATION Goal: RH STG MANAGE BOWEL WITH ASSISTANCE Description: STG Manage Bowel with mod I Assistance. Outcome: Progressing   Problem: SCI BLADDER ELIMINATION Goal: RH STG MANAGE BLADDER WITH ASSISTANCE Description: STG Manage Bladder With mod I Assistance Outcome: Progressing   Problem: RH SKIN INTEGRITY Goal: RH STG SKIN FREE OF INFECTION/BREAKDOWN Description: Remain free of infection/further breakdown during rehab stay  Outcome: Progressing

## 2019-12-28 NOTE — Progress Notes (Signed)
[facility] PHYSICAL MEDICINE & REHABILITATION PROGRESS NOTE   Subjective/Complaints:   Pt reports feels miserable this AM- feels cold "from the inside" which is new- "hurts to breathe"- per PA, felt better this afternoon after some therapy.   Also says pain is "throbbing"- denies burning- LBM 2 days ago- throbbing HA.   Appears like had infection- ordered U/A and Cx- U/A is negative.  WBC is normal Added Senna- PA changed senna to 2 senokot daily- also ordered sorbitol today prn. Refusing suppository and enema.   ROS:  Pt denies SOB, abd pain, CP, N/V/C/D, and vision changes   Objective:   No results found. Recent Labs    12/28/19 0535  WBC 8.4  HGB 7.8*  HCT 23.7*  PLT 513*   Recent Labs    12/28/19 0535  NA 136  K 4.3  CL 100  CO2 26  GLUCOSE 100*  BUN 11  CREATININE 0.92  CALCIUM 9.3    Intake/Output Summary (Last 24 hours) at 12/28/2019 1915 Last data filed at 12/28/2019 1835 Gross per 24 hour  Intake 480 ml  Output 1470 ml  Net -990 ml     Physical Exam: Vital Signs Blood pressure (!) 116/103, pulse 91, temperature 98.7 F (37.1 C), temperature source Oral, resp. rate 17, height 6\' 1"  (1.854 m), weight 59.1 kg, SpO2 100 %.  General: laying underneath many covers- appears near tears, frustrated/in pain, NAD Mood and affect are frustrated Heart: RRR Lungs: CTA B/L- no W/R/R- good air movement Abdomen: soft, NT, ND, (+)hypoactive Extremities: No clubbing, cyanosis, or edema Skin: No evidence of breakdown, no evidence of rash Sensory able to distinguish LT in both feet Neuro: Speech clear. Able to follow commands without difficulty. Significant back pain with attempts at RLE flexion/extenion.  RLE 2-/5 strength on HF and KE, 3/5 DF and PF, LLE and upper extremities with 5/5 strength Psych: Pt's affect is frusttrated    Assessment/Plan: 1. Functional deficits secondary to cauda equina syndrome which require 3+ hours per day of interdisciplinary  therapy in a comprehensive inpatient rehab setting.  Physiatrist is providing close team supervision and 24 hour management of active medical problems listed below.  Physiatrist and rehab team continue to assess barriers to discharge/monitor patient progress toward functional and medical goals  Care Tool:  Bathing  Bathing activity did not occur: Refused Body parts bathed by patient: Right arm, Left arm, Chest, Abdomen, Front perineal area, Right upper leg, Left upper leg, Face         Bathing assist Assist Level: Moderate Assistance - Patient 50 - 74%     Upper Body Dressing/Undressing Upper body dressing Upper body dressing/undressing activity did not occur (including orthotics): Refused What is the patient wearing?: Orthosis    Upper body assist Assist Level: Supervision/Verbal cueing    Lower Body Dressing/Undressing Lower body dressing    Lower body dressing activity did not occur: Refused What is the patient wearing?: Incontinence brief, Pants     Lower body assist Assist for lower body dressing: Maximal Assistance - Patient 25 - 49%     Toileting Toileting    Toileting assist Assist for toileting: Moderate Assistance - Patient 50 - 74% Assistive Device Comment:  (urinal)   Transfers Chair/bed transfer  Transfers assist     Chair/bed transfer assist level: Contact Guard/Touching assist     Locomotion Ambulation   Ambulation assist      Assist level: Contact Guard/Touching assist Assistive device: Walker-rolling Max distance: 200   Walk  10 feet activity   Assist     Assist level: Contact Guard/Touching assist Assistive device: Walker-rolling   Walk 50 feet activity   Assist    Assist level: Contact Guard/Touching assist Assistive device: Walker-rolling    Walk 150 feet activity   Assist    Assist level: Contact Guard/Touching assist Assistive device: Walker-rolling    Walk 10 feet on uneven surface  activity   Assist  Walk 10 feet on uneven surfaces activity did not occur: Safety/medical concerns   Assist level: Minimal Assistance - Patient > 75% Assistive device: Photographer Will patient use wheelchair at discharge?: No             Wheelchair 50 feet with 2 turns activity    Assist            Wheelchair 150 feet activity     Assist          Blood pressure (!) 116/103, pulse 91, temperature 98.7 F (37.1 C), temperature source Oral, resp. rate 17, height 6\' 1"  (1.854 m), weight 59.1 kg, SpO2 100 %.  Medical Problem List and Plan: 1.  Impaired mobility and ADLs secondary to cauda equina syndrome             -patient may shower but incision must be covered.              -ELOS/Goals: 7-8 days modI  -Continue CIR PT, OT     2.  Antithrombotics: -DVT/anticoagulation:  Mechanical: Sequential compression devices, below knee Bilateral lower extremities Order dopplers for am- not done yet  8/9- pt at high risk for DVT- will check into dopplers and Lovenox- will call NSU             -antiplatelet therapy: N/A 3. Pain Management:  Oxycodone and flexeril prn. Add Oxycontin 10 mg bid for consistent pain relief--still looks in quite a bit of pain. Will add gabapentin tid for neuropathy also. Add kpad. Add baclofen 8/6 for muscle spasms.   8/9- c/o "overdoing it"- will see how does today and maybe make changes tomorrow 4. Mood: LCSW to follow for evaluation and support.              -antipsychotic agents: N/A 5. Neuropsych: This patient is capable of making decisions on his own behalf. 6. Skin/Wound Care: Spinal incision with honeycomb dressing. IV d/ced.  7. Fluids/Electrolytes/Nutrition: Monitor I/O. Add protein supplement for low calorie malnutrition. K+ 3.7 on 8/2.  Normal on 8/4  8. Leucocytosis: Likely reactive--17.1-->12.8. Normal on 8/4 9. ABLA: Hgb 9.2 on 8/2. 7.4 on 8/5. Iron level 10 on 8/5- started iron supplement . 8/9- Hb 7.8- back up  slightly 10. Thrombocytopenia: Progressive--202-->112. No heparin or enoxaparin, recheck CBC in am  11. Hyponatremia improved nl on 8/4  8/9- Na 136- resolved 12. Constipation: Schedule Miralax daily in am --effective.  Had good BM 8/5 13. HTN: Monitor BP TID -- very well controlled.  Vitals:   12/28/19 0444 12/28/19 1339  BP: 127/86 (!) 116/103  Pulse: 78 91  Resp: 16 17  Temp: 98.4 F (36.9 C) 98.7 F (37.1 C)  SpO2: 99% 100%  controlled 8/8 14. Hypomagnesemia:resolved  Mg level improved to 2.2 on 8/5  15. "freezing"  8/9- maybe overdid it- WBC nml- U/A (-)- doing better in afternoon- will monitor   LOS: 6 days A FACE TO FACE EVALUATION WAS PERFORMED  Chong Wojdyla 12/28/2019, 7:15 PM

## 2019-12-29 ENCOUNTER — Inpatient Hospital Stay (HOSPITAL_COMMUNITY): Payer: Self-pay

## 2019-12-29 ENCOUNTER — Inpatient Hospital Stay (HOSPITAL_COMMUNITY): Payer: Self-pay | Admitting: Physical Therapy

## 2019-12-29 LAB — CBC WITH DIFFERENTIAL/PLATELET
Abs Immature Granulocytes: 0.1 10*3/uL — ABNORMAL HIGH (ref 0.00–0.07)
Basophils Absolute: 0.2 10*3/uL — ABNORMAL HIGH (ref 0.0–0.1)
Basophils Relative: 2 %
Eosinophils Absolute: 0.2 10*3/uL (ref 0.0–0.5)
Eosinophils Relative: 2 %
HCT: 25.3 % — ABNORMAL LOW (ref 39.0–52.0)
Hemoglobin: 8.2 g/dL — ABNORMAL LOW (ref 13.0–17.0)
Lymphocytes Relative: 26 %
Lymphs Abs: 2.2 10*3/uL (ref 0.7–4.0)
MCH: 33.3 pg (ref 26.0–34.0)
MCHC: 32.4 g/dL (ref 30.0–36.0)
MCV: 102.8 fL — ABNORMAL HIGH (ref 80.0–100.0)
Monocytes Absolute: 0.8 10*3/uL (ref 0.1–1.0)
Monocytes Relative: 10 %
Myelocytes: 1 %
Neutro Abs: 4.9 10*3/uL (ref 1.7–7.7)
Neutrophils Relative %: 59 %
Platelets: 545 10*3/uL — ABNORMAL HIGH (ref 150–400)
RBC: 2.46 MIL/uL — ABNORMAL LOW (ref 4.22–5.81)
RDW: 13.1 % (ref 11.5–15.5)
WBC: 8.3 10*3/uL (ref 4.0–10.5)
nRBC: 0 % (ref 0.0–0.2)
nRBC: 0 /100 WBC

## 2019-12-29 LAB — URINE CULTURE: Culture: <10000 — AB

## 2019-12-29 MED ORDER — WHITE PETROLATUM EX OINT
TOPICAL_OINTMENT | CUTANEOUS | Status: AC
  Filled 2019-12-29: qty 28.35

## 2019-12-29 MED ORDER — LIDOCAINE 5 % EX PTCH
1.0000 | MEDICATED_PATCH | CUTANEOUS | Status: DC
  Administered 2019-12-29 – 2020-01-07 (×10): 1 via TRANSDERMAL
  Filled 2019-12-29 (×12): qty 1

## 2019-12-29 NOTE — Progress Notes (Signed)
Physical Therapy Session Note  Patient Details  Name: Colton Nichols MRN: [id number] Date of Birth: 1965-08-23  Today's Date: 12/29/2019 PT Individual Time: 1450-1535 PT Individual Time Calculation (min): 45 min   Short Term Goals: Week 1:  PT Short Term Goal 1 (Week 1): Pt will complete least restrictive transfer with CGA consistently PT Short Term Goal 2 (Week 1): Pt will ambulate x 150 ft with LRAD and CGA PT Short Term Goal 3 (Week 1): Pt will navigate 6" stairs with min A Week 2:    Week 3:     Skilled Therapeutic Interventions/Progress Updates:    PAIN pt c/o pain on dorsum of R foot, care taken w/donning/doffing shoes, rest as needed. Pt initially supine and eager to participate in session. Supine to sit w/supervision Pt donns LSO w/set up assist only. Shoes donned by therapist w/pt I sitting balance during donning.  Pt provides particular/specific instruction to assist w/this. STS w/supervision, gait 157ft w/close supervision w/RW  Standing LE therex including: Facing stair w/bilat rails alt tapping 5in step 2x10 each Step ups w/UE support alt LEs x 10 each, decreased control RLE minisquats w/bilat UE support and cues for controlling ROM, first effort pt w/buckling R knee/max assist for recovery, then continued in more limited ROM x 25  Gait 42ft w/RW w/close supervision, transferred to mat w/close supervision.  Performed the following functional strengthening/hip/knee stabilization activity: W/orange TB resistance at knees, abd in sitting- STS maintaining abd, sidestep, return to sit maintaining abd Repeated length of mat moving to L, rested several min then repeated moving length of mat to R, cues to isolate quads/gluts w/STS by "driving thru heels".  Gait 149ft w/RW and close supervision, turn/sit to edge of bed w/supervision.  Pt removes shoes w/assist to untie, mod assist to remove.  Pt left sitting on edge of bed w/alarm set and NT in room/agreed to hand off of  pt.  Therapy Documentation Precautions:  Precautions Precautions: Back, Fall Precaution Comments: reviewed back precautions Required Braces or Orthoses: Spinal Brace Spinal Brace: Lumbar corset, Applied in sitting position Restrictions Weight Bearing Restrictions: No    Therapy/Group: Individual Therapy  Rada Hay, PT   Shearon Balo 12/29/2019, 4:00 PM

## 2019-12-29 NOTE — Progress Notes (Addendum)
Patient ID: Colton Nichols, male   DOB: 1965/10/24, 54 y.o.   MRN: [id number]   Voicemail left for Christia Reading ([phone]) to determine if patient is eligible for Medicaid.  SW providing Adult Disability information to patient

## 2019-12-29 NOTE — Patient Care Conference (Signed)
Inpatient RehabilitationTeam Conference and Plan of Care Update Date: 12/29/2019   Time: 11:06 AM    Patient Name: Colton Nichols      Medical Record Number: [id number]  Date of Birth: 03-15-1966 Sex: Male         Room/Bed: 4M09C/4M09C-01 Payor Info: Payor: /    Admit Date/Time:  12/22/2019  7:38 PM  Primary Diagnosis:  Cauda equina syndrome [facility])  Hospital Problems: Principal Problem:   Cauda equina syndrome [facility])    Expected Discharge Date: Expected Discharge Date: 01/07/20  Team Members Present: Physician leading conference: Dr. Genice Rouge Care Coodinator Present: Cecile Sheerer, LCSWA;Daemian Gahm Marlyne Beards, RN, BSN, CRRN Nurse Present: Doran Durand, LPN PT Present: Peter Congo, PT OT Present: Ardis Rowan, Elvera Maria, OT PPS Coordinator present : Edson Snowball, Park Breed, SLP     Current Status/Progress Goal Weekly Team Focus  Bowel/Bladder   Pt continent of B/B, LBM 8/9  Remian continent  Assess toileting q shift and prn   Swallow/Nutrition/ Hydration             ADL's   functional tranfsers/amb with RW-CGA; bathing-min A; LB dressing-min A; toileting-CGA  mod I overall  BADL retraining, activity tolerance, safety awareness, standing balance, education   Mobility   CGA to min A overall with RW, gait up to 200 ft with RW CGA, min A stairs  mod I with LRAD  LE strengthening, safety, balance, gait   Communication             Safety/Cognition/ Behavioral Observations            Pain   Pt c/o lower back pain, has scheduled and prn meds  Pain less than 5, encourage pt to call for prn pain meds  Assess pain q shift and prn   Skin   Surgical inscion on lower back w/ honeycomb dressing in place  Prevent further skin breakdown, monitor for s/s of infxn  Assess skin q shift and prn     Discharge Planning:  Patient to discharge home with mother. Mother to provide supervision assistance only.   Team Discussion: Continent B/B, Bladder empties, can dc  bladder scans, and PVR's. Encourage pt to ask for pain medication when pain first comes on. Contact guard to min assist with Mod I goals. Some min assist with lower body BD with adaptive equipment. Patient on target to meet rehab goals: yes  *See Care Plan and progress notes for long and short-term goals.   Revisions to Treatment Plan:  MD to assess for need for bowel program, Neuropsychology appt. Requested by therapy.   Teaching Needs: Family education as requested by therapy.  Current Barriers to Discharge: Home enviroment access/layout and Lack of/limited family support  Possible Resolutions to Barriers: Pt currently lives in boarding house, to be dc'd to mother's home. Continue working on transfers, gait training, and stairs. Mother will be able to provide limited assistance.     Medical Summary Current Status: continent B/B- BM this AM- no cathing- nerve pain- and post op pain-- started lidoderm patch for R foot  Barriers to Discharge: Decreased family/caregiver support;Home enviroment access/layout;Weight bearing restrictions;Weight;Other (comments)  Barriers to Discharge Comments: cauda equina syndrome- anxiety about d/c and SCI- a lot of pain in AM Possible Resolutions to [employer]: should be able to meet mod I goals- by d/c- using some equipment; Shower chair/tub transfer and RW   Continued Need for Acute Rehabilitation Level of Care: The patient requires daily medical management by a physician  with specialized training in physical medicine and rehabilitation for the following reasons: Direction of a multidisciplinary physical rehabilitation program to maximize functional independence : Yes Medical management of patient stability for increased activity during participation in an intensive rehabilitation regime.: Yes Analysis of laboratory values and/or radiology reports with any subsequent need for medication adjustment and/or medical intervention. : Yes   I attest  that I was present, lead the team conference, and concur with the assessment and plan of the team.   Tennis Must 12/29/2019, 12:39 PM

## 2019-12-29 NOTE — Progress Notes (Signed)
Patient ID: Colton Nichols, male   DOB: 03/20/1966, 54 y.o.   MRN: [id number]   SW met with pt in room to provide updates from team conference, and d/c date 8/19. Pt confirms d/c to his mother's home ([place], [place], [place]. Pt states his mother is currently out of town on vacation and would need to wait for her to return in order to get into the home. He intends to follow-up with his mother about when she will return. SW informed pt there will be updates once there is more information from financial counselors. SW discussed with pt charity for DME and HH therapies if needed. Pt states he will see if he has a RW, TTB, and BSC that his mother or grandmother may have available.   *Notes show pt was screened and qualified for 12 mos disability. SW left message for Shanon Rosser [phone]) to inform on pt d/c date and inquired if they will initiate Medicaid application/diasblity application for pt. SW waiting on follow-up.   Loralee Pacas, MSW, [place] Office: [phone] Cell: [phone] Fax: [phone]

## 2019-12-29 NOTE — Plan of Care (Signed)
  Problem: SCI BOWEL ELIMINATION Goal: RH STG MANAGE BOWEL WITH ASSISTANCE Description: STG Manage Bowel with mod I Assistance. Outcome: Progressing   Problem: SCI BLADDER ELIMINATION Goal: RH STG MANAGE BLADDER WITH ASSISTANCE Description: STG Manage Bladder With mod I Assistance Outcome: Progressing   Problem: RH SKIN INTEGRITY Goal: RH STG SKIN FREE OF INFECTION/BREAKDOWN Description: Remain free of infection/further breakdown during rehab stay  Outcome: Progressing Goal: RH STG MAINTAIN SKIN INTEGRITY WITH ASSISTANCE Description: STG Maintain Skin Integrity With min Assistance. Outcome: Progressing   Problem: RH SAFETY Goal: RH STG ADHERE TO SAFETY PRECAUTIONS W/ASSISTANCE/DEVICE Description: STG Adhere to Safety Precautions With min Assistance/Device. Outcome: Progressing Goal: RH STG DECREASED RISK OF FALL WITH ASSISTANCE Description: STG Decreased Risk of Fall With min Assistance. Outcome: Progressing   Problem: RH PAIN MANAGEMENT Goal: RH STG PAIN MANAGED AT OR BELOW PT'S PAIN GOAL Description: Pain </= 4 Outcome: Progressing   Problem: RH KNOWLEDGE DEFICIT SCI Goal: RH STG INCREASE KNOWLEDGE OF SELF CARE AFTER SCI Outcome: Progressing   Problem: Consults Goal: RH SPINAL CORD INJURY PATIENT EDUCATION Description:  See Patient Education module for education specifics.  Outcome: Progressing

## 2019-12-29 NOTE — Progress Notes (Signed)
Patient ID: Colton Nichols, male   DOB: 10-Dec-1965, 54 y.o.   MRN: [id number]   Patient screen by financial counselor, patient now working with [employer]. Patient qualified for 12 month disability.

## 2019-12-29 NOTE — Progress Notes (Signed)
[place] PHYSICAL MEDICINE & REHABILITATION PROGRESS NOTE   Subjective/Complaints:   Pt reports had BM last night after dinner- first BM in awhile, but was substantial, per pt.   Wants to have regular BMs- but refusing suppositories and enemas- unfortunately, Cauda equina pts usually need bowel program.    ROS:   Pt denies SOB, abd pain, CP, N/V/C/D, and vision changes   Objective:   No results found. Recent Labs    12/28/19 0535 12/29/19 0617  WBC 8.4 8.3  HGB 7.8* 8.2*  HCT 23.7* 25.3*  PLT 513* 545*   Recent Labs    12/28/19 0535  NA 136  K 4.3  CL 100  CO2 26  GLUCOSE 100*  BUN 11  CREATININE 0.92  CALCIUM 9.3    Intake/Output Summary (Last 24 hours) at 12/29/2019 0852 Last data filed at 12/29/2019 0501 Gross per 24 hour  Intake 240 ml  Output 1395 ml  Net -1155 ml     Physical Exam: Vital Signs Blood pressure 121/77, pulse 79, temperature 98.2 F (36.8 C), resp. rate 18, height 6\' 1"  (1.854 m), weight 57.8 kg, SpO2 100 %.  General: alert, supine in bed- more appropriate, NAD Mood and affect are less frustrated Heart: RRR Lungs: CTA B/L- no W/R/R- good air movement Abdomen: Soft, NT, ND, (+)BS  Extremities: No clubbing, cyanosis, or edema Skin: No evidence of breakdown, no evidence of rash Sensory able to distinguish LT in both feet- TTP over top of R foot- specifically on dorsum of foot Neuro: Speech clear. Able to follow commands without difficulty. Significant back pain with attempts at RLE flexion/extenion.  RLE 2-/5 strength on HF and KE, 3/5 DF and PF, LLE and upper extremities with 5/5 strength Psych: Pt's affect is less frusttrated    Assessment/Plan: 1. Functional deficits secondary to cauda equina syndrome which require 3+ hours per day of interdisciplinary therapy in a comprehensive inpatient rehab setting.  Physiatrist is providing close team supervision and 24 hour management of active medical problems listed  below.  Physiatrist and rehab team continue to assess barriers to discharge/monitor patient progress toward functional and medical goals  Care Tool:  Bathing  Bathing activity did not occur: Refused Body parts bathed by patient: Right arm, Left arm, Chest, Abdomen, Front perineal area, Right upper leg, Left upper leg, Face         Bathing assist Assist Level: Moderate Assistance - Patient 50 - 74%     Upper Body Dressing/Undressing Upper body dressing Upper body dressing/undressing activity did not occur (including orthotics): Refused What is the patient wearing?: Orthosis    Upper body assist Assist Level: Supervision/Verbal cueing    Lower Body Dressing/Undressing Lower body dressing    Lower body dressing activity did not occur: Refused What is the patient wearing?: Incontinence brief, Pants     Lower body assist Assist for lower body dressing: Maximal Assistance - Patient 25 - 49%     Toileting Toileting    Toileting assist Assist for toileting: Moderate Assistance - Patient 50 - 74% Assistive Device Comment:  (urinal)   Transfers Chair/bed transfer  Transfers assist     Chair/bed transfer assist level: Contact Guard/Touching assist     Locomotion Ambulation   Ambulation assist      Assist level: Contact Guard/Touching assist Assistive device: Walker-rolling Max distance: 200   Walk 10 feet activity   Assist     Assist level: Contact Guard/Touching assist Assistive device: Walker-rolling   Walk 50 feet activity  Assist    Assist level: Contact Guard/Touching assist Assistive device: Walker-rolling    Walk 150 feet activity   Assist    Assist level: Contact Guard/Touching assist Assistive device: Walker-rolling    Walk 10 feet on uneven surface  activity   Assist Walk 10 feet on uneven surfaces activity did not occur: Safety/medical concerns   Assist level: Minimal Assistance - Patient > 75% Assistive device:  Photographer Will patient use wheelchair at discharge?: No             Wheelchair 50 feet with 2 turns activity    Assist            Wheelchair 150 feet activity     Assist          Blood pressure 121/77, pulse 79, temperature 98.2 F (36.8 C), resp. rate 18, height 6\' 1"  (1.854 m), weight 57.8 kg, SpO2 100 %.  Medical Problem List and Plan: 1.  Impaired mobility and ADLs secondary to cauda equina syndrome             -patient may shower but incision must be covered.              -ELOS/Goals: 7-8 days modI  -Continue CIR PT, OT     2.  Antithrombotics: -DVT/anticoagulation:  Mechanical: Sequential compression devices, below knee Bilateral lower extremities Order dopplers for am- not done yet  8/9- pt at high risk for DVT- will check into dopplers and Lovenox- will call NSU             -antiplatelet therapy: N/A 3. Pain Management:  Oxycodone and flexeril prn. Add Oxycontin 10 mg bid for consistent pain relief--still looks in quite a bit of pain. Will add gabapentin tid for neuropathy also. Add kpad. Add baclofen 8/6 for muscle spasms.   8/9- c/o "overdoing it"- will see how does today and maybe make changes tomorrow  8/10- will add lidoderm patch on top of R foot 8am-8pm for nerve pain 4. Mood: LCSW to follow for evaluation and support.              -antipsychotic agents: N/A 5. Neuropsych: This patient is capable of making decisions on his own behalf. 6. Skin/Wound Care: Spinal incision with honeycomb dressing. IV d/ced.  7. Fluids/Electrolytes/Nutrition: Monitor I/O. Add protein supplement for low calorie malnutrition. K+ 3.7 on 8/2.  Normal on 8/4  8. Leucocytosis: Likely reactive--17.1-->12.8. Normal on 8/4 9. ABLA: Hgb 9.2 on 8/2. 7.4 on 8/5. Iron level 10 on 8/5- started iron supplement . 8/9- Hb 7.8- back up slightly 10. Thrombocytopenia: Progressive--202-->112. No heparin or enoxaparin, recheck CBC in am   8/10- Plts  545k 11. Hyponatremia improved nl on 8/4  8/9- Na 136- resolved 12. Constipation: Schedule Miralax daily in am --effective.  Had good BM 8/5 13. HTN: Monitor BP TID -- very well controlled.  Vitals:   12/28/19 1953 12/29/19 0628  BP: 132/84 121/77  Pulse: 87 79  Resp: 17 18  Temp: 98.3 F (36.8 C) 98.2 F (36.8 C)  SpO2: 100% 100%  8/10- BP controlled 14. Hypomagnesemia:resolved  Mg level improved to 2.2 on 8/5  15. "freezing"  8/9- maybe overdid it- WBC nml- U/A (-)- doing better in afternoon- will monitor  8/10- feeling better today   LOS: 7 days A FACE TO FACE EVALUATION WAS PERFORMED  Colton Nichols 12/29/2019, 8:52 AM

## 2019-12-29 NOTE — Progress Notes (Signed)
Occupational Therapy Session Note  Patient Details  Name: Colton Nichols MRN: [id number] Date of Birth: [birth year]  Today's Date: 12/29/2019 OT Individual Time: 1300-1345 OT Individual Time Calculation (min): 45 min    Short Term Goals: Week 1:  OT Short Term Goal 1 (Week 1): Pt will transfer to toilet wiht S overall OT Short Term Goal 2 (Week 1): Pt will complete 3/3 toileting components with CGA OT Short Term Goal 3 (Week 1): Pt will recall 3/3 back precautions OT Short Term Goal 4 (Week 1): Pt will footwear wiht AE PRN at S level  Skilled Therapeutic Interventions/Progress Updates:    OT intervention with focus on introduction of AE for donning socks and Ted Hose. Sock Aide demonstrated and pt return demonstrated X 3. Pt required assistance doffing sock on R foot. Adaptive technique for donning Ted hose demonstrated and pt able to don [facility] on L foot. Pt required assistance with R foot. Reviewed back precautions. LSO donned and pt performed sit<>stand X 5 from EOB without BUE support with CGA. Pt remained in bed with all needs wihtin reach and bed alarm activated.   Therapy Documentation Precautions:  Precautions Precautions: Back, Fall Precaution Comments: reviewed back precautions Required Braces or Orthoses: Spinal Brace Spinal Brace: Lumbar corset, Applied in sitting position Restrictions Weight Bearing Restrictions: No Pain:  Pt stated pain in R hip was much improved, as well as R foot   Therapy/Group: Individual Therapy  Rich Brave 12/29/2019, 2:54 PM

## 2019-12-29 NOTE — Progress Notes (Signed)
Physical Therapy Session Note  Patient Details  Name: Colton Nichols MRN: [id number] Date of Birth: [birth year]  Today's Date: 12/29/2019 PT Individual Time: 1638-4665 PT Individual Time Calculation (min): 45 min   Short Term Goals: Week 1:  PT Short Term Goal 1 (Week 1): Pt will complete least restrictive transfer with CGA consistently PT Short Term Goal 2 (Week 1): Pt will ambulate x 150 ft with LRAD and CGA PT Short Term Goal 3 (Week 1): Pt will navigate 6" stairs with min A  Skilled Therapeutic Interventions/Progress Updates:    Pt received seated EOB eating breakfast, agreeable to therapy session. Pt reports onset of sharp posterior R hip/low back pain with mobility that decreases at rest. Pain not rated. RN notified and able to provide pain medication at end of session, otherwise pain addressed via mobility. Pt is setup A for donning lumbar corset while seated EOB, dependent for donning shoes for time conservation. Sit to stand with min A to RW throughout session. Ambulation 2 x 150 ft with RW and CGA, antalgic gait pattern this date due to R hip pain and ongoing pain on dorsum of R foot with mobility. Sit to/from supine with min A for RLE management on flat mat table. Supine BLE strengthening therex x 10-15 reps to tolerance with AAROM needed for RLE: heel slides, hip abd. Pt reports urge to use the bathroom once back in room. Pt left seated on toilet in bathroom with call button in reach at end of session.  Therapy Documentation Precautions:  Precautions Precautions: Back, Fall Precaution Comments: reviewed back precautions Required Braces or Orthoses: Spinal Brace Spinal Brace: Lumbar corset, Applied in sitting position Restrictions Weight Bearing Restrictions: No    Therapy/Group: Individual Therapy   Peter Congo, PT, DPT  12/29/2019, 9:49 AM

## 2019-12-29 NOTE — Progress Notes (Signed)
Occupational Therapy Session Note  Patient Details  Name: Colton Nichols MRN: [id number] Date of Birth: 18-Feb-1966  Today's Date: 12/29/2019 OT Individual Time: 1000-1055 OT Individual Time Calculation (min): 55 min    Short Term Goals: Week 1:  OT Short Term Goal 1 (Week 1): Pt will transfer to toilet wiht S overall OT Short Term Goal 2 (Week 1): Pt will complete 3/3 toileting components with CGA OT Short Term Goal 3 (Week 1): Pt will recall 3/3 back precautions OT Short Term Goal 4 (Week 1): Pt will footwear wiht AE PRN at S level  Skilled Therapeutic Interventions/Progress Updates:    OT intervention with focus on functional amb with RW, standing balance, BADL retraining with bathing at shower level and dressing with sit<>stand from EOB. Pt amb with RW to bathroom and transferred to shower seat with CGA/min A. Pt completed bathing with supervision using lateral leans to bathe buttocks and LH sponge to bathe feet. Pt required assistance threading RLE into pants and donning socks. Pt returned to bed and remained in bed with all needs within reach and bed alarm activated. Pt commented that he was "worn out" after shower and that it was a good workout.   Therapy Documentation Precautions:  Precautions Precautions: Back, Fall Precaution Comments: reviewed back precautions Required Braces or Orthoses: Spinal Brace Spinal Brace: Lumbar corset, Applied in sitting position Restrictions Weight Bearing Restrictions: No   Pain:  Pt commented that his R hip and R foot were painful; MD aware, shower, repositioned  Therapy/Group: Individual Therapy  Rich Brave 12/29/2019, 10:59 AM

## 2019-12-30 ENCOUNTER — Inpatient Hospital Stay (HOSPITAL_COMMUNITY): Payer: Self-pay | Admitting: Physical Therapy

## 2019-12-30 ENCOUNTER — Inpatient Hospital Stay (HOSPITAL_COMMUNITY): Payer: Self-pay

## 2019-12-30 ENCOUNTER — Inpatient Hospital Stay (HOSPITAL_COMMUNITY): Payer: Self-pay | Admitting: *Deleted

## 2019-12-30 NOTE — Plan of Care (Signed)
  Problem: SCI BOWEL ELIMINATION Goal: RH STG MANAGE BOWEL WITH ASSISTANCE Description: STG Manage Bowel with mod I Assistance. Outcome: Progressing   Problem: SCI BLADDER ELIMINATION Goal: RH STG MANAGE BLADDER WITH ASSISTANCE Description: STG Manage Bladder With mod I Assistance Outcome: Progressing   Problem: RH SKIN INTEGRITY Goal: RH STG SKIN FREE OF INFECTION/BREAKDOWN Description: Remain free of infection/further breakdown during rehab stay  Outcome: Progressing   

## 2019-12-30 NOTE — Progress Notes (Signed)
Physical Therapy Weekly Progress Note  Patient Details  Name: Colton Nichols MRN: [id number] Date of Birth: 10/23/1965  Beginning of progress report period: December 23, 2019 End of progress report period: December 30, 2019  Today's Date: 12/30/2019  Patient has met 3 of 3 short term goals.  Pt is making good progress towards therapy goals. He is overall Supervision to CGA for bed mobility, Supervision to CGA for sit to stand transfers with RW, Supervision to CGA for gait with RW up to 150 ft, and CGA to min A for stair navigation. Pt has been limited in his ability to participate in therapy sessions at times due to ongoing pain in R hip and nerve pain throughout RLE but overall exhibits good motivation and participation in therapy sessions when pain is controlled.  Patient continues to demonstrate the following deficits muscle weakness, decreased cardiorespiratoy endurance, abnormal tone and decreased standing balance, decreased postural control, decreased balance strategies and difficulty maintaining precautions and therefore will continue to benefit from skilled PT intervention to increase functional independence with mobility.  Patient progressing toward long term goals..  Continue plan of care.  PT Short Term Goals Week 1:  PT Short Term Goal 1 (Week 1): Pt will complete least restrictive transfer with CGA consistently PT Short Term Goal 1 - Progress (Week 1): Met PT Short Term Goal 2 (Week 1): Pt will ambulate x 150 ft with LRAD and CGA PT Short Term Goal 2 - Progress (Week 1): Met PT Short Term Goal 3 (Week 1): Pt will navigate 6" stairs with min A PT Short Term Goal 3 - Progress (Week 1): Met Week 2:  PT Short Term Goal 1 (Week 2): =LTG due to ELOS  Therapy Documentation Precautions:  Precautions Precautions: Back, Fall Precaution Comments: reviewed back precautions Required Braces or Orthoses: Spinal Brace Spinal Brace: Lumbar corset, Applied in sitting  position Restrictions Weight Bearing Restrictions: No   Therapy/Group: Individual Therapy   Excell Seltzer, PT, DPT 12/30/2019, 7:35 AM

## 2019-12-30 NOTE — Progress Notes (Signed)
Physical Therapy Session Note  Patient Details  Name: Colton Nichols MRN: [id number] Date of Birth: Aug 17, 1965  Today's Date: 12/30/2019 PT Individual Time: 7989-2119; 1400-1500 PT Individual Time Calculation (min): 75 min and 60 min  Short Term Goals: Week 2:  PT Short Term Goal 1 (Week 2): =LTG due to ELOS  Skilled Therapeutic Interventions/Progress Updates:    Session 1: Pt received seated in bed, agreeable to PT session. No complaints of pain at rest, has onset of R low back/hip soreness with mobility. Pt is setup A to don lumbar corset while seated EOB. Sit to stand with Supervision to RW throughout session. Ambulation x 150 ft with RW and Supervision. Ascend/descend 12 x 6" stairs with R handrail laterally with CGA to simulate home environment. Standing alt L/R 6" step-taps with RW and CGA with 2.5# ankle weights x 15 reps each; lateral step-taps with RW and min A for balance. Sidesteps L/R 2 x 10 ft each direction with RW and min A for balance with OTB around thighs for LE strengthening. Pt left seated EOB with needs in reach, bed alarm in place, setup for breakfast at end of session.  Session 2: Pt received seated in bed, agreeable to PT session. Pt reports some soreness in R posterior hip/low back region. Pain not rated but pt requests pain medication at end of session. Pt also noted to have increase in pain with sit to stand transfer utilizing RUE on RW and LUE on bed, switched UE to LUE on RW and RUE on bed. Pt reports decrease in pain and muscle strain performing transfer in this manner. Bed mobility Supervision with use of hospital bed features. Pt is setup A for donning lumbar corset while seated EOB. Sit to stand with Supervision to CGA to RW throughout session. Ambulation 2 x 200 ft with RW and close Supervision. Ambulation through obstacle course stepping over objects, up/down 4" step, and up/down airex with RW and CGA for balance. Pt reports most difficulty navigating 4" step  with RW. Standing alt L/R forwards 4" step-ups with RW and min A for balance x 15 reps each. Standing RLE lateral 4" step-ups x 15 reps with RW and min A for balance. Pt reports increase in RLE soreness following therapy session due to working his muscles. Pt reports urgent need to use the bathroom. Toilet transfer with close Supervision and use of RW and grab bar. Pt is independent for clothing management and pericare. Pt requests to return to bed at end of session. Bed mobility Supervision. Pt left semi-reclined in bed with needs in reach, bed alarm in place at end of session.  Therapy Documentation Precautions:  Precautions Precautions: Back, Fall Precaution Comments: reviewed back precautions Required Braces or Orthoses: Spinal Brace Spinal Brace: Lumbar corset, Applied in sitting position Restrictions Weight Bearing Restrictions: No    Therapy/Group: Individual Therapy   Peter Congo, PT, DPT  12/30/2019, 12:51 PM

## 2019-12-30 NOTE — Evaluation (Signed)
Recreational Therapy Assessment and Plan  Patient Details  Name: CAMAR GUYTON MRN: [id number] Date of Birth: [birth year] Today's Date: 12/30/2019  Rehab Potential:  Good ELOS:   d/c 8/19 Assessment   Hospital Problem: Principal Problem:   Cauda equina syndrome [facility])   Past Medical History: History reviewed. No pertinent past medical history. Past Surgical History:       Past Surgical History:  Procedure Laterality Date  . LACERATION REPAIR    . POSTERIOR LUMBAR FUSION 4 LEVEL N/A 12/19/2019   Procedure: Lumbar Three Decompressive Laminectomy with Transpedicular Decompression, Lumbar one - Lumbar five Posterior Lateral Arthrodesis utilizing segmental pedicle screw fixation and local autografting.;  Surgeon: Earnie Larsson, MD;  Location: [place];  Service: Neurosurgery;  Laterality: N/A;    Assessment & Plan Clinical Impression:  Lorin Gawron is a 54 year old male in relatively good health who was admitted on 12/19/19 after a fall work. He struck the back of his head then landed on his buttocks and developed severe low back pain shooting in BLE as well as fecal incontinence. He was found to have L3 burst fracture with 1 cm retropulsion with pronounced canal compromise and fracture of bilateral spinal processes. He was evaluated by Dr. Annette Stable found to have mild sensory deficits in L4/5 and S1 dermatomes as well as cauda equina symptoms. He was taken to OR emergently for L3 decompressive Laminectomy with decompression of spinal canal for open reduction of L3 fracture on the same day. Follow up labs reveal ABLA as well as thrombocytopenia. He continues to have RLE>LLE weakness with sensory deficits, neuropathy and pain affecting ADLs and mobility. CIR recommended due to functional deficits. Patient transferred to CIR on 12/22/2019 .   Pt presents with decreased activity tolerance, decreased functional mobility, decreased balance, decreased coordination difficulty maintaining  precautions Limiting pt's independence with leisure/community pursuits.  Plan  Min 1 TR session >20 minutes during LOS  Recommendations for other services: None   Discharge Criteria: Patient will be discharged from TR if patient refuses treatment 3 consecutive times without medical reason.  If treatment goals not met, if there is a change in medical status, if patient makes no progress towards goals or if patient is discharged from hospital.  The above assessment, treatment plan, treatment alternatives and goals were discussed and mutually agreed upon: by patient  [place] 12/30/2019, 12:08 PM

## 2019-12-30 NOTE — Progress Notes (Signed)
[place] PHYSICAL MEDICINE & REHABILITATION PROGRESS NOTE   Subjective/Complaints:   Pt reports woke up at 5am with shooting pain from L knee to hip- took prn meds- helped.   Patch helpful for R foot- took shower- feels good otherwise.     ROS:   Pt denies SOB, abd pain, CP, N/V/C/D, and vision changes  Objective:   No results found. Recent Labs    12/28/19 0535 12/29/19 0617  WBC 8.4 8.3  HGB 7.8* 8.2*  HCT 23.7* 25.3*  PLT 513* 545*   Recent Labs    12/28/19 0535  NA 136  K 4.3  CL 100  CO2 26  GLUCOSE 100*  BUN 11  CREATININE 0.92  CALCIUM 9.3    Intake/Output Summary (Last 24 hours) at 12/30/2019 1601 Last data filed at 12/30/2019 0305 Gross per 24 hour  Intake 460 ml  Output 550 ml  Net -90 ml     Physical Exam: Vital Signs Blood pressure 128/78, pulse 78, temperature 98.8 F (37.1 C), temperature source Oral, resp. rate 18, height 6\' 1"  (1.854 m), weight 57.7 kg, SpO2 100 %.  General: appropriate, laying supine in bed; NAD Mood and affect are appropriate Heart: RRR Lungs: CTA B/L- no W/R/R- good air movement Abdomen: Soft, NT, ND, (+)BS   Extremities: No clubbing, cyanosis, or edema Skin: No evidence of breakdown, no evidence of rash Sensory able to distinguish LT in both feet- TTP over top of R foot- specifically on dorsum of R foot- less TTP today Neuro: Speech clear. Able to follow commands without difficulty. Significant back pain with attempts at RLE flexion/extenion.  RLE 2-/5 strength on HF and KE, 3/5 DF and PF, LLE and upper extremities with 5/5 strength Psych: Pt's affect is appropriate    Assessment/Plan: 1. Functional deficits secondary to cauda equina syndrome which require 3+ hours per day of interdisciplinary therapy in a comprehensive inpatient rehab setting.  Physiatrist is providing close team supervision and 24 hour management of active medical problems listed below.  Physiatrist and rehab team continue to assess  barriers to discharge/monitor patient progress toward functional and medical goals  Care Tool:  Bathing  Bathing activity did not occur: Refused Body parts bathed by patient: Right arm, Left arm, Chest, Abdomen, Front perineal area, Right upper leg, Left upper leg, Face, Buttocks, Right lower leg, Left lower leg         Bathing assist Assist Level: Contact Guard/Touching assist     Upper Body Dressing/Undressing Upper body dressing Upper body dressing/undressing activity did not occur (including orthotics): Refused What is the patient wearing?: Orthosis, Pull over shirt    Upper body assist Assist Level: Supervision/Verbal cueing    Lower Body Dressing/Undressing Lower body dressing    Lower body dressing activity did not occur: Refused What is the patient wearing?: Pants     Lower body assist Assist for lower body dressing: Minimal Assistance - Patient > 75%     Toileting Toileting    Toileting assist Assist for toileting: Moderate Assistance - Patient 50 - 74% Assistive Device Comment:  (urinal)   Transfers Chair/bed transfer  Transfers assist     Chair/bed transfer assist level: Contact Guard/Touching assist     Locomotion Ambulation   Ambulation assist      Assist level: Supervision/Verbal cueing Assistive device: Walker-rolling Max distance: 150   Walk 10 feet activity   Assist     Assist level: Supervision/Verbal cueing Assistive device: Walker-rolling   Walk 50 feet activity  Assist    Assist level: Supervision/Verbal cueing Assistive device: Walker-rolling    Walk 150 feet activity   Assist    Assist level: Supervision/Verbal cueing Assistive device: Walker-rolling    Walk 10 feet on uneven surface  activity   Assist Walk 10 feet on uneven surfaces activity did not occur: Safety/medical concerns   Assist level: Minimal Assistance - Patient > 75% Assistive device: Photographer Will  patient use wheelchair at discharge?: No             Wheelchair 50 feet with 2 turns activity    Assist            Wheelchair 150 feet activity     Assist          Blood pressure 128/78, pulse 78, temperature 98.8 F (37.1 C), temperature source Oral, resp. rate 18, height 6\' 1"  (1.854 m), weight 57.7 kg, SpO2 100 %.  Medical Problem List and Plan: 1.  Impaired mobility and ADLs secondary to cauda equina syndrome             -patient may shower but incision must be covered.              -ELOS/Goals: 7-8 days modI  -Continue CIR PT, OT     2.  Antithrombotics: -DVT/anticoagulation:  Mechanical: Sequential compression devices, below knee Bilateral lower extremities Order dopplers for am- not done yet  8/9- pt at high risk for DVT- will check into dopplers and Lovenox- will call NSU             -antiplatelet therapy: N/A 3. Pain Management:  Oxycodone and flexeril prn. Add Oxycontin 10 mg bid for consistent pain relief--still looks in quite a bit of pain. Will add gabapentin tid for neuropathy also. Add kpad. Add baclofen 8/6 for muscle spasms.   8/9- c/o "overdoing it"- will see how does today and maybe make changes tomorrow  8/10- will add lidoderm patch on top of R foot 8am-8pm for nerve pain  8/11- was helpful for pain- when it was on- wants to continue 4. Mood: LCSW to follow for evaluation and support.              -antipsychotic agents: N/A 5. Neuropsych: This patient is capable of making decisions on his own behalf. 6. Skin/Wound Care: Spinal incision with honeycomb dressing. IV d/ced.  7. Fluids/Electrolytes/Nutrition: Monitor I/O. Add protein supplement for low calorie malnutrition. K+ 3.7 on 8/2.  Normal on 8/4  8. Leucocytosis: Likely reactive--17.1-->12.8. Normal on 8/4 9. ABLA: Hgb 9.2 on 8/2. 7.4 on 8/5. Iron level 10 on 8/5- started iron supplement . 8/9- Hb 7.8- back up slightly 10. Thrombocytopenia: Progressive--202-->112. No heparin or enoxaparin,  recheck CBC in am   8/10- Plts 545k 11. Hyponatremia improved nl on 8/4  8/9- Na 136- resolved 12. Constipation: Schedule Miralax daily in am --effective.  Had good BM 8/5 13. HTN: Monitor BP TID -- very well controlled.  Vitals:   12/29/19 1931 12/30/19 0309  BP: 118/83 128/78  Pulse: (!) 105 78  Resp: 18 18  Temp: 99 F (37.2 C) 98.8 F (37.1 C)  SpO2: 100% 100%  8/11- BP controlled- con't regimen 14. Hypomagnesemia:resolved  Mg level improved to 2.2 on 8/5  15. "freezing"  8/9- maybe overdid it- WBC nml- U/A (-)- doing better in afternoon- will monitor  8/10- feeling better today 16. Nerve pain  8/11- lidoderm patch is helpful  LOS: 8 days A FACE TO FACE EVALUATION WAS PERFORMED  Colton Nichols 12/30/2019, 8:33 AM

## 2019-12-30 NOTE — Progress Notes (Signed)
Occupational Therapy Session Note  Patient Details  Name: RONN SMOLINSKY MRN: [id number] Date of Birth: 12/23/1965  Today's Date: 12/30/2019 OT Individual Time: 2863-8177 OT Individual Time Calculation (min): 70 min    Short Term Goals: Week 2:  OT Short Term Goal 1 (Week 2): STG=LTG secondary to ELOS  Skilled Therapeutic Interventions/Progress Updates:    Pt sitting EOB upon arrival and ready for therapy.  Initial focus on donning LSO, amb with RW in room, standing at sink to shave and brush teeth.  Pt completed all tasks with supervision.  Pt amb with RW in hallway to tub room and practiced TTB tranfsers with supervision.  Pt amb into gym and engaged in [employer] activities with focus on standing balance.  Pt at supervision level throughout session with min verbal cues for safety awareness. Pt returned to room and sat EOB awaiting lunch. Bed alarm activated.  Therapy Documentation Precautions:  Precautions Precautions: Back, Fall Precaution Comments: reviewed back precautions Required Braces or Orthoses: Spinal Brace Spinal Brace: Lumbar corset, Applied in sitting position Restrictions Weight Bearing Restrictions: No   Pain:  Pt c/o R thigh "shooting" pain; relief with activity, emotional support   Therapy/Group: Individual Therapy  Rich Brave 12/30/2019, 11:57 AM

## 2019-12-30 NOTE — Progress Notes (Signed)
Occupational Therapy Weekly Progress Note  Patient Details  Name: Colton Nichols MRN: [id number] Date of Birth: 1965/08/25  Beginning of progress report period: December 23, 2019 End of progress report period: December 30, 2019  Patient has met 4 of 4 short term goals.  Pt is making steady progress despite having missed 2 sessions of OT secondary to increased pain and fatigue.  Pt completes all functional transfers and amb with RW with CGA/supervision.  Pt completes bathing tasks at shower level with supervision using lateral leans for bathing buttocks and LH sponge to bathe feet.  Pt requires assistance donning Ted Hose on R foot but complets all other LB dressing tasks with supervision. Toileting with supervision.  Pt requires min verbal cues for donning LSO.   Patient continues to demonstrate the following deficits: muscle weakness, decreased cardiorespiratoy endurance, impaired timing and sequencing, abnormal tone, unbalanced muscle activation and decreased coordination and decreased sitting balance, decreased standing balance, decreased postural control and decreased balance strategies and therefore will continue to benefit from skilled OT intervention to enhance overall performance with BADL.  Patient progressing toward long term goals..  Continue plan of care.  OT Short Term Goals Week 1:  OT Short Term Goal 1 (Week 1): Pt will transfer to toilet wiht S overall OT Short Term Goal 1 - Progress (Week 1): Met OT Short Term Goal 2 (Week 1): Pt will complete 3/3 toileting components with CGA OT Short Term Goal 2 - Progress (Week 1): Met OT Short Term Goal 3 (Week 1): Pt will recall 3/3 back precautions OT Short Term Goal 3 - Progress (Week 1): Met OT Short Term Goal 4 (Week 1): Pt will footwear wiht AE PRN at S level OT Short Term Goal 4 - Progress (Week 1): Met Week 2:  OT Short Term Goal 1 (Week 2): STG=LTG secondary to ELOS   Leroy Libman 12/30/2019, 6:18 AM

## 2019-12-31 ENCOUNTER — Inpatient Hospital Stay (HOSPITAL_COMMUNITY): Payer: Self-pay

## 2019-12-31 ENCOUNTER — Inpatient Hospital Stay (HOSPITAL_COMMUNITY): Payer: Self-pay | Admitting: Physical Therapy

## 2019-12-31 NOTE — Progress Notes (Signed)
Recreational Therapy Session Note  Patient Details  Name: Colton Nichols MRN: [id number] Date of Birth: 07/09/1965 Today's Date: 12/31/2019  Pain: no c/o Skilled Therapeutic Interventions/Progress Updates: Session focused on simulated community reintegration task ambulatory level.  Pt ambulated throughout the hospital inside and outside on evan and uneven surfaces with RW with supervision.  Pt demonstrated good safety awareness throughout session anticipating potential fall hazards independently. Education provided on safety risks & energy conservation techniques.  Returned to the unit and practiced walking up and down a ramp and over mulched surfaces with supervision.  Therapy/Group: Co-Treatment Anthonyjames Bargar 12/31/2019, 2:50 PM

## 2019-12-31 NOTE — Progress Notes (Signed)
Patient ID: Colton Nichols, male   DOB: July 23, 1965, 54 y.o.   MRN: [id number]  SW met with pt in room to get updates on if he has DME at home. Pt states his mother returns from vacation on this Saturday and he will have more information when she returns. SW to follow-up.   Loralee Pacas, MSW, [place] Office: [phone] Cell: [phone] Fax: [phone]

## 2019-12-31 NOTE — Progress Notes (Signed)
Physical Therapy Session Note  Patient Details  Name: Colton Nichols MRN: [id number] Date of Birth: 04/06/1966  Today's Date: 12/31/2019 PT Individual Time: 1452-1530 PT Individual Time Calculation (min): 38 min   Short Term Goals: Week 2:  PT Short Term Goal 1 (Week 2): =LTG due to ELOS  Skilled Therapeutic Interventions/Progress Updates:   Pt received supine in bed and agreeable to PT. Supine>sit transfer with UE support on bed rail. Pt able to don LSO, socks and shoes sitting OEB with supervision assist from PT. Only min cues to maintain back precautions.   Gait training through hall 2 x 371f with distant supervision assist from PT with min cues for safety in turns as well as  Cues to stop if there is any need to lift UE support from RW. Dynamic gait training to weave through 8 cones x 2 with supervision assist and min cues for AD management in tight spaces.   Nustep with BUE/BLE x 547m and BLEonly x 3 min, short therapeutic rest break between bouts and cues for improved symmetry on the RLEvs LLE.   Pt returned to room and performed ambulatory transfer to bed with supervision assist and RW. Pt doffed LSO and shoes sitting EOB with cues for decreased twisting in low back. Sit>supine completed with distant supervision assist for safety with cues for back precautions, and left supine in bed with call bell in reach and all needs met.           Therapy Documentation Precautions:  Precautions Precautions: Back, Fall Precaution Comments: reviewed back precautions Required Braces or Orthoses: Spinal Brace Spinal Brace: Lumbar corset, Applied in sitting position Restrictions Weight Bearing Restrictions: No Pain: denies   Vital Signs: Therapy Vitals Temp: 98.4 F (36.9 C) Pulse Rate: 94 Resp: (!) 22 BP: 122/71 Patient Position (if appropriate): Lying Oxygen Therapy SpO2: 100 % O2 Device: Room Air    Therapy/Group: Individual Therapy  AuLorie Phenix/04/2020, 4:33  PM

## 2019-12-31 NOTE — Progress Notes (Signed)
eOccupational Therapy Session Note  Patient Details  Name: [name] MRN: [id number] Date of Birth: 03-Jun-1965  Today's Date: 12/31/2019 OT Individual Time: 1300-1345 OT Individual Time Calculation (min): 45 min    Short Term Goals: Week 2:  OT Short Term Goal 1 (Week 2): STG=LTG secondary to ELOS  Skilled Therapeutic Interventions/Progress Updates:    Cotreatment with Recreational Therapist.  OT intervention with focus on community mobility and integration, discharge planning, safety awareness in community, energy conservation strategies, activity tolerance, functional amb with RW. Pt amb with RW in hallway, Atrium, and on uneven surfaces at entrance to hospital.  Pt amb down and up incline.  Pt accessed public restroom and standard toilet stall. No unsafe behaviors noted. Pt completed all tasks/activties at supervision level. Pt returned to room and returned to bed.  Pt remained in bed with all needs within reach and bed alarm activated.   Therapy Documentation Precautions:  Precautions Precautions: Back, Fall Precaution Comments: reviewed back precautions Required Braces or Orthoses: Spinal Brace Spinal Brace: Lumbar corset, Applied in sitting position Restrictions Weight Bearing Restrictions: No   Pain:  Pt commented that his back was a "little stiff" but it was "ok"; rated at 4/10; repositioned   Therapy/Group: Individual Therapy  Rich Brave 12/31/2019, 2:44 PM

## 2019-12-31 NOTE — Progress Notes (Signed)
Recreational Therapy Discharge Summary Patient Details  Name: ROMA BIERLEIN MRN: [id number] Date of Birth: 09/14/1965 Today's Date: 12/31/2019  Long term goals set: 1  Long term goals met: 1  Comments on progress toward goals: TR sessions/education focused on awareness of the various components of health & wellness including physical, emotional, social,  spiritual & cognitive.  Also discussed activity analysis identifying potential modifications and community reintegration.  Pt is extremely motivated to regain further independence.  Pt participated in simulated community reintegration task ambulatory level with supervision and use of RW.  Education provided on safety risks and energy conservation.  Goal met.  Pt is scheduled for discharge home with mother 8/19 at Mod I level.  Reasons goals not met: n/a  Equipment acquired: n/a  Reasons for discharge: discharge from hospital  Follow-up: [facility]?  Patient/family agrees with progress made and goals achieved: Yes  Analucia Hush 12/31/2019, 2:57 PM

## 2019-12-31 NOTE — Progress Notes (Signed)
Physical Therapy Session Note  Patient Details  Name: Colton Nichols MRN: [id number] Date of Birth: [birth year]  Today's Date: 12/31/2019 PT Individual Time: 1884-1660 PT Individual Time Calculation (min): 38 min   Short Term Goals: Week 2:  PT Short Term Goal 1 (Week 2): =LTG due to ELOS  Skilled Therapeutic Interventions/Progress Updates:     Pt received supine in bed and agreeable to therapy. Reports pain in low back. Number not provided. PT provides bracing and rest break to manage pain. Supine to sit with logrolling technique and use of bed features. PT provides maxA to don socks and shoes and set-up assist for LSO. Pt performs sit to stand with CGA, ambulating 300' with CGA and verbal cues for upright gaze to improve balance and increased hip extension for improved posture and body mechanics.   Pt performs corn hole game for NMR for standing balance, as well as strengthening of RLE, stepping forward during toss and eccentrically controlling knee flexion with RLE. Pt uses LUE support on RW and PT provides CGA. No buckling noted.   Pt ambulates 300' back to room with RW and CGA. Left seated at EOB with alarm intact and all needs within reach.  Therapy Documentation Precautions:  Precautions Precautions: Back, Fall Precaution Comments: reviewed back precautions Required Braces or Orthoses: Spinal Brace Spinal Brace: Lumbar corset, Applied in sitting position Restrictions Weight Bearing Restrictions: No   Therapy/Group: Individual Therapy  Beau Fanny, PT, DPT 12/31/2019, 12:52 PM

## 2019-12-31 NOTE — Progress Notes (Signed)
[place] PHYSICAL MEDICINE & REHABILITATION PROGRESS NOTE   Subjective/Complaints:   Pt reports busy day today- is calling SS/disability about trying to start claim- LBM last night- starting to go daily.   Spirits doing well- strength getting better- somewhat.  Needs to eat breakfast.   ROS:   Pt denies SOB, abd pain, CP, N/V/C/D, and vision changes   Objective:   No results found. Recent Labs    12/29/19 0617  WBC 8.3  HGB 8.2*  HCT 25.3*  PLT 545*   No results for input(s): NA, K, CL, CO2, GLUCOSE, BUN, CREATININE, CALCIUM in the last 72 hours.  Intake/Output Summary (Last 24 hours) at 12/31/2019 0839 Last data filed at 12/31/2019 0831 Gross per 24 hour  Intake 480 ml  Output 600 ml  Net -120 ml     Physical Exam: Vital Signs Blood pressure 101/72, pulse 75, temperature 98.2 F (36.8 C), resp. rate 18, height 6\' 1"  (1.854 m), weight 57.7 kg, SpO2 100 %.  General: appropriate, sitting up in bed- plugging in phone, NAD Psych: Mood and affect are appropriate- bright Heart: RRR Lungs: CTA B/L- no W/R/R- good air movement Abdomen: Soft, NT, ND, (+)BS  Extremities: No clubbing, cyanosis, or edema Skin: No evidence of breakdown, no evidence of rash Sensory able to distinguish LT in both feet- TTP over top of R foot- specifically on dorsum of R foot- less TTP today Neuro: Speech clear. Able to follow commands without difficulty. Significant back pain with attempts at RLE flexion/ext.  RLE 2-/5 strength on HF and KE, 3/5 DF and PF, LLE and upper extremities with 5/5 strength     Assessment/Plan: 1. Functional deficits secondary to cauda equina syndrome which require 3+ hours per day of interdisciplinary therapy in a comprehensive inpatient rehab setting.  Physiatrist is providing close team supervision and 24 hour management of active medical problems listed below.  Physiatrist and rehab team continue to assess barriers to discharge/monitor patient progress  toward functional and medical goals  Care Tool:  Bathing  Bathing activity did not occur: Refused Body parts bathed by patient: Right arm, Left arm, Chest, Abdomen, Front perineal area, Right upper leg, Left upper leg, Face, Buttocks, Right lower leg, Left lower leg         Bathing assist Assist Level: Contact Guard/Touching assist     Upper Body Dressing/Undressing Upper body dressing Upper body dressing/undressing activity did not occur (including orthotics): Refused What is the patient wearing?: Orthosis, Pull over shirt    Upper body assist Assist Level: Supervision/Verbal cueing    Lower Body Dressing/Undressing Lower body dressing    Lower body dressing activity did not occur: Refused What is the patient wearing?: Pants     Lower body assist Assist for lower body dressing: Minimal Assistance - Patient > 75%     Toileting Toileting    Toileting assist Assist for toileting: Moderate Assistance - Patient 50 - 74% Assistive Device Comment:  (urinal)   Transfers Chair/bed transfer  Transfers assist     Chair/bed transfer assist level: Supervision/Verbal cueing     Locomotion Ambulation   Ambulation assist      Assist level: Supervision/Verbal cueing Assistive device: Walker-rolling Max distance: 150   Walk 10 feet activity   Assist     Assist level: Supervision/Verbal cueing Assistive device: Walker-rolling   Walk 50 feet activity   Assist    Assist level: Supervision/Verbal cueing Assistive device: Walker-rolling    Walk 150 feet activity   Assist  Assist level: Supervision/Verbal cueing Assistive device: Walker-rolling    Walk 10 feet on uneven surface  activity   Assist Walk 10 feet on uneven surfaces activity did not occur: Safety/medical concerns   Assist level: Minimal Assistance - Patient > 75% Assistive device: Photographer Will patient use wheelchair at discharge?: No              Wheelchair 50 feet with 2 turns activity    Assist            Wheelchair 150 feet activity     Assist          Blood pressure 101/72, pulse 75, temperature 98.2 F (36.8 C), resp. rate 18, height 6\' 1"  (1.854 m), weight 57.7 kg, SpO2 100 %.  Medical Problem List and Plan: 1.  Impaired mobility and ADLs secondary to cauda equina syndrome             -patient may shower but incision must be covered.              -ELOS/Goals: 7-8 days modI  -Continue CIR PT, OT     2.  Antithrombotics: -DVT/anticoagulation:  Mechanical: Sequential compression devices, below knee Bilateral lower extremities Order dopplers for am- not done yet  8/9- pt at high risk for DVT- will check into dopplers and Lovenox- will call NSU             -antiplatelet therapy: N/A 3. Pain Management:  Oxycodone and flexeril prn. Add Oxycontin 10 mg bid for consistent pain relief--still looks in quite a bit of pain. Will add gabapentin tid for neuropathy also. Add kpad. Add baclofen 8/6 for muscle spasms.   8/9- c/o "overdoing it"- will see how does today and maybe make changes tomorrow  8/10- will add lidoderm patch on top of R foot 8am-8pm for nerve pain  8/11- was helpful for pain- when it was on- wants to continue  8/12- pain stable- con't regimen 4. Mood: LCSW to follow for evaluation and support.              -antipsychotic agents: N/A 5. Neuropsych: This patient is capable of making decisions on his own behalf. 6. Skin/Wound Care: Spinal incision with honeycomb dressing. IV d/ced.   8/12- d/c honeycomb dressing and change dressing daily.  7. Fluids/Electrolytes/Nutrition: Monitor I/O. Add protein supplement for low calorie malnutrition. K+ 3.7 on 8/2.  Normal on 8/4  8. Leucocytosis: Likely reactive--17.1-->12.8. Normal on 8/4 9. ABLA: Hgb 9.2 on 8/2. 7.4 on 8/5. Iron level 10 on 8/5- started iron supplement . 8/9- Hb 7.8- back up slightly 10. Thrombocytopenia: Progressive--202-->112. No heparin  or enoxaparin, recheck CBC in am   8/10- Plts 545k 11. Hyponatremia improved nl on 8/4  8/9- Na 136- resolved 12. Constipation: Schedule Miralax daily in am --effective.  Had good BM 8/5  8/12- having BMs daily per pt- at night 13. HTN: Monitor BP TID -- very well controlled.  Vitals:   12/30/19 1927 12/31/19 0520  BP: (!) 144/80 101/72  Pulse: 89 75  Resp: 18 18  Temp: 98.6 F (37 C) 98.2 F (36.8 C)  SpO2: 100% 100%  8/12- BP overall controlled- con't regimen 14. Hypomagnesemia:resolved  Mg level improved to 2.2 on 8/5  15. "freezing"  8/9- maybe overdid it- WBC nml- U/A (-)- doing better in afternoon- will monitor  8/10- feeling better today 16. Nerve pain  8/11- lidoderm patch is helpful    LOS:  9 days A FACE TO FACE EVALUATION WAS PERFORMED  Phyllip Claw 12/31/2019, 8:39 AM

## 2019-12-31 NOTE — Progress Notes (Signed)
Occupational Therapy Session Note  Patient Details  Name: Colton Nichols MRN: [id number] Date of Birth: 01/30/1966  Today's Date: 12/31/2019 OT Individual Time: 1100-1155 OT Individual Time Calculation (min): 55 min    Short Term Goals: Week 2:  OT Short Term Goal 1 (Week 2): STG=LTG secondary to ELOS  Skilled Therapeutic Interventions/Progress Updates:    OT intervention with focus on BADL retraining, functional amb with RW, standing balance, discharge planning, and safety awareness to increase independence with BADLs. Pt amb in room with RW to access bathroom and completed bathing/dressing tasks.  Pt dons/doffs LSO independently. Pt completed all tasks with supervision including donning [facility] with adaptive equipment. Pt adheres to back precautions independently. Pt requires more then a reasonable amount of time to complete all tasks safely. Pt remained in bed with all needs within reach and bed alarm activated.   Therapy Documentation Precautions:  Precautions Precautions: Back, Fall Precaution Comments: reviewed back precautions Required Braces or Orthoses: Spinal Brace Spinal Brace: Lumbar corset, Applied in sitting position Restrictions Weight Bearing Restrictions: No    Pain: Pain Assessment Pain Scale: 0-10 Pain Score: 2    Therapy/Group: Individual Therapy  Rich Brave 12/31/2019, 11:59 AM

## 2020-01-01 ENCOUNTER — Inpatient Hospital Stay (HOSPITAL_COMMUNITY): Payer: Self-pay

## 2020-01-01 ENCOUNTER — Inpatient Hospital Stay (HOSPITAL_COMMUNITY): Payer: Self-pay | Admitting: Physical Therapy

## 2020-01-01 ENCOUNTER — Inpatient Hospital Stay (HOSPITAL_COMMUNITY): Payer: Self-pay | Admitting: Occupational Therapy

## 2020-01-01 ENCOUNTER — Encounter (HOSPITAL_COMMUNITY): Payer: Self-pay | Admitting: Psychology
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
## 2020-01-01 NOTE — Progress Notes (Signed)
Occupational Therapy Session Note  Patient Details  Name: Colton Nichols MRN: [id number] Date of Birth: [birth year]  Today's Date: 01/01/2020 OT Individual Time: 1300-1425 OT Individual Time Calculation (min): 85 min    Short Term Goals: Week 2:  OT Short Term Goal 1 (Week 2): STG=LTG secondary to ELOS  Skilled Therapeutic Interventions/Progress Updates:    Pt resting in recliner upon arrival.  Pt declined shower but agreeable to therapy.  Pt amb with RW to Day Room and engaged in BLE/BUE therex on NuStep for general conditioning/strengthening-7 mins load 5. Pt engaged in Wii [place] while standing for complete game.  After a rest pt engaged in Wii balance board activities - Penguin X 3 and tilt table X 3 with CGA/close supervision. Pt with controlled weight shifts with increased difficulty shifting weight onto toes. Pt amb with RW to gym and engaged in standing activity with rebounder and soccer ball.  Pt completed activity with close supervision. Pt requested to use toilet and amb with RW back to room.  Pt completed all toileting tasks with supervision.  Pt returned to recliner with all needs within reach and seat alarm activated.   Therapy Documentation Precautions:  Precautions Precautions: Back, Fall Precaution Comments: reviewed back precautions Required Braces or Orthoses: Spinal Brace Spinal Brace: Lumbar corset, Applied in sitting position Restrictions Weight Bearing Restrictions: No   Pain:  Pt c/o 3/10 back pain at beginning of session and 6/10 pain after activity; repositioned   Therapy/Group: Individual Therapy  Rich Brave 01/01/2020, 2:27 PM

## 2020-01-01 NOTE — Progress Notes (Signed)
Physical Therapy Session Note  Patient Details  Name: Colton Nichols MRN: [id number] Date of Birth: 03-11-1966  Today's Date: 01/01/2020 PT Individual Time: 1000-1100 PT Individual Time Calculation (min): 60 min   Short Term Goals: Week 1:  PT Short Term Goal 1 (Week 1): Pt will complete least restrictive transfer with CGA consistently PT Short Term Goal 1 - Progress (Week 1): Met PT Short Term Goal 2 (Week 1): Pt will ambulate x 150 ft with LRAD and CGA PT Short Term Goal 2 - Progress (Week 1): Met PT Short Term Goal 3 (Week 1): Pt will navigate 6" stairs with min A PT Short Term Goal 3 - Progress (Week 1): Met Week 2:  PT Short Term Goal 1 (Week 2): =LTG due to ELOS  Skilled Therapeutic Interventions/Progress Updates:    pt received in chair and agreeable to therapy. Pt directed in gait training from room, taken outside for gait training on multiple surfaces, stairs, and grades of ground level, pt did not utilize WC thoughout this session; total of 75' to elevator, stood in Media planner, gait training with RW 1000'+ outside on sidewalks, brick surfaces, pavement at CGA with pt able to safely navigation these objects and distractions with minimal cues and min A -CGA for stair navigation VC for stepping pattern on stairs as well. Pt returned inside, stood in elevator, returned to unit, ambulated to gym additional 200' and took sitting rest break at [facility]. Nursing aware of pt leaving unit with therapy prior to leaving. Pt directed in standing BLE strengthening exercises with use of chair back for support: 2x10 B LE hip abduction, adduction, calf raises, and hip extension with seated rest breaks between sets. Pt directed in gait training to return to room 200' at [facility] with RW and VC for trunk extension and increased step height with pt reported fatigue. Pt returned to room, left in chair, alarm set, All needs in reach and in good condition. Call light in hand.   Therapy Documentation Precautions:   Precautions Precautions: Back, Fall Precaution Comments: reviewed back precautions Required Braces or Orthoses: Spinal Brace Spinal Brace: Lumbar corset, Applied in sitting position Restrictions Weight Bearing Restrictions: No    Therapy/Group: Individual Therapy  Junie Panning 01/01/2020, 3:50 PM

## 2020-01-01 NOTE — Progress Notes (Signed)
Occupational Therapy Session Note  Patient Details  Name: Colton Nichols MRN: [id number] Date of Birth: 30-Mar-1966  Today's Date: 01/01/2020 OT Individual Time: 4193-7902 OT Individual Time Calculation (min): 56 min   Short Term Goals: Week 2:  OT Short Term Goal 1 (Week 2): STG=LTG secondary to ELOS    Skilled Therapeutic Interventions/Progress Updates:    Pt greeted in bed, reporting having Colton HA. With pt consent, OT provided him lavender to use for aromatherapy, placed in pillowcase for therapeutic effect during EOB activity. Pt completed supine<sit unassisted while adhering to back precautions with HOB elevated, he states that he is unsure if he will have to use Colton flat bed at home. Pt donned his back brace with setup assistance and then ate Colton little of his breakfast. OT provided him with Colton reacher bag for his device and we discussed functional uses at home for back precaution adherence during daily routine. He then used an adaptive bag technique to don his Ted hose with setup assistance, reacher used for retrieving shoes from floor with setup as well. Afterwards pt ambulated with device and supervision to the sink where he completed oral care and face washing in standing. He was motivated to ambulate to "work out" some of his tightness/pain in Rt foot, so pt completed functional ambulation with RW in the unit, heading towards [name]. Vcs for decreasing UE reliance on RW to improve upright posture and to strengthen LEs. Also discussed gentle Rt ankle ROM to help with improving his foot pain. At end of session pt transferred to the recliner and was left to finish his breakfast, in care of NT who was making up his bed. Tx focus placed on dynamic standing balance, adaptive self care skills, and d/c planning.    Therapy Documentation Precautions:  Precautions Precautions: Back, Fall Precaution Comments: reviewed back precautions Required Braces or Orthoses: Spinal  Brace Spinal Brace: Lumbar corset, Applied in sitting position Restrictions Weight Bearing Restrictions: No Pain: in the Rt foot/back, RN in to provide pain medicine during session  Pain Assessment Pain Scale: 0-10 Pain Score: 2  ADL: ADL Grooming: Setup Where Assessed-Grooming: Sitting at sink Lower Body Dressing: Supervision/safety (footwear only) Where Assessed-Lower Body Dressing: Wheelchair Toileting: Moderate assistance (simulated) Where Assessed-Toileting: Bedside Commode Toilet Transfer: Minimal assistance Toilet Transfer Equipment: Bedside commode, Grab bars (RW) Walk-In Shower Transfer: Insurance underwriter Method: Stand pivot, Designer, industrial/product: Shower seat with back      Therapy/Group: Individual Therapy  Colton Nichols Colton Nichols 01/01/2020, 12:16 PM

## 2020-01-01 NOTE — Progress Notes (Signed)
[facility] PHYSICAL MEDICINE & REHABILITATION PROGRESS NOTE   Subjective/Complaints:   Pt reports had a bad night- was stressed- on phone with SSI for 3.5 hours and then dreams very stressed.  Also had a Headache he couldn't get rid of all night.      ROS:   Pt denies SOB, abd pain, CP, N/V/C/D, and vision changes  Objective:   No results found. No results for input(s): WBC, HGB, HCT, PLT in the last 72 hours. No results for input(s): NA, K, CL, CO2, GLUCOSE, BUN, CREATININE, CALCIUM in the last 72 hours.  Intake/Output Summary (Last 24 hours) at 01/01/2020 0847 Last data filed at 01/01/2020 7106 Gross per 24 hour  Intake 931 ml  Output 1000 ml  Net -69 ml     Physical Exam: Vital Signs Blood pressure 126/87, pulse 76, temperature 98.7 F (37.1 C), resp. rate 17, height 6\' 1"  (1.854 m), weight 57.7 kg, SpO2 99 %.  General: appropriate, laying in bed-appropriate, NAD Psych: Mood and affect stressed, somewhat depressed today Heart: RRR Lungs: CTA B/L- no W/R/R- good air movement Abdomen: Soft, NT, ND, (+)BS  Extremities: No clubbing, cyanosis, or edema Skin: No evidence of breakdown, no evidence of rash Sensory able to distinguish LT in both feet- TTP over top of R foot- specifically on dorsum of R foot- less TTP today Neuro: Speech clear. Able to follow commands without difficulty. Significant back pain with attempts at RLE flexion/ext.  RLE 2-/5 strength on HF and KE, 3/5 DF and PF, LLE and upper extremities with 5/5 strength     Assessment/Plan: 1. Functional deficits secondary to cauda equina syndrome which require 3+ hours per day of interdisciplinary therapy in a comprehensive inpatient rehab setting.  Physiatrist is providing close team supervision and 24 hour management of active medical problems listed below.  Physiatrist and rehab team continue to assess barriers to discharge/monitor patient progress toward functional and medical goals  Care  Tool:  Bathing  Bathing activity did not occur: Refused Body parts bathed by patient: Right arm, Left arm, Chest, Abdomen, Front perineal area, Right upper leg, Left upper leg, Face, Buttocks, Right lower leg, Left lower leg         Bathing assist Assist Level: Supervision/Verbal cueing     Upper Body Dressing/Undressing Upper body dressing Upper body dressing/undressing activity did not occur (including orthotics): Refused What is the patient wearing?: Orthosis, Pull over shirt    Upper body assist Assist Level: Supervision/Verbal cueing    Lower Body Dressing/Undressing Lower body dressing    Lower body dressing activity did not occur: Refused What is the patient wearing?: Pants     Lower body assist Assist for lower body dressing: Supervision/Verbal cueing     Toileting Toileting    Toileting assist Assist for toileting: Moderate Assistance - Patient 50 - 74% Assistive Device Comment:  (urinal)   Transfers Chair/bed transfer  Transfers assist     Chair/bed transfer assist level: Contact Guard/Touching assist     Locomotion Ambulation   Ambulation assist      Assist level: Contact Guard/Touching assist Assistive device: Walker-rolling Max distance: 300'   Walk 10 feet activity   Assist     Assist level: Contact Guard/Touching assist Assistive device: Walker-rolling   Walk 50 feet activity   Assist    Assist level: Contact Guard/Touching assist Assistive device: Walker-rolling    Walk 150 feet activity   Assist    Assist level: Contact Guard/Touching assist Assistive device: Walker-rolling  Walk 10 feet on uneven surface  activity   Assist Walk 10 feet on uneven surfaces activity did not occur: Safety/medical concerns   Assist level: Minimal Assistance - Patient > 75% Assistive device: Photographer Will patient use wheelchair at discharge?: No             Wheelchair 50 feet with 2  turns activity    Assist            Wheelchair 150 feet activity     Assist          Blood pressure 126/87, pulse 76, temperature 98.7 F (37.1 C), resp. rate 17, height 6\' 1"  (1.854 m), weight 57.7 kg, SpO2 99 %.  Medical Problem List and Plan: 1.  Impaired mobility and ADLs secondary to cauda equina syndrome             -patient may shower but incision must be covered.  8/13- working on SSI/disability              -ELOS/Goals: 7-8 days modI  -Continue CIR PT, OT     2.  Antithrombotics: -DVT/anticoagulation:  Mechanical: Sequential compression devices, below knee Bilateral lower extremities Order dopplers for am- not done yet  8/9- pt at high risk for DVT- will check into dopplers and Lovenox- will call NSU             -antiplatelet therapy: N/A 3. Pain Management:  Oxycodone and flexeril prn. Add Oxycontin 10 mg bid for consistent pain relief--still looks in quite a bit of pain. Will add gabapentin tid for neuropathy also. Add kpad. Add baclofen 8/6 for muscle spasms.   8/9- c/o "overdoing it"- will see how does today and maybe make changes tomorrow  8/10- will add lidoderm patch on top of R foot 8am-8pm for nerve pain  8/11- was helpful for pain- when it was on- wants to continue  8/12- pain stable- con't regimen 4. Mood: LCSW to follow for evaluation and support.              -antipsychotic agents: N/A 5. Neuropsych: This patient is capable of making decisions on his own behalf. 6. Skin/Wound Care: Spinal incision with honeycomb dressing. IV d/ced.   8/12- d/c honeycomb dressing and change dressing daily.  7. Fluids/Electrolytes/Nutrition: Monitor I/O. Add protein supplement for low calorie malnutrition. K+ 3.7 on 8/2.  Normal on 8/4  8. Leucocytosis: Likely reactive--17.1-->12.8. Normal on 8/4 9. ABLA: Hgb 9.2 on 8/2. 7.4 on 8/5. Iron level 10 on 8/5- started iron supplement . 8/9- Hb 7.8- back up slightly 10. Thrombocytopenia: Progressive--202-->112. No heparin  or enoxaparin, recheck CBC in am   8/10- Plts 545k 11. Hyponatremia improved nl on 8/4  8/9- Na 136- resolved 12. Constipation: Schedule Miralax daily in am --effective.  Had good BM 8/5  8/12- having BMs daily per pt- at night 13. HTN: Monitor BP TID -- very well controlled.  Vitals:   12/31/19 1932 01/01/20 0624  BP: 140/87 126/87  Pulse: 87 76  Resp: 18 17  Temp: 98.3 F (36.8 C) 98.7 F (37.1 C)  SpO2: 100% 99%  8/13- BP controlled- con't meds 14. Hypomagnesemia:resolved  Mg level improved to 2.2 on 8/5  15. "freezing"  8/9- maybe overdid it- WBC nml- U/A (-)- doing better in afternoon- will monitor  8/10- feeling better today 16. Nerve pain  8/11- lidoderm patch is helpful    LOS: 10 days A FACE TO FACE EVALUATION  WAS PERFORMED  Colton Nichols 01/01/2020, 8:47 AM

## 2020-01-02 ENCOUNTER — Encounter (HOSPITAL_COMMUNITY): Payer: Self-pay | Admitting: Occupational Therapy

## 2020-01-02 ENCOUNTER — Inpatient Hospital Stay (HOSPITAL_COMMUNITY): Payer: Self-pay | Admitting: Physical Therapy

## 2020-01-02 MED ORDER — CARBAMIDE PEROXIDE 6.5 % OT SOLN
5.0000 [drp] | Freq: Two times a day (BID) | OTIC | Status: DC
  Administered 2020-01-02 – 2020-01-07 (×11): 5 [drp] via OTIC
  Filled 2020-01-02: qty 15

## 2020-01-02 NOTE — Progress Notes (Signed)
Physical Therapy Session Note  Patient Details  Name: Colton Nichols MRN: [id number] Date of Birth: May 04, 1966  Today's Date: 01/02/2020 PT Individual Time: 4097-3532 PT Individual Time Calculation (min): 48 min   Short Term Goals: Week 1:  PT Short Term Goal 1 (Week 1): Pt will complete least restrictive transfer with CGA consistently PT Short Term Goal 1 - Progress (Week 1): Met PT Short Term Goal 2 (Week 1): Pt will ambulate x 150 ft with LRAD and CGA PT Short Term Goal 2 - Progress (Week 1): Met PT Short Term Goal 3 (Week 1): Pt will navigate 6" stairs with min A PT Short Term Goal 3 - Progress (Week 1): Met Week 2:  PT Short Term Goal 1 (Week 2): =LTG due to ELOS  Skilled Therapeutic Interventions/Progress Updates:    pt received in bathroom with NCT and agreeable to therapy. Pt returned to bedside with RW with NCT, PT took over care of pt at this time. Pt directed in donning LSO brace in sitting at bedside supervision, pt donned TED, socks, and B shoes with use of reacher at min A total. Pt directed in gait training with RW for 250' CGA with VC for increased step length and height pt reported he felt very stiff and wanted to participate in something to "loosen up". Pt directed in NuStep for 8 mins L3 with use of BUE and BLE for improved BLE strengthening and ease of gait training. Pt reported this felt much better. Pt given HEP from PT and educated on each exercise on this with visual and verbal instructions from PT and on HEP, pt verbalized understanding of frequency, technique and reported no concerns. Pt directed in gait training with RW for 250' CGA reporting he felt much better and "more sturdy" with his mobility. Pt directed in doffing LSO at EOB and B shoes at supervision; sit>supine at [facility] and left in bed, alarm set, All needs in reach and in good condition. Call light in hand.    Therapy Documentation Precautions:  Precautions Precautions: Back, Fall Precaution Comments:  reviewed back precautions Required Braces or Orthoses: Spinal Brace Spinal Brace: Lumbar corset, Applied in sitting position Restrictions Weight Bearing Restrictions: No    Therapy/Group: Individual Therapy  Junie Panning 01/02/2020, 11:10 AM

## 2020-01-02 NOTE — Progress Notes (Signed)
[place] PHYSICAL MEDICINE & REHABILITATION PROGRESS NOTE   Subjective/Complaints: Slept better last night. Woke this morning with sensation of fullness in bilateral ears- Debrox drops ordered and applied by Edson Snowball RN Denies pain  ROS:   Pt denies SOB, abd pain, CP, N/V/C/D, and vision changes  Objective:   No results found. No results for input(s): WBC, HGB, HCT, PLT in the last 72 hours. No results for input(s): NA, K, CL, CO2, GLUCOSE, BUN, CREATININE, CALCIUM in the last 72 hours.  Intake/Output Summary (Last 24 hours) at 01/02/2020 1458 Last data filed at 01/02/2020 0900 Gross per 24 hour  Intake 480 ml  Output 300 ml  Net 180 ml     Physical Exam: Vital Signs Blood pressure 116/77, pulse 80, temperature 98 F (36.7 C), temperature source Oral, resp. rate 18, height 6\' 1"  (1.854 m), weight 56.4 kg, SpO2 100 %. General: Alert and oriented x 3, No apparent distress HEENT: Head is normocephalic, atraumatic, PERRLA, EOMI, sclera anicteric, oral mucosa pink and moist, dentition intact, ext ear canals clear,  Neck: Supple without JVD or lymphadenopathy Heart: Reg rate and rhythm. No murmurs rubs or gallops Chest: CTA bilaterally without wheezes, rales, or rhonchi; no distress Abdomen: Soft, non-tender, non-distended, bowel sounds positive. Extremities: No clubbing, cyanosis, or edema. Pulses are 2+ Skin: No evidence of breakdown, no evidence of rash Sensory able to distinguish LT in both feet- TTP over top of R foot- specifically on dorsum of R foot- less TTP today Neuro: Speech clear. Able to follow commands without difficulty. Significant back pain with attempts at RLE flexion/ext.  RLE 2-/5 strength on HF and KE, 3/5 DF and PF, LLE and upper extremities with 5/5 strength  Assessment/Plan: 1. Functional deficits secondary to cauda equina syndrome which require 3+ hours per day of interdisciplinary therapy in a comprehensive inpatient rehab setting.  Physiatrist is  providing close team supervision and 24 hour management of active medical problems listed below.  Physiatrist and rehab team continue to assess barriers to discharge/monitor patient progress toward functional and medical goals  Care Tool:  Bathing  Bathing activity did not occur: Refused Body parts bathed by patient: Right arm, Left arm, Chest, Abdomen, Front perineal area, Right upper leg, Left upper leg, Face, Buttocks, Right lower leg, Left lower leg         Bathing assist Assist Level: Supervision/Verbal cueing     Upper Body Dressing/Undressing Upper body dressing Upper body dressing/undressing activity did not occur (including orthotics): Refused What is the patient wearing?: Orthosis, Pull over shirt    Upper body assist Assist Level: Supervision/Verbal cueing    Lower Body Dressing/Undressing Lower body dressing    Lower body dressing activity did not occur: Refused What is the patient wearing?: Pants     Lower body assist Assist for lower body dressing: Supervision/Verbal cueing     Toileting Toileting    Toileting assist Assist for toileting: Supervision/Verbal cueing Assistive Device Comment:  (urinal)   Transfers Chair/bed transfer  Transfers assist     Chair/bed transfer assist level: Contact Guard/Touching assist     Locomotion Ambulation   Ambulation assist      Assist level: Contact Guard/Touching assist Assistive device: Walker-rolling Max distance: 300'   Walk 10 feet activity   Assist     Assist level: Contact Guard/Touching assist Assistive device: Walker-rolling   Walk 50 feet activity   Assist    Assist level: Contact Guard/Touching assist Assistive device: Walker-rolling    Walk 150 feet activity  Assist    Assist level: Contact Guard/Touching assist Assistive device: Walker-rolling    Walk 10 feet on uneven surface  activity   Assist Walk 10 feet on uneven surfaces activity did not occur: Safety/medical  concerns   Assist level: Minimal Assistance - Patient > 75% Assistive device: Photographer Will patient use wheelchair at discharge?: No             Wheelchair 50 feet with 2 turns activity    Assist            Wheelchair 150 feet activity     Assist          Blood pressure 116/77, pulse 80, temperature 98 F (36.7 C), temperature source Oral, resp. rate 18, height 6\' 1"  (1.854 m), weight 56.4 kg, SpO2 100 %.  Medical Problem List and Plan: 1.  Impaired mobility and ADLs secondary to cauda equina syndrome             -patient may shower but incision must be covered.  8/13- working on SSI/disability              -ELOS/Goals: 7-8 days modI  -Continue CIR PT, OT     2.  Antithrombotics: -DVT/anticoagulation:  Mechanical: Sequential compression devices, below knee Bilateral lower extremities Order dopplers for am- not done yet  8/9- pt at high risk for DVT- will check into dopplers and Lovenox- will call NSU             -antiplatelet therapy: N/A 3. Pain Management:  Oxycodone and flexeril prn. Add Oxycontin 10 mg bid for consistent pain relief--still looks in quite a bit of pain. Will add gabapentin tid for neuropathy also. Add kpad. Add baclofen 8/6 for muscle spasms.   8/9- c/o "overdoing it"- will see how does today and maybe make changes tomorrow  8/10- will add lidoderm patch on top of R foot 8am-8pm for nerve pain  8/11- was helpful for pain- when it was on- wants to continue  8/14: pain is well controlled 4. Mood: LCSW to follow for evaluation and support.              -antipsychotic agents: N/A 5. Neuropsych: This patient is capable of making decisions on his own behalf. 6. Skin/Wound Care: Spinal incision with honeycomb dressing. IV d/ced.   8/12- d/c honeycomb dressing and change dressing daily.  7. Fluids/Electrolytes/Nutrition: Monitor I/O. Add protein supplement for low calorie malnutrition. K+ 3.7 on 8/2.  Normal on  8/4  8. Leucocytosis: Likely reactive--17.1-->12.8. Normal on 8/4 9. ABLA: Hgb 9.2 on 8/2. 7.4 on 8/5. Iron level 10 on 8/5- started iron supplement . 8/9- Hb 7.8- back up slightly 10. Thrombocytopenia: Progressive--202-->112. No heparin or enoxaparin, recheck CBC in am   8/10- Plts 545k 11. Hyponatremia improved nl on 8/4  8/9- Na 136- resolved 12. Constipation: Schedule Miralax daily in am --effective.  Had good BM 8/5  8/12- having BMs daily per pt- at night 13. HTN: Monitor BP TID -- very well controlled.  Vitals:   01/02/20 0530 01/02/20 1345  BP: 112/72 116/77  Pulse: 78 80  Resp: 16 18  Temp: 98.1 F (36.7 C) 98 F (36.7 C)  SpO2: 100% 100%  8/13- BP controlled- con't meds 14. Hypomagnesemia:resolved  Mg level improved to 2.2 on 8/5  15. "freezing"  8/9- maybe overdid it- WBC nml- U/A (-)- doing better in afternoon- will monitor  8/10- feeling better today 16. Nerve  pain  8/11- lidoderm patch is helpful   8/14: well controlled 17. Bilateral ear fullness: Debrox ear drops ordered and administered.    LOS: 11 days A FACE TO FACE EVALUATION WAS PERFORMED  Drema Pry Lashun Mccants 01/02/2020, 2:58 PM

## 2020-01-02 NOTE — Progress Notes (Signed)
Occupational Therapy Session Note  Patient Details  Name: Colton Nichols MRN: [id number] Date of Birth: 1966-04-05  Today's Date: 01/02/2020 OT Group Time: 1115-1200 OT Group Time Calculation (min): 45 min 15 minutes missed due to nursing care  Skilled Therapeutic Interventions/Progress Updates:    Pt engaged in therapeutic w/c level dance group focusing on patient choice, UE/LE strengthening, salience, activity tolerance, and social participation. Pt was guided through various dance-based exercises involving UEs/LEs and trunk. All music was selected by group members. Emphasis placed on activity tolerance and standing balance while adhering to back precautions. Pt exhibited high levels of participation throughout group(!), interacting with others, requesting music, singing along to familiar songs, and standing with CGA using RW during "[place]." Pt very pleased about his ability to coordinate the Rt LE when stepping in beat to this song. Pt was taken back to room by RN at end of session.     Therapy Documentation Precautions:  Precautions Precautions: Back, Fall Precaution Comments: reviewed back precautions Required Braces or Orthoses: Spinal Brace Spinal Brace: Lumbar corset, Applied in sitting position Restrictions Weight Bearing Restrictions: No Pain: no s/s pain during tx    ADL: ADL Grooming: Setup Where Assessed-Grooming: Sitting at sink Lower Body Dressing: Supervision/safety (footwear only) Where Assessed-Lower Body Dressing: Wheelchair Toileting: Moderate assistance (simulated) Where Assessed-Toileting: Bedside Commode Toilet Transfer: Minimal assistance Toilet Transfer Equipment: Bedside commode, Grab bars (RW) Walk-In Shower Transfer: Insurance underwriter Method: Stand pivot, Designer, industrial/product: Shower seat with back      Therapy/Group: Group Therapy  Colton Nichols Colton Nichols 01/02/2020, 12:32 PM

## 2020-01-03 ENCOUNTER — Inpatient Hospital Stay (HOSPITAL_COMMUNITY): Payer: Self-pay | Admitting: Occupational Therapy

## 2020-01-03 NOTE — Progress Notes (Signed)
Occupational Therapy Session Note  Patient Details  Name: Colton Nichols MRN: [id number] Date of Birth: October 20, 1965  Today's Date: 01/03/2020 OT Individual Time: 7322-0254 OT Individual Time Calculation (min): 55 min   Short Term Goals: Week 2:  OT Short Term Goal 1 (Week 2): STG=LTG secondary to ELOS  Skilled Therapeutic Interventions/Progress Updates:    Pt greeted in bed, premedicated for Rt foot pain. Requesting to "exercise" vs participate in ADLs today. While EOB, pt donned his footwear, including Ted stockings with setup assistance for reacher and small piece of plastic bag. Setup for back brace as well. Using RW, pt ambulated to the outdoor patio area with supervision assist. Worked on higher level balance and dual task processing while crossing street to an area of grass, stepping up curb, and ambulating over uneven grassy terrain. Pt took 1 seated rest break and then ambulated over grass again, meeting the same task demands as stated above, CGA-close supervision for dynamic balance, pt very methodical about his stepping sequence. He then ambulated with RW back to his room with supervision, removed his footwear and then transitioned back to bed. Left him with all needs within reach and bed alarm set.   Therapy Documentation Precautions:  Precautions Precautions: Back, Fall Precaution Comments: reviewed back precautions Required Braces or Orthoses: Spinal Brace Spinal Brace: Lumbar corset, Applied in sitting position Restrictions Weight Bearing Restrictions: No Vital Signs: Therapy Vitals Temp: 98.2 F (36.8 C) Pulse Rate: 90 Resp: 17 BP: 137/87 Patient Position (if appropriate): Sitting Oxygen Therapy SpO2: 100 % O2 Device: Room Air ADL: ADL Grooming: Setup Where Assessed-Grooming: Sitting at sink Lower Body Dressing: Supervision/safety (footwear only) Where Assessed-Lower Body Dressing: Wheelchair Toileting: Moderate assistance (simulated) Where  Assessed-Toileting: Bedside Commode Toilet Transfer: Minimal assistance Toilet Transfer Equipment: Bedside commode, Grab bars (RW) Walk-In Shower Transfer: Insurance underwriter Method: Stand pivot, Designer, industrial/product: Shower seat with back    Therapy/Group: Individual Therapy  Khamora Karan A Loys Shugars 01/03/2020, 4:19 PM

## 2020-01-03 NOTE — Progress Notes (Signed)
[place] PHYSICAL MEDICINE & REHABILITATION PROGRESS NOTE   Subjective/Complaints: No issues overnight Ear fullness better. 1-2 BM per day. Still with shooting pain down right leg.  ROS:   Pt denies SOB, abd pain, CP, N/V/C/D, and vision changes  Objective:   No results found. No results for input(s): WBC, HGB, HCT, PLT in the last 72 hours. No results for input(s): NA, K, CL, CO2, GLUCOSE, BUN, CREATININE, CALCIUM in the last 72 hours.  Intake/Output Summary (Last 24 hours) at 01/03/2020 1023 Last data filed at 01/03/2020 0421 Gross per 24 hour  Intake 240 ml  Output 1200 ml  Net -960 ml     Physical Exam: Vital Signs Blood pressure 115/69, pulse 72, temperature 98.5 F (36.9 C), resp. rate 14, height 6\' 1"  (1.854 m), weight 56.4 kg, SpO2 100 %. General: Alert and oriented x 3, No apparent distress HEENT: Head is normocephalic, atraumatic, PERRLA, EOMI, sclera anicteric, oral mucosa pink and moist, dentition intact, ext ear canals clear,  Neck: Supple without JVD or lymphadenopathy Heart: Reg rate and rhythm. No murmurs rubs or gallops Chest: CTA bilaterally without wheezes, rales, or rhonchi; no distress Abdomen: Soft, non-tender, non-distended, bowel sounds positive. Extremities: No clubbing, cyanosis, or edema. Pulses are 2+ Skin: No evidence of breakdown, no evidence of rash Sensory able to distinguish LT in both feet- TTP over top of R foot- specifically on dorsum of R foot- less TTP today Neuro: Speech clear. Able to follow commands without difficulty. Significant back pain with attempts at RLE flexion/ext.  RLE 2-/5 strength on HF and KE, 3/5 DF and PF, LLE and upper extremities with 5/5 strength   Assessment/Plan: 1. Functional deficits secondary to cauda equina syndrome which require 3+ hours per day of interdisciplinary therapy in a comprehensive inpatient rehab setting.  Physiatrist is providing close team supervision and 24 hour management of active medical  problems listed below.  Physiatrist and rehab team continue to assess barriers to discharge/monitor patient progress toward functional and medical goals  Care Tool:  Bathing  Bathing activity did not occur: Refused Body parts bathed by patient: Right arm, Left arm, Chest, Abdomen, Front perineal area, Right upper leg, Left upper leg, Face, Buttocks, Right lower leg, Left lower leg         Bathing assist Assist Level: Supervision/Verbal cueing     Upper Body Dressing/Undressing Upper body dressing Upper body dressing/undressing activity did not occur (including orthotics): Refused What is the patient wearing?: Orthosis, Pull over shirt    Upper body assist Assist Level: Supervision/Verbal cueing    Lower Body Dressing/Undressing Lower body dressing    Lower body dressing activity did not occur: Refused What is the patient wearing?: Pants     Lower body assist Assist for lower body dressing: Supervision/Verbal cueing     Toileting Toileting    Toileting assist Assist for toileting: Supervision/Verbal cueing Assistive Device Comment:  (urinal)   Transfers Chair/bed transfer  Transfers assist     Chair/bed transfer assist level: Contact Guard/Touching assist     Locomotion Ambulation   Ambulation assist      Assist level: Contact Guard/Touching assist Assistive device: Walker-rolling Max distance: 300'   Walk 10 feet activity   Assist     Assist level: Contact Guard/Touching assist Assistive device: Walker-rolling   Walk 50 feet activity   Assist    Assist level: Contact Guard/Touching assist Assistive device: Walker-rolling    Walk 150 feet activity   Assist    Assist level: Contact  Guard/Touching assist Assistive device: Walker-rolling    Walk 10 feet on uneven surface  activity   Assist Walk 10 feet on uneven surfaces activity did not occur: Safety/medical concerns   Assist level: Minimal Assistance - Patient > 75% Assistive  device: Photographer Will patient use wheelchair at discharge?: No             Wheelchair 50 feet with 2 turns activity    Assist            Wheelchair 150 feet activity     Assist          Blood pressure 115/69, pulse 72, temperature 98.5 F (36.9 C), resp. rate 14, height 6\' 1"  (1.854 m), weight 56.4 kg, SpO2 100 %.  Medical Problem List and Plan: 1.  Impaired mobility and ADLs secondary to cauda equina syndrome             -patient may shower but incision must be covered.  8/13- working on SSI/disability              -ELOS/Goals: 7-8 days modI  -Continue CIR PT, OT     2.  Antithrombotics: -DVT/anticoagulation:  Mechanical: Sequential compression devices, below knee Bilateral lower extremities Order dopplers for am- not done yet  8/9- pt at high risk for DVT- will check into dopplers and Lovenox- will call NSU             -antiplatelet therapy: N/A 3. Pain Management:  Oxycodone and flexeril prn. Add Oxycontin 10 mg bid for consistent pain relief--still looks in quite a bit of pain. Will add gabapentin tid for neuropathy also. Add kpad. Add baclofen 8/6 for muscle spasms.   8/9- c/o "overdoing it"- will see how does today and maybe make changes tomorrow  8/10- will add lidoderm patch on top of R foot 8am-8pm for nerve pain  8/11- was helpful for pain- when it was on- wants to continue  8/15: pain is relatively well controlled 4. Mood: LCSW to follow for evaluation and support.              -antipsychotic agents: N/A 5. Neuropsych: This patient is capable of making decisions on his own behalf. 6. Skin/Wound Care: Spinal incision with honeycomb dressing. IV d/ced.   8/12- d/c honeycomb dressing and change dressing daily.  7. Fluids/Electrolytes/Nutrition: Monitor I/O. Add protein supplement for low calorie malnutrition. K+ 3.7 on 8/2.  Normal on 8/4  8. Leucocytosis: Likely reactive--17.1-->12.8. Normal on 8/4 9. ABLA: Hgb 9.2  on 8/2. 7.4 on 8/5. Iron level 10 on 8/5- started iron supplement . 8/9- Hb 7.8- back up slightly 10. Thrombocytopenia: Progressive--202-->112. No heparin or enoxaparin, recheck CBC in am   8/10- Plts 545k 11. Hyponatremia improved nl on 8/4  8/9- Na 136- resolved 12. Constipation: Schedule Miralax daily in am --effective.  Had good BM 8/5  8/15: 1-2 BM per day 13. HTN: Monitor BP TID -- very well controlled.  Vitals:   01/02/20 1958 01/03/20 0413  BP: 117/73 115/69  Pulse: 83 72  Resp: 14 14  Temp: 99.1 F (37.3 C) 98.5 F (36.9 C)  SpO2: 100% 100%  8/15: BP well controlled 14. Hypomagnesemia:resolved  Mg level improved to 2.2 on 8/5  15. "freezing"  8/9- maybe overdid it- WBC nml- U/A (-)- doing better in afternoon- will monitor  8/10- feeling better today 16. Nerve pain  8/11- lidoderm patch is helpful   8/14: well controlled 17.  Bilateral ear fullness: Debrox ear drops ordered and administered. Improved.    LOS: 12 days A FACE TO FACE EVALUATION WAS PERFORMED  Drema Pry Jeff Mccallum 01/03/2020, 10:23 AM

## 2020-01-04 ENCOUNTER — Inpatient Hospital Stay (HOSPITAL_COMMUNITY): Payer: Self-pay

## 2020-01-04 ENCOUNTER — Inpatient Hospital Stay (HOSPITAL_COMMUNITY): Payer: Self-pay | Admitting: Physical Therapy

## 2020-01-04 LAB — BASIC METABOLIC PANEL
Anion gap: 7 (ref 5–15)
BUN: 10 mg/dL (ref 6–20)
CO2: 28 mmol/L (ref 22–32)
Calcium: 9.3 mg/dL (ref 8.9–10.3)
Chloride: 102 mmol/L (ref 98–111)
Creatinine, Ser: 1 mg/dL (ref 0.61–1.24)
GFR calc Af Amer: >60 mL/min (ref >60)
GFR calc non Af Amer: >60 mL/min (ref >60)
Glucose, Bld: 94 mg/dL (ref 70–99)
Potassium: 4.3 mmol/L (ref 3.5–5.1)
Sodium: 137 mmol/L (ref 135–145)

## 2020-01-04 MED ORDER — ENOXAPARIN SODIUM 40 MG/0.4ML ~~LOC~~ SOLN
40.0000 mg | SUBCUTANEOUS | Status: DC
  Administered 2020-01-04 – 2020-01-06 (×3): 40 mg via SUBCUTANEOUS
  Filled 2020-01-04 (×3): qty 0.4

## 2020-01-04 NOTE — Plan of Care (Signed)
  Problem: SCI BOWEL ELIMINATION Goal: RH STG MANAGE BOWEL WITH ASSISTANCE Description: STG Manage Bowel with mod I Assistance. Outcome: Progressing   Problem: SCI BLADDER ELIMINATION Goal: RH STG MANAGE BLADDER WITH ASSISTANCE Description: STG Manage Bladder With mod I Assistance Outcome: Progressing   Problem: RH SKIN INTEGRITY Goal: RH STG SKIN FREE OF INFECTION/BREAKDOWN Description: Remain free of infection/further breakdown during rehab stay  Outcome: Progressing Goal: RH STG MAINTAIN SKIN INTEGRITY WITH ASSISTANCE Description: STG Maintain Skin Integrity With min Assistance. Outcome: Progressing   Problem: RH SAFETY Goal: RH STG ADHERE TO SAFETY PRECAUTIONS W/ASSISTANCE/DEVICE Description: STG Adhere to Safety Precautions With min Assistance/Device. Outcome: Progressing Goal: RH STG DECREASED RISK OF FALL WITH ASSISTANCE Description: STG Decreased Risk of Fall With min Assistance. Outcome: Progressing   Problem: RH PAIN MANAGEMENT Goal: RH STG PAIN MANAGED AT OR BELOW PT'S PAIN GOAL Description: Pain </= 4 Outcome: Progressing   Problem: RH KNOWLEDGE DEFICIT SCI Goal: RH STG INCREASE KNOWLEDGE OF SELF CARE AFTER SCI Outcome: Progressing   Problem: Consults Goal: RH SPINAL CORD INJURY PATIENT EDUCATION Description:  See Patient Education module for education specifics.  Outcome: Progressing   

## 2020-01-04 NOTE — Progress Notes (Signed)
Occupational Therapy Session Note  Patient Details  Name: Colton Nichols MRN: [id number] Date of Birth: 05-16-1966  Today's Date: 01/04/2020 OT Individual Time: 1300-1413 OT Individual Time Calculation (min): 73 min    Short Term Goals: Week 1:  OT Short Term Goal 1 (Week 1): Pt will transfer to toilet wiht S overall OT Short Term Goal 1 - Progress (Week 1): Met OT Short Term Goal 2 (Week 1): Pt will complete 3/3 toileting components with CGA OT Short Term Goal 2 - Progress (Week 1): Met OT Short Term Goal 3 (Week 1): Pt will recall 3/3 back precautions OT Short Term Goal 3 - Progress (Week 1): Met OT Short Term Goal 4 (Week 1): Pt will footwear wiht AE PRN at S level OT Short Term Goal 4 - Progress (Week 1): Met Week 2:  OT Short Term Goal 1 (Week 2): STG=LTG secondary to ELOS  Skilled Therapeutic Interventions/Progress Updates:    OT intervention with focus on functional amb with RW, bathing at tub/shower level, dressing with sit<>stand from seat, activity tolerance, safety awareness, and discharge planning in preparation for d/c on 8/19. Pt completed all tasks at supervision level with no unsafe behaviors noted.  Pt adheres to back precautions independently in functional tasks. Functional amb with RW at supervision level. Discussed home setup and bathroom safety after shower. Pt returned to room and sat EOB.  Pt requires more then a reasonable amount of time to complete tasks. Pt remained in bed with all needs within reach and bed alarm activated.   Therapy Documentation Precautions:  Precautions Precautions: Back, Fall Precaution Comments: reviewed back precautions Required Braces or Orthoses: Spinal Brace Spinal Brace: Lumbar corset, Applied in sitting position Restrictions Weight Bearing Restrictions: No Pain:  Pt c/o R foot neuropathic pain; RN applied Lidocaine patch    Therapy/Group: Individual Therapy  Leroy Libman 01/04/2020, 2:19 PM

## 2020-01-04 NOTE — Progress Notes (Signed)
Physical Therapy Session Note  Patient Details  Name: Colton Nichols MRN: [id number] Date of Birth: [birth year]  Today's Date: 01/04/2020 PT Individual Time: 8299-3716; 1000-1100 PT Individual Time Calculation (min): 55 min and 60 min  Short Term Goals: Week 2:  PT Short Term Goal 1 (Week 2): =LTG due to ELOS  Skilled Therapeutic Interventions/Progress Updates:    Session 1: Pt received seated in bed, agreeable to PT session. No complaints of pain this AM. Pt reports sleeping well due to wearing SCDs which helped with soreness he has in his LE overnight. Pt reporting urgent need to have a BM. Pt is at Supervision level for bed mobility, setup A to don lumbar corset, and Supervision for transfers with RW. Ambulation into bathroom with RW and Supervision. Pt is independent for all clothing management and pericare following continent BM. Pt returned to sitting on side of bed and is setup A for donning knee-high TEDs, socks, and shoes with use of AE. Ambulation 2 x 200 ft with RW at Supervision level. Pt initially with flexed trunk posture during gait due to reports of "tightness" in hips and low back. Nustep level 4 x 7 min with use of B UE/LE for global endurance training. Pt reports decrease in "tightness" following Nustep exercise. Pt returned to bed at end of session with Supervision. Pt left semi-reclined in bed setup for breakfast with needs in reach, bed alarm in place at end of session.  Session 2: Pt received seated in bed, agreeable to PT session. Pt is Supervision for bed mobility, setup A to don lumbar corset and shoes while seated EOB with use of reacher. Sit to stand with Supervision to RW throughout session from various height surfaces. Ambulation up to 200 ft with RW at Supervision level during session. Ascend/descend 12 x 6" stairs with 2 handrails and Supervision with cues for step-to gait pattern. Discussed RW management and having family set up RW on porch at top of stairs so pt  can use RW to enter his home. Pt requesting to do laundry, pt is able to gather clothing items in room and maintain standing balance while starting laundry in washer with use of RW at Supervision level. Sit to/from supine on real bed at 26" height at Supervision level with cues for log roll technique and adhering to back precautions. Pt requests to return to hospital bed at end of session. Pt left semi-reclined in bed with needs in reach, bed alarm in place at end of session.  Therapy Documentation Precautions:  Precautions Precautions: Back, Fall Precaution Comments: reviewed back precautions Required Braces or Orthoses: Spinal Brace Spinal Brace: Lumbar corset, Applied in sitting position Restrictions Weight Bearing Restrictions: No   Therapy/Group: Individual Therapy   Peter Congo, PT, DPT  01/04/2020, 12:13 PM

## 2020-01-04 NOTE — Progress Notes (Signed)
Patient ID: Colton Nichols, male   DOB: 06-01-1965, 54 y.o.   MRN: [id number]   8/13-SW spoke with Elie Confer [employer] who reported pt appropriate for Medicaid per screening Sydnee Cabal working on this), and referral will be sent to [employer] for [employer]. SW informed pt has scheduled his phone interview already.   8/16- SW met with pt in room to follow-up on above. SW also discussed with pt if he was able to get updates on DME he has at home. Reports he intends to speak with his mother today since she arrived home late last night, and had to work today. SW to follow-up with pt tomorrow.    Loralee Pacas, MSW, [place] Office: [phone] Cell: [phone] Fax: [phone]

## 2020-01-04 NOTE — Progress Notes (Signed)
[facility] PHYSICAL MEDICINE & REHABILITATION PROGRESS NOTE   Subjective/Complaints:  Pt reports he's frustrated because gave [employer] the wrong email- now needs to call back and was on phone 3.5 hours last time.   Didn't sleep well after last night- this AM- really groggy since was so out of it overnight.   Bowels going OK-  Likes SCDs- they help his nerve pain of his legs.   ROS:   Pt denies SOB, abd pain, CP, N/V/C/D, and vision changes   Objective:   No results found. No results for input(s): WBC, HGB, HCT, PLT in the last 72 hours. Recent Labs    01/04/20 0506  NA 137  K 4.3  CL 102  CO2 28  GLUCOSE 94  BUN 10  CREATININE 1.00  CALCIUM 9.3    Intake/Output Summary (Last 24 hours) at 01/04/2020 1420 Last data filed at 01/04/2020 0900 Gross per 24 hour  Intake 580 ml  Output 1700 ml  Net -1120 ml     Physical Exam: Vital Signs Blood pressure 123/83, pulse 89, temperature 98.7 F (37.1 C), resp. rate 16, height 6\' 1"  (1.854 m), weight 56.7 kg, SpO2 100 %. General: alert, appropriate, laying in bed, NAD HEENT: conjugate gaze Neck: Supple without JVD or lymphadenopathy Heart: RRR Chest: CTA B/L- no W/R/R- good air movement Abdomen: Soft, NT, ND, (+)BS  Extremities: No clubbing, cyanosis, or edema. Pulses are 2+ Skin: No evidence of breakdown, no evidence of rash Sensory able to distinguish LT in both feet- TTP over top of R foot- specifically on dorsum of R foot- less TTP today Neuro: Speech clear. Able to follow commands without difficulty. Significant back pain with attempts at RLE flexion/ext.  RLE 2-/5 strength on HF and KE, 3/5 DF and PF, LLE and upper extremities with 5/5 strength   Assessment/Plan: 1. Functional deficits secondary to cauda equina syndrome which require 3+ hours per day of interdisciplinary therapy in a comprehensive inpatient rehab setting.  Physiatrist is providing close team supervision and 24 hour management of active medical problems  listed below.  Physiatrist and rehab team continue to assess barriers to discharge/monitor patient progress toward functional and medical goals  Care Tool:  Bathing  Bathing activity did not occur: Refused Body parts bathed by patient: Right arm, Left arm, Chest, Abdomen, Front perineal area, Right upper leg, Left upper leg, Face, Buttocks, Right lower leg, Left lower leg         Bathing assist Assist Level: Supervision/Verbal cueing     Upper Body Dressing/Undressing Upper body dressing Upper body dressing/undressing activity did not occur (including orthotics): Refused What is the patient wearing?: Orthosis, Pull over shirt    Upper body assist Assist Level: Supervision/Verbal cueing    Lower Body Dressing/Undressing Lower body dressing    Lower body dressing activity did not occur: Refused What is the patient wearing?: Pants     Lower body assist Assist for lower body dressing: Supervision/Verbal cueing     Toileting Toileting    Toileting assist Assist for toileting: Supervision/Verbal cueing Assistive Device Comment:  (urinal)   Transfers Chair/bed transfer  Transfers assist     Chair/bed transfer assist level: Supervision/Verbal cueing     Locomotion Ambulation   Ambulation assist      Assist level: Supervision/Verbal cueing Assistive device: Walker-rolling Max distance: 200'   Walk 10 feet activity   Assist     Assist level: Supervision/Verbal cueing Assistive device: Walker-rolling   Walk 50 feet activity   Assist  Assist level: Supervision/Verbal cueing Assistive device: Walker-rolling    Walk 150 feet activity   Assist    Assist level: Supervision/Verbal cueing Assistive device: Walker-rolling    Walk 10 feet on uneven surface  activity   Assist Walk 10 feet on uneven surfaces activity did not occur: Safety/medical concerns   Assist level: Supervision/Verbal cueing Assistive device: [employer] manager Will patient use wheelchair at discharge?: No             Wheelchair 50 feet with 2 turns activity    Assist            Wheelchair 150 feet activity     Assist          Blood pressure 123/83, pulse 89, temperature 98.7 F (37.1 C), resp. rate 16, height 6\' 1"  (1.854 m), weight 56.7 kg, SpO2 100 %.  Medical Problem List and Plan: 1.  Impaired mobility and ADLs secondary to cauda equina syndrome             -patient may shower but incision must be covered.  8/13- working on SSI/disability  8/16- needs to speak to SSI again- gave wrong email.               -ELOS/Goals: 7-8 days modI  -Continue CIR PT, OT     2.  Antithrombotics: -DVT/anticoagulation:  Mechanical: Sequential compression devices, below knee Bilateral lower extremities Order dopplers for am- not done yet  8/9- pt at high risk for DVT- will check into dopplers and Lovenox- will call NSU  8/16- we called- didn't hear back- it's been >10 days- will start 40 mg daily             -antiplatelet therapy: N/A 3. Pain Management:  Oxycodone and flexeril prn. Add Oxycontin 10 mg bid for consistent pain relief--still looks in quite a bit of pain. Will add gabapentin tid for neuropathy also. Add kpad. Add baclofen 8/6 for muscle spasms.   8/9- c/o "overdoing it"- will see how does today and maybe make changes tomorrow  8/10- will add lidoderm patch on top of R foot 8am-8pm for nerve pain  8/11- was helpful for pain- when it was on- wants to continue  8/15: pain is relatively well controlled  8/16- SCDs help nerve pain in legs 4. Mood: LCSW to follow for evaluation and support.              -antipsychotic agents: N/A 5. Neuropsych: This patient is capable of making decisions on his own behalf. 6. Skin/Wound Care: Spinal incision with honeycomb dressing. IV d/ced.   8/12- d/c honeycomb dressing and change dressing daily.  7. Fluids/Electrolytes/Nutrition: Monitor I/O. Add protein  supplement for low calorie malnutrition. K+ 3.7 on 8/2.  Normal on 8/4  8. Leucocytosis: Likely reactive--17.1-->12.8. Normal on 8/4 9. ABLA: Hgb 9.2 on 8/2. 7.4 on 8/5. Iron level 10 on 8/5- started iron supplement . 8/9- Hb 7.8- back up slightly 10. Thrombocytopenia: Progressive--202-->112. No heparin or enoxaparin, recheck CBC in am   8/10- Plts 545k 11. Hyponatremia improved nl on 8/4  8/9- Na 136- resolved 12. Constipation: Schedule Miralax daily in am --effective.  Had good BM 8/5  8/15: 1-2 BM per day 13. HTN: Monitor BP TID -- very well controlled.  Vitals:   01/04/20 0317 01/04/20 1258  BP: 114/74 123/83  Pulse: 72 89  Resp: 18 16  Temp: 98.1 F (36.7 C) 98.7 F (37.1 C)  SpO2: 100%  100%  8/16- BP controlled- con't regimen 14. Hypomagnesemia:resolved  Mg level improved to 2.2 on 8/5  15. "freezing"  8/9- maybe overdid it- WBC nml- U/A (-)- doing better in afternoon- will monitor  8/10- feeling better today 16. Nerve pain  8/11- lidoderm patch is helpful   8/14: well controlled  8/16- SCDs help as well.  17. Bilateral ear fullness: Debrox ear drops ordered and administered. Improved.    LOS: 13 days A FACE TO FACE EVALUATION WAS PERFORMED  Lonetta Blassingame 01/04/2020, 2:20 PM

## 2020-01-05 ENCOUNTER — Inpatient Hospital Stay (HOSPITAL_COMMUNITY): Payer: Self-pay

## 2020-01-05 ENCOUNTER — Inpatient Hospital Stay (HOSPITAL_COMMUNITY): Payer: Self-pay | Admitting: Physical Therapy

## 2020-01-05 LAB — CBC WITH DIFFERENTIAL/PLATELET
Abs Immature Granulocytes: 0.02 10*3/uL (ref 0.00–0.07)
Basophils Absolute: 0.1 10*3/uL (ref 0.0–0.1)
Basophils Relative: 1 %
Eosinophils Absolute: 0.2 10*3/uL (ref 0.0–0.5)
Eosinophils Relative: 3 %
HCT: 28.8 % — ABNORMAL LOW (ref 39.0–52.0)
Hemoglobin: 9.2 g/dL — ABNORMAL LOW (ref 13.0–17.0)
Immature Granulocytes: 0 %
Lymphocytes Relative: 51 %
Lymphs Abs: 3.8 10*3/uL (ref 0.7–4.0)
MCH: 32.6 pg (ref 26.0–34.0)
MCHC: 31.9 g/dL (ref 30.0–36.0)
MCV: 102.1 fL — ABNORMAL HIGH (ref 80.0–100.0)
Monocytes Absolute: 0.7 10*3/uL (ref 0.1–1.0)
Monocytes Relative: 10 %
Neutro Abs: 2.6 10*3/uL (ref 1.7–7.7)
Neutrophils Relative %: 35 %
Platelets: 535 10*3/uL — ABNORMAL HIGH (ref 150–400)
RBC: 2.82 MIL/uL — ABNORMAL LOW (ref 4.22–5.81)
RDW: 13.7 % (ref 11.5–15.5)
WBC: 7.5 10*3/uL (ref 4.0–10.5)
nRBC: 0 % (ref 0.0–0.2)

## 2020-01-05 NOTE — Progress Notes (Addendum)
Patient ID: Colton Nichols, male   DOB: 12-07-1965, 54 y.o.   MRN: [id number]  Per medical team, pt will require HHPT if able to get through charity or OPT PT. SW sent HHPT charity referral to [facility].   SW called pt in room to get updates on DME. Reports mother was only able to find RW and cane. SW to order DME: shower chair and 3in1 BSC. SW explained charity process, and informed on above about [facility] as well. SW also discussed with pt [employer] Rx assistance program. Pt reports that he does not currently have funds to purchase, but would like to select a pharmacy to pick up items. SW explained Rx is only good for 7 days. SW to provide [facility] letter.   SW ordered DME: shower chair and 3in1 BSC with [employer] via parachute.   Cecile Sheerer, MSW, LCSWA Office: [phone] Cell: [phone] Fax: [phone]

## 2020-01-05 NOTE — Plan of Care (Signed)
  Problem: SCI BOWEL ELIMINATION Goal: RH STG MANAGE BOWEL WITH ASSISTANCE Description: STG Manage Bowel with mod I Assistance. Outcome: Progressing   Problem: SCI BOWEL ELIMINATION Goal: RH STG MANAGE BOWEL WITH ASSISTANCE Description: STG Manage Bowel with mod I Assistance. Outcome: Progressing   Problem: SCI BLADDER ELIMINATION Goal: RH STG MANAGE BLADDER WITH ASSISTANCE Description: STG Manage Bladder With mod I Assistance Outcome: Progressing   Problem: RH SKIN INTEGRITY Goal: RH STG SKIN FREE OF INFECTION/BREAKDOWN Description: Remain free of infection/further breakdown during rehab stay  Outcome: Progressing Goal: RH STG MAINTAIN SKIN INTEGRITY WITH ASSISTANCE Description: STG Maintain Skin Integrity With min Assistance. Outcome: Progressing

## 2020-01-05 NOTE — Progress Notes (Signed)
Occupational Therapy Session Note  Patient Details  Name: Colton Nichols MRN: [id number] Date of Birth: 12/12/1965  Today's Date: 01/05/2020 OT Individual Time: 0900-1000 OT Individual Time Calculation (min): 60 min    Short Term Goals: Week 2:  OT Short Term Goal 1 (Week 2): STG=LTG secondary to ELOS  Skilled Therapeutic Interventions/Progress Updates:    Pt resting in bed upon arrival.  OT intervention with focus on donning Ted Hose/socks/shoes using AE PRN. Pt adheres to all back precautions independently. Pt amb with RW to Day Room and initially engaged in BLE therex on NuStep (level 5 for 7 mins). Pt amb with RW to ADL apartment and engaged in simple home mgmt tasks with RW. Pt issued walker bag and educated on use. Pt practiced retrieving items from kitchen cabines (high and low) while adhering to back precautions.  No unsafe behavior noted. Pt returned to room and remained seated EOB with bed alarm activated and all needs within reach.   Therapy Documentation Precautions:  Precautions Precautions: Back, Fall Precaution Comments: reviewed back precautions Required Braces or Orthoses: Spinal Brace Spinal Brace: Lumbar corset, Applied in sitting position Restrictions Weight Bearing Restrictions: No   Pain:  Pt c/o RLE/foot pain; RN aware (Lidocaine patch currently placed on foot)   Therapy/Group: Individual Therapy  Rich Brave 01/05/2020, 12:07 PM

## 2020-01-05 NOTE — Progress Notes (Signed)
[facility] PHYSICAL MEDICINE & REHABILITATION PROGRESS NOTE   Subjective/Complaints:  Pt reports attempted to reach SSI/etc yesterday- but had to go to therapy every time was on hold.   Sleepy this AM- slept soundly and somewhat sore, but "average".    ROS:   Pt denies SOB, abd pain, CP, N/V/C/D, and vision changes  Objective:   No results found. Recent Labs    01/05/20 0511  WBC 7.5  HGB 9.2*  HCT 28.8*  PLT 535*   Recent Labs    01/04/20 0506  NA 137  K 4.3  CL 102  CO2 28  GLUCOSE 94  BUN 10  CREATININE 1.00  CALCIUM 9.3    Intake/Output Summary (Last 24 hours) at 01/05/2020 0854 Last data filed at 01/05/2020 0850 Gross per 24 hour  Intake 1122 ml  Output 1200 ml  Net -78 ml     Physical Exam: Vital Signs Blood pressure 119/72, pulse 70, temperature 98 F (36.7 C), resp. rate 18, height 6\' 1"  (1.854 m), weight 56.7 kg, SpO2 100 %. General: alert, appropriate, asleep initially, appropriate, NAD, supine in bed HEENT: conjugate gaze Neck: Supple without JVD or lymphadenopathy Heart: RRR Chest: CTA B/L- no W/R/R- good air movement Abdomen: Soft, NT, ND, (+)BS  Extremities: No clubbing, cyanosis, or edema. Pulses are 2+ Skin: No evidence of breakdown, no evidence of rash Sensory able to distinguish LT in both feet- TTP over top of R foot- specifically on dorsum of R foot- less TTP today Neuro: Speech clear. Able to follow commands without difficulty. Significant back pain with attempts at RLE flexion/ext.  RLE 2-/5 strength on HF and KE, 3/5 DF and PF, LLE and upper extremities with 5/5 strength -didn't test formally today, but was stretching and lifted both legs against gravity.    Assessment/Plan: 1. Functional deficits secondary to cauda equina syndrome which require 3+ hours per day of interdisciplinary therapy in a comprehensive inpatient rehab setting.  Physiatrist is providing close team supervision and 24 hour management of active medical  problems listed below.  Physiatrist and rehab team continue to assess barriers to discharge/monitor patient progress toward functional and medical goals  Care Tool:  Bathing  Bathing activity did not occur: Refused Body parts bathed by patient: Right arm, Left arm, Chest, Abdomen, Front perineal area, Right upper leg, Left upper leg, Face, Buttocks, Right lower leg, Left lower leg         Bathing assist Assist Level: Supervision/Verbal cueing     Upper Body Dressing/Undressing Upper body dressing Upper body dressing/undressing activity did not occur (including orthotics): Refused What is the patient wearing?: Orthosis, Pull over shirt    Upper body assist Assist Level: Supervision/Verbal cueing    Lower Body Dressing/Undressing Lower body dressing    Lower body dressing activity did not occur: Refused What is the patient wearing?: Pants     Lower body assist Assist for lower body dressing: Supervision/Verbal cueing     Toileting Toileting    Toileting assist Assist for toileting: Supervision/Verbal cueing Assistive Device Comment:  (urinal)   Transfers Chair/bed transfer  Transfers assist     Chair/bed transfer assist level: Supervision/Verbal cueing     Locomotion Ambulation   Ambulation assist      Assist level: Supervision/Verbal cueing Assistive device: Walker-rolling Max distance: 200'   Walk 10 feet activity   Assist     Assist level: Supervision/Verbal cueing Assistive device: Walker-rolling   Walk 50 feet activity   Assist    Assist  level: Supervision/Verbal cueing Assistive device: Walker-rolling    Walk 150 feet activity   Assist    Assist level: Supervision/Verbal cueing Assistive device: Walker-rolling    Walk 10 feet on uneven surface  activity   Assist Walk 10 feet on uneven surfaces activity did not occur: Safety/medical concerns   Assist level: Supervision/Verbal cueing Assistive device: [employer] manager Will patient use wheelchair at discharge?: No             Wheelchair 50 feet with 2 turns activity    Assist            Wheelchair 150 feet activity     Assist          Blood pressure 119/72, pulse 70, temperature 98 F (36.7 C), resp. rate 18, height 6\' 1"  (1.854 m), weight 56.7 kg, SpO2 100 %.  Medical Problem List and Plan: 1.  Impaired mobility and ADLs secondary to cauda equina syndrome             -patient may shower but incision must be covered.  8/13- working on SSI/disability  8/16- needs to speak to SSI again- gave wrong email.    8/17- still trying to reach them             -ELOS/Goals: 7-8 days modI  -Continue CIR PT, OT     2.  Antithrombotics: -DVT/anticoagulation:  Mechanical: Sequential compression devices, below knee Bilateral lower extremities Order dopplers for am- not done yet  8/9- pt at high risk for DVT- will check into dopplers and Lovenox- will call NSU  8/16- we called- didn't hear back- it's been >10 days- will start 40 mg daily             -antiplatelet therapy: N/A 3. Pain Management:  Oxycodone and flexeril prn. Add Oxycontin 10 mg bid for consistent pain relief--still looks in quite a bit of pain. Will add gabapentin tid for neuropathy also. Add kpad. Add baclofen 8/6 for muscle spasms.   8/9- c/o "overdoing it"- will see how does today and maybe make changes tomorrow  8/10- will add lidoderm patch on top of R foot 8am-8pm for nerve pain  8/11- was helpful for pain- when it was on- wants to continue  8/15: pain is relatively well controlled  8/16- SCDs help nerve pain in legs 4. Mood: LCSW to follow for evaluation and support.              -antipsychotic agents: N/A 5. Neuropsych: This patient is capable of making decisions on his own behalf. 6. Skin/Wound Care: Spinal incision with honeycomb dressing. IV d/ced.   8/12- d/c honeycomb dressing and change dressing daily.  7.  Fluids/Electrolytes/Nutrition: Monitor I/O. Add protein supplement for low calorie malnutrition. K+ 3.7 on 8/2.  Normal on 8/4  8. Leucocytosis: Likely reactive--17.1-->12.8. Normal on 8/4  8/17- normal 9. ABLA: Hgb 9.2 on 8/2. 7.4 on 8/5. Iron level 10 on 8/5- started iron supplement . 8/9- Hb 7.8- back up slightly  8/17- Hb 9.2 this AM 10. Thrombocytopenia: Progressive--202-->112. No heparin or enoxaparin, recheck CBC in am   8/10- Plts 545k  8/17- Plts 535k- con't regimen 11. Hyponatremia improved nl on 8/4  8/9- Na 136- resolved 12. Constipation: Schedule Miralax daily in am --effective.  Had good BM 8/5  8/15: 1-2 BM per day 13. HTN: Monitor BP TID -- very well controlled.  Vitals:   01/04/20 1936 01/05/20 0548  BP: 124/76 119/72  Pulse: 83 70  Resp: 16 18  Temp: 98.9 F (37.2 C) 98 F (36.7 C)  SpO2: 100% 100%  8/17- BP controlled- con't regimen 14. Hypomagnesemia:resolved  Mg level improved to 2.2 on 8/5  15. "freezing"  8/9- maybe overdid it- WBC nml- U/A (-)- doing better in afternoon- will monitor  8/10- feeling better today 16. Nerve pain  8/11- lidoderm patch is helpful   8/14: well controlled  8/16- SCDs help as well.  17. Bilateral ear fullness: Debrox ear drops ordered and administered. Improved.    LOS: 14 days A FACE TO FACE EVALUATION WAS PERFORMED  Presleigh Feldstein 01/05/2020, 8:54 AM

## 2020-01-05 NOTE — Progress Notes (Signed)
Pt educated on administration of Lovenox. Techniques for cleaning site and injection demonstrated. Educated to only give in abdomen and not to remove air bubble prior to administration.

## 2020-01-05 NOTE — Progress Notes (Signed)
Occupational Therapy Session Note  Patient Details  Name: Colton Nichols MRN: [id number] Date of Birth: 1966-04-16  Today's Date: 01/05/2020 OT Individual Time: 1300-1355 OT Individual Time Calculation (min): 55 min    Short Term Goals: Week 1:  OT Short Term Goal 1 (Week 1): Pt will transfer to toilet wiht S overall OT Short Term Goal 1 - Progress (Week 1): Met OT Short Term Goal 2 (Week 1): Pt will complete 3/3 toileting components with CGA OT Short Term Goal 2 - Progress (Week 1): Met OT Short Term Goal 3 (Week 1): Pt will recall 3/3 back precautions OT Short Term Goal 3 - Progress (Week 1): Met OT Short Term Goal 4 (Week 1): Pt will footwear wiht AE PRN at S level OT Short Term Goal 4 - Progress (Week 1): Met  Skilled Therapeutic Interventions/Progress Updates:    OT intervention with focus on functional amb with RW, dynamic standing balance, sit<>stand, and safety awareness to increase independence with BADLs. Pt amb with RW to Day Room and engaged in Wii bowling and stepping forward with RLE during swing phase of bowling.  Pt completed game while standing with no LOB and no UE support. Pt amb with RW to ortho gym and attempted sit<>stand from elevated surface without UE support.  Pt unable to complete sit<>stand without UE assist. Pt engaged in [employer] activity with no UE support. Pt returned to room and removed shoes before returning to bed.  Pt remained in bed with all needs within reach and bed alarm activated.   Therapy Documentation Precautions:  Precautions Precautions: Back, Fall Precaution Comments: reviewed back precautions Required Braces or Orthoses: Spinal Brace Spinal Brace: Lumbar corset, Applied in sitting position Restrictions Weight Bearing Restrictions: No Pain:  Pt commented that his R foot was "getting uncomfortable" at end of session; pt removed shoes and rested in bed   Therapy/Group: Individual Therapy  Leroy Libman 01/05/2020, 2:24  PM

## 2020-01-05 NOTE — Patient Care Conference (Signed)
Inpatient RehabilitationTeam Conference and Plan of Care Update Date: 01/05/2020   Time: 11:07 AM    Patient Name: Colton Nichols      Medical Record Number: [id number]  Date of Birth: 11-19-1965 Sex: Male         Room/Bed: 4M09C/4M09C-01 Payor Info: Payor: /    Admit Date/Time:  12/22/2019  7:38 PM  Primary Diagnosis:  Cauda equina syndrome [facility])  Hospital Problems: Principal Problem:   Cauda equina syndrome [facility])    Expected Discharge Date: Expected Discharge Date: 01/07/20  Team Members Present: Physician leading conference: Dr. Genice Rouge Care Coodinator Present: Cecile Sheerer, LCSWA;Tesla Keeler Marlyne Beards, RN, BSN, CRRN Nurse Present: Other (comment) Tinnie Gens, RN) PT Present: Peter Congo, PT OT Present: Roney Mans, OT;Ardis Rowan, COTA SLP Present: Feliberto Gottron, SLP PPS Coordinator present : Edson Snowball, Park Breed, SLP     Current Status/Progress Goal Weekly Team Focus  Bowel/Bladder   Patient is continent of B/B.  Measure urine output with use of urinal.  LBM 01/03/20.  Patient will GI/GU continence  Assess GI/GU function Qshift and PRN.   Swallow/Nutrition/ Hydration             ADL's   supervision overall; don/doff LSO independently, adheres to back precautions with occasional verbal cue  mod I overall  activity tolerance, safety awareness, education   Mobility   Supervision overall for bed mobility, transfers with RW, gait 1000 ft (+) with RW, stairs  mod I with LRAD  d/c planning, HEP, LE strengthening   Communication             Safety/Cognition/ Behavioral Observations            Pain   Patient c/o mid lower back pain radiating to RLE and spasms.  Well managed with scheduled Oxycontin, Lidoderm patch Q12H and PRN muscle relaxants.  Patient will maintain pain level less than 4 with or without increased activity tolerance.  Assess pain level Qshift and PRN.  Encourage activity tolerance and non-pharmacological interventions.    Skin   Patient has surgical incision to mid lower back.  Daily dry dressing changes.  Patient will be free of any further breakdown or s/sx of infection.  Assess skin Qshift and PRN.  Promote wound healing through diet and activity.     Discharge Planning:  Pt uninsured. Patient to discharge home with mother. Mother to provide supervision assistance only.   Team Discussion: Continent B/B. Supervision/Mod I with RW and on target for discharge. Supervision/Mod I for ADL's and on target for discharge. PT requesting outpatient or HH. Will let SW know about equipment. Patient on target to meet rehab goals: yes  *See Care Plan and progress notes for long and short-term goals.   Revisions to Treatment Plan:  None  Teaching Needs: Nursing to begin Lovenox injection teaching.  Current Barriers to Discharge: Lack of insurance  Possible Resolutions to Barriers: Patient may home exercise program if outpatient therapy or HH is not an option.     Medical Summary Current Status: needs lovenox training; no wounds- just surgical site; continent  Barriers to Discharge: Decreased family/caregiver support;Home enviroment access/layout;Neurogenic Bowel & Bladder;Wound care;Weight bearing restrictions  Barriers to Discharge Comments: surgical incision. will need lovenox for another 1 month Possible Resolutions to [employer]: walking; no insurance currently. - Sup-mod I with RW- near goals- back precautions- d/c Thursday   Continued Need for Acute Rehabilitation Level of Care: The patient requires daily medical management by a physician with specialized training in  physical medicine and rehabilitation for the following reasons: Direction of a multidisciplinary physical rehabilitation program to maximize functional independence : Yes Medical management of patient stability for increased activity during participation in an intensive rehabilitation regime.: Yes Analysis of laboratory values  and/or radiology reports with any subsequent need for medication adjustment and/or medical intervention. : Yes   I attest that I was present, lead the team conference, and concur with the assessment and plan of the team.   Tennis Must 01/05/2020, 3:21 PM

## 2020-01-05 NOTE — Progress Notes (Signed)
Physical Therapy Session Note  Patient Details  Name: Colton Nichols MRN: [id number] Date of Birth: Jan 07, 1966  Today's Date: 01/05/2020 PT Individual Time: 1130-1200; 3086-5784 PT Individual Time Calculation (min): 30 min and 50 min  Short Term Goals: Week 2:  PT Short Term Goal 1 (Week 2): =LTG due to ELOS  Skilled Therapeutic Interventions/Progress Updates:    Session 1: Pt received seated in bed, agreeable to PT session. No complaints of pain. Pt is mod I for bed mobility with increased time needed. Pt is able to don lumbar corset and shoes while seated EOB with use of reacher. Sit to stand at Supervision to mod I level with use of RW during session. Ambulation 2 x 150 ft with RW at Supervision level. Sit to stand with no AD from mat table with CGA. Standing balance with no UE support and close SBA to CGA while performing step and horseshoe toss, 3 x 20 reps. Pt exhibits good overall balance with forward step with RLE. Pt exhibits improved confidence with standing without use of AD this AM. Pt returned to bed at end of session, mod I for bed mobility. Pt left seated in bed with needs in reach, bed alarm in place at end of session.  Session 2: Pt received seated in bed, agreeable to PT session. Pt reports ongoing pain on dorsum of R foot that increases with gait so he has been ambulating on his heel to avoid aggravating the pain. Pt is mod I for bed mobility and donning lumbar corset and shoes while seated EOB. Sit to stand at Supervision to mod I level to RW throughout session. Focus on heel/toe gait with RW at Supervision level. Pt reports increase in RLE pain with heel/toe gait and exhibits increased UE reliance on RW due to pain. Standing alt L/R 4" forwards and lateral step-ups for LE strengthening 2 x 15 reps each with BUE support in // bars. Heel/toe raises x 15 reps in // bars for LE strengthening, pt has no increase in RLE pain during this exercise. Pt returned to bed at end of session  at mod I level. Pt left semi-reclined in bed with needs in reach, bed alarm in place at end of session.   Therapy Documentation Precautions:  Precautions Precautions: Back, Fall Precaution Comments: reviewed back precautions Required Braces or Orthoses: Spinal Brace Spinal Brace: Lumbar corset, Applied in sitting position Restrictions Weight Bearing Restrictions: No    Therapy/Group: Individual Therapy   Peter Congo, PT, DPT  01/05/2020, 12:08 PM

## 2020-01-06 ENCOUNTER — Inpatient Hospital Stay (HOSPITAL_COMMUNITY): Payer: Self-pay

## 2020-01-06 ENCOUNTER — Inpatient Hospital Stay (HOSPITAL_COMMUNITY): Payer: Self-pay | Admitting: Physical Therapy

## 2020-01-06 MED ORDER — POLYETHYLENE GLYCOL 3350 17 G PO PACK: 17.0000 g | PACK | Freq: Every day | ORAL | 0 refills | Status: DC

## 2020-01-06 MED ORDER — HYDROCODONE-ACETAMINOPHEN 10-325 MG PO TABS: 1.0000 | ORAL_TABLET | ORAL | 0 refills | Status: DC | PRN

## 2020-01-06 MED ORDER — SENNOSIDES-DOCUSATE SODIUM 8.6-50 MG PO TABS: 2.0000 | ORAL_TABLET | Freq: Every day | ORAL | Status: DC

## 2020-01-06 MED ORDER — MELATONIN 3 MG PO TABS: 3.0000 mg | ORAL_TABLET | Freq: Every day | ORAL | 0 refills | Status: DC

## 2020-01-06 MED ORDER — BACLOFEN 5 MG PO TABS: 5.0000 mg | ORAL_TABLET | Freq: Three times a day (TID) | ORAL | 0 refills | Status: DC | PRN

## 2020-01-06 MED ORDER — ENOXAPARIN SODIUM 40 MG/0.4ML ~~LOC~~ SOLN: SUBCUTANEOUS | 1 refills | Status: DC

## 2020-01-06 MED ORDER — ACETAMINOPHEN 325 MG PO TABS: 650.0000 mg | ORAL_TABLET | ORAL | 0 refills | Status: AC | PRN

## 2020-01-06 MED ORDER — PRAZOSIN HCL 2 MG PO CAPS
2.0000 mg | ORAL_CAPSULE | Freq: Every day | ORAL | Status: DC
  Administered 2020-01-06: 2 mg via ORAL
  Filled 2020-01-06: qty 1

## 2020-01-06 MED ORDER — GABAPENTIN 300 MG PO CAPS: 300.0000 mg | ORAL_CAPSULE | Freq: Three times a day (TID) | ORAL | 1 refills | Status: DC

## 2020-01-06 MED ORDER — PRAZOSIN HCL 2 MG PO CAPS: 2.0000 mg | ORAL_CAPSULE | Freq: Every day | ORAL | 0 refills | Status: DC

## 2020-01-06 MED ORDER — OXYCODONE HCL ER 10 MG PO T12A: 10.0000 mg | EXTENDED_RELEASE_TABLET | Freq: Two times a day (BID) | ORAL | 0 refills | Status: DC

## 2020-01-06 MED ORDER — LIDOCAINE 5 % EX PTCH: 1.0000 | MEDICATED_PATCH | CUTANEOUS | 0 refills | Status: DC

## 2020-01-06 MED ORDER — CYCLOBENZAPRINE HCL 5 MG PO TABS: 5.0000 mg | ORAL_TABLET | Freq: Three times a day (TID) | ORAL | 0 refills | Status: DC | PRN

## 2020-01-06 MED ORDER — FERROUS SULFATE 325 (65 FE) MG PO TABS: 325.0000 mg | ORAL_TABLET | Freq: Every day | ORAL | 3 refills | Status: DC

## 2020-01-06 MED FILL — CYCLOBENZAPRINE HCL 5 MG TA: 5 | 20 days supply | Qty: 60 | Fill #0

## 2020-01-06 MED FILL — HYDROCODON-APAP 10-325: 10-325 | 5 days supply | Qty: 30 | Fill #0

## 2020-01-06 MED FILL — BACLOFEN 5 MG TABS: 5 | 20 days supply | Qty: 60 | Fill #0

## 2020-01-06 MED FILL — OxyCONTIN 10 MG T12A: 10 | 14 days supply | Qty: 28 | Fill #0

## 2020-01-06 MED FILL — PRAZOSIN HCL 2 MG CAPS: 2 | 30 days supply | Qty: 30 | Fill #0

## 2020-01-06 MED FILL — FERROUS SULFATE 325 MG TAB: 325 (65 FE) | 30 days supply | Qty: 30 | Fill #0

## 2020-01-06 MED FILL — LIDOCAINE PATCH 5%: 5 | 30 days supply | Qty: 30 | Fill #0

## 2020-01-06 MED FILL — GABAPENTIN 300 MG CAPSULE: 300 | 30 days supply | Qty: 90 | Fill #0

## 2020-01-06 MED FILL — ENOXAPARIN SODIUM 40 MG/0.4: 40 | 30 days supply | Qty: 12 | Fill #0

## 2020-01-06 MED FILL — MELATONIN 3 MG TABS: 3 | 30 days supply | Qty: 30 | Fill #0

## 2020-01-06 NOTE — Progress Notes (Signed)
Occupational Therapy Session Note  Patient Details  Name: Colton Nichols MRN: [id number] Date of Birth: [birth year]  Today's Date: 01/06/2020 OT Individual Time: 1330-1425 OT Individual Time Calculation (min): 55 min    Short Term Goals: Week 2:  OT Short Term Goal 1 (Week 2): STG=LTG secondary to ELOS  Skilled Therapeutic Interventions/Progress Updates:    OT intervention with focus on functional amb with RW, standing balance, AE equipment use, safety awareness, tub transfers, DME, discharge planning, and activity tolerance to prepare for discharge home tomorrow. Pt amb with RW to Day room and engaged in dynamic standing activity with [employer].  Pt retrieved bean bags from floor using reacher safely and appropriately. Pt practiced tub transfers. Pt completed simiple home mgmgt tasks using AE appropriately.  Discussed and demonstrated assembly of DME once at home. Pt pleased with progress and ready for discharge home tomorrow. Pt independent in room with RW.   Therapy Documentation Precautions:  Precautions Precautions: Back, Fall Precaution Comments: reviewed back precautions Required Braces or Orthoses: Spinal Brace Spinal Brace: Lumbar corset, Applied in sitting position Restrictions Weight Bearing Restrictions: No Pain:  Pt states his R foot is "feeling much better"   Therapy/Group: Individual Therapy  Rich Brave 01/06/2020, 2:57 PM

## 2020-01-06 NOTE — Discharge Instructions (Signed)
Inpatient Rehab Discharge Instructions  Colton Nichols Discharge date and time: No discharge date for patient encounter.   Activities/Precautions/ Functional Status: Activity: As tolerated Diet: Regular Wound Care: Routine skin checks Functional status:  ___ No restrictions     ___ Walk up steps independently ___ 24/7 supervision/assistance   ___ Walk up steps with assistance ___ Intermittent supervision/assistance  ___ Bathe/dress independently ___ Walk with walker     _x__ Bathe/dress with assistance ___ Walk Independently    ___ Shower independently ___ Walk with assistance    ___ Shower with assistance ___ No alcohol     ___ Return to work/school ________  COMMUNITY REFERRALS UPON DISCHARGE:    Home Health:   PT    OT   RN (pain management and back incision monitoring)  - charity                  Agency: [facility] [facility]: [phone]   Medical Equipment/Items Ordered: 3in1 bedside commode, tub transfer bench                                                 Agency/Supplier: [employer] [phone]  GENERAL COMMUNITY RESOURCES FOR PATIENT/FAMILY: Hospital follow-up appointment at [facility] [phone]) on Sept 9 at 9:50am with Georgian Co, PA [address] [place], [place] [address] *Please call the office if you need to reschedule the appointment. Be sure to discuss orange card at time of visit.*  Special Instructions: No driving smoking or alcohol   My questions have been answered and I understand these instructions. I will adhere to these goals and the provided educational materials after my discharge from the hospital.  Patient/Caregiver Signature _______________________________ Date __________  Clinician Signature _______________________________________ Date __________  Please bring this form and your medication list with you to all your follow-up doctor's appointments.

## 2020-01-06 NOTE — Progress Notes (Signed)
Pt being discharged with Lovenox for home. Educated on technique yesterday, pt able to administer own injection this shift. Observed to use correct technique, tolerated well.

## 2020-01-06 NOTE — Progress Notes (Signed)
Physical Therapy Discharge Summary  Patient Details  Name: Colton Nichols MRN: [id number] Date of Birth: 03/11/1966  Today's Date: 01/06/2020   Patient has met 6 of 6 long term goals due to improved activity tolerance, improved balance, improved postural control, increased strength and ability to compensate for deficits.  Patient to discharge at an ambulatory level Modified Independent.   Patient's care partner is not necessary to provide the necessary as patient is at mod I level.  Reasons goals not met: Patient has met all rehab goals.  Recommendation:  Patient will benefit from ongoing skilled PT services in home health setting to continue to advance safe functional mobility, address ongoing impairments in endurance, strength, balance, safety, and minimize fall risk.  Equipment: Pt already owns RW.  Reasons for discharge: treatment goals met and discharge from hospital  Patient/family agrees with progress made and goals achieved: Yes  PT Discharge Precautions/Restrictions Precautions Precautions: Back;Fall Precaution Comments: reviewed back precautions Required Braces or Orthoses: Spinal Brace Spinal Brace: Lumbar corset;Applied in sitting position Restrictions Weight Bearing Restrictions: No Vision/Perception  Perception Perception: Within Functional Limits Praxis Praxis: Intact  Cognition Overall Cognitive Status: Within Functional Limits for tasks assessed Arousal/Alertness: Awake/alert Orientation Level: Oriented X4 Attention: Focused;Sustained Focused Attention: Appears intact Sustained Attention: Appears intact Memory: Appears intact Awareness: Appears intact Problem Solving: Appears intact Safety/Judgment: Appears intact Sensation Sensation Light Touch: Impaired Detail Light Touch Impaired Details: Impaired RLE (N/T) Proprioception: Impaired Detail Proprioception Impaired Details: Impaired RLE Coordination Gross Motor Movements are Fluid and  Coordinated: Yes Fine Motor Movements are Fluid and Coordinated: Yes Motor  Motor Motor: Abnormal postural alignment and control;Paraplegia Motor - Discharge Observations: ongoing RLE weakness  Mobility Bed Mobility Bed Mobility: Rolling Right;Rolling Left;Supine to Sit;Sit to Supine Rolling Right: Independent with assistive device Rolling Left: Independent with assistive device Supine to Sit: Independent with assistive device Sit to Supine: Independent with assistive device Transfers Transfers: Sit to Stand;Stand to Sit;Stand Pivot Transfers Sit to Stand: Independent with assistive device Stand to Sit: Independent with assistive device Stand Pivot Transfers: Independent with assistive device Transfer (Assistive device): Rolling walker Locomotion  Gait Ambulation: Yes Gait Assistance: Independent with assistive device Gait Distance (Feet): 1000 Feet Assistive device: Rolling walker Gait Gait: Yes Gait Pattern: Impaired Gait Pattern: Decreased dorsiflexion - right;Decreased hip/knee flexion - right;Antalgic Gait velocity: decreased Stairs / Additional Locomotion Stairs: Yes Stairs Assistance: Independent with assistive device Stair Management Technique: Two rails;Step to pattern Number of Stairs: 12 Height of Stairs: 6 Ramp: Independent with assistive device Curb: Supervision/Verbal cueing Wheelchair Mobility Wheelchair Mobility: No  Trunk/Postural Assessment  Cervical Assessment Cervical Assessment: Within Functional Limits Thoracic Assessment Thoracic Assessment: Within Functional Limits Lumbar Assessment Lumbar Assessment: Exceptions to WFL (LSO; back precautions) Postural Control Postural Control: Within Functional Limits  Balance Balance Balance Assessed: Yes Static Sitting Balance Static Sitting - Balance Support: Feet supported Static Sitting - Level of Assistance: 7: Independent Dynamic Sitting Balance Dynamic Sitting - Balance Support: During functional  activity Dynamic Sitting - Level of Assistance: 6: Modified independent (Device/Increase time) Static Standing Balance Static Standing - Balance Support: Bilateral upper extremity supported;During functional activity Static Standing - Level of Assistance: 6: Modified independent (Device/Increase time) Dynamic Standing Balance Dynamic Standing - Balance Support: Bilateral upper extremity supported;During functional activity Dynamic Standing - Level of Assistance: 6: Modified independent (Device/Increase time) Extremity Assessment   RLE Assessment RLE Assessment: Exceptions to [facility] Passive Range of Motion (PROM) Comments: tight HS General Strength Comments: impaired, see below RLE Strength Right  Hip Flexion: 2+/5 Right Knee Flexion: 4/5 Right Knee Extension: 3-/5 Right Ankle Dorsiflexion: 3/5 LLE Assessment LLE Assessment: Exceptions to [facility] Passive Range of Motion (PROM) Comments: tight HS General Strength Comments: impaired, see below LLE Strength Left Hip Flexion: 3/5 Left Knee Flexion: 4/5 Left Knee Extension: 5/5 Left Ankle Dorsiflexion: 5/5     Excell Seltzer, PT, DPT 01/06/2020, 4:27 PM

## 2020-01-06 NOTE — Plan of Care (Signed)
  Problem: RH SKIN INTEGRITY Goal: RH STG SKIN FREE OF INFECTION/BREAKDOWN Description: Remain free of infection/further breakdown during rehab stay  Outcome: Progressing Goal: RH STG MAINTAIN SKIN INTEGRITY WITH ASSISTANCE Description: STG Maintain Skin Integrity With min Assistance. Outcome: Progressing   Problem: RH SAFETY Goal: RH STG ADHERE TO SAFETY PRECAUTIONS W/ASSISTANCE/DEVICE Description: STG Adhere to Safety Precautions With min Assistance/Device. Outcome: Progressing Goal: RH STG DECREASED RISK OF FALL WITH ASSISTANCE Description: STG Decreased Risk of Fall With min Assistance. Outcome: Progressing

## 2020-01-06 NOTE — Progress Notes (Signed)
Occupational Therapy Discharge Summary  Patient Details  Name: Colton Nichols MRN: [id number] Date of Birth: 06/25/1965  Patient has met 42 of 14 long term goals due to improved activity tolerance, improved balance, postural control, ability to compensate for deficits and improved coordination.  Pt made excellent progress with BADLs and IADLs during this admission.  Pt is mod I for all BADLs and IADLs. Pt can don/doff LSO independently. Pt is mod I for all functional transfers and amb with RW. Patient to discharge at overall Modified Independent level.  Patient's care partner is independent to provide the necessary physical assistance at discharge.    Reasons goals not met: n/a  Recommendation:  No f/u needed at this time  Equipment: BSC, shower seat  Reasons for discharge: treatment goals met and discharge from hospital  Patient/family agrees with progress made and goals achieved: Yes  OT Discharge AVision Baseline Vision/History: No visual deficits Patient Visual Report: No change from baseline Vision Assessment?: No apparent visual deficits Perception  Perception: Within Functional Limits Praxis Praxis: Intact Cognition Overall Cognitive Status: Within Functional Limits for tasks assessed Arousal/Alertness: Awake/alert Orientation Level: Oriented X4 Attention: Focused;Sustained Focused Attention: Appears intact Sustained Attention: Appears intact Memory: Appears intact Awareness: Appears intact Problem Solving: Appears intact Safety/Judgment: Appears intact Sensation Sensation Light Touch: Impaired Detail Light Touch Impaired Details: Impaired RLE Proprioception: Impaired by gross assessment Coordination Gross Motor Movements are Fluid and Coordinated: Yes Fine Motor Movements are Fluid and Coordinated: Yes Finger Nose Finger Test: WNL Motor  Motor Motor: Abnormal postural alignment and control;Paraplegia    Trunk/Postural Assessment  Cervical  Assessment Cervical Assessment: Within Functional Limits Thoracic Assessment Thoracic Assessment: Within Functional Limits Lumbar Assessment Lumbar Assessment:  (LSO, back precautions)  Balance Static Sitting Balance Static Sitting - Balance Support: Feet supported Static Sitting - Level of Assistance: 7: Independent Dynamic Sitting Balance Dynamic Sitting - Balance Support: During functional activity Dynamic Sitting - Level of Assistance: 6: Modified independent (Device/Increase time) Extremity/Trunk Assessment RUE Assessment RUE Assessment: Within Functional Limits LUE Assessment LUE Assessment: Within Functional Limits   Leroy Libman 01/06/2020, 6:31 AM

## 2020-01-06 NOTE — Progress Notes (Signed)
Physical Therapy Session Note  Patient Details  Name: Colton Nichols MRN: [id number] Date of Birth: 06/22/1965  Today's Date: 01/06/2020 PT Individual Time: 0900-1015 PT Individual Time Calculation (min): 75 min   Short Term Goals: Week 2:  PT Short Term Goal 1 (Week 2): =LTG due to ELOS  Skilled Therapeutic Interventions/Progress Updates:    Pt received seated in bed, agreeable to PT session. Pt reports soreness in his back this AM, declines intervention during session but requests muscle relaxer from RN at end of session. Pt is mod I for all bed mobility and transfers with RW this date. Pt demonstrates good safety awareness of back precautions and when to wear lumbar corset. Pt safe to be mod I in his room, team updated and aware. Ambulation x 300 ft with RW mod I. Car transfer mod I. Ascend/descend ramp and ambulation across uneven surface with RW at mod I level. Pt is at Supervision level for curb navigation with RW. Ascend/descend 12 x 6" stairs with 2 handrails at mod I level. Pt able to retrieve objects from floor with reacher at mod I level with use of RW for balance. Standing BLE strengthening therex: mini-squats 2 x 10 reps, heel raises x 10 reps, HS curls x 10 reps. Pt left seated in bed with needs in reach at end of session.  Therapy Documentation Precautions:  Precautions Precautions: Back, Fall Precaution Comments: reviewed back precautions Required Braces or Orthoses: Spinal Brace Spinal Brace: Lumbar corset, Applied in sitting position Restrictions Weight Bearing Restrictions: No    Therapy/Group: Individual Therapy   Peter Congo, PT, DPT  01/06/2020, 4:19 PM

## 2020-01-06 NOTE — Progress Notes (Addendum)
Patient ID: Colton Nichols, male   DOB: 1966/02/17, 54 y.o.   MRN: [id number]   Pt accepted for charity [facility] for HHPT/OT/SN (observation of back incision and pain management).  SW scheduled hospital follow-up appointment with [facility] [phone]) for Sept 9 at 9:50am with Georgian Co, PA. SW requested orange card be discussed at time of visit.  *SW provided pt with appointment card and updated on above about HHA.   Cecile Sheerer, MSW, LCSWA Office: [phone] Cell: [phone] Fax: [phone]

## 2020-01-06 NOTE — Progress Notes (Signed)
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  Will try Prazosin for sleep.   ROS:   Pt denies SOB, abd pain, CP, N/V/C/D, and vision changes   Objective:   No results found. Recent Labs    01/05/20 0511  WBC 7.5  HGB 9.2*  HCT 28.8*  PLT 535*   Recent Labs    01/04/20 0506  NA 137  K 4.3  CL 102  CO2 28  GLUCOSE 94  BUN 10  CREATININE 1.00  CALCIUM 9.3    Intake/Output Summary (Last 24 hours) at 01/06/2020 1905 Last data filed at 01/06/2020 1310 Gross per 24 hour  Intake 400 ml  Output 1400 ml  Net -1000 ml     Physical Exam: Vital Signs Blood pressure 120/78, pulse 84, temperature 97.8 F (36.6 C), resp. rate 17, height 6\' 1"  (1.854 m), weight 57.3 kg, SpO2 100 %. General: alert, talkative- on toilet- got off on own and went to bed with RW since I supervised, NAD HEENT: conjugate gaze Neck: Supple without JVD or lymphadenopathy Heart: RRR Chest: CTA B/L- no W/R/R- good air movement Abdomen: Soft, NT, ND, (+)BS  -just had BM Extremities: No clubbing, cyanosis, or edema. Pulses are 2+ Skin: No evidence of breakdown, no evidence of rash Sensory able to distinguish LT in both feet- TTP over top of R foot- specifically on dorsum of R foot- less TTP today Neuro: Speech clear. Able to follow commands without difficulty. Significant back pain with attempts at RLE flexion/ext.  RLE 2-/5 strength on HF and KE, 3/5 DF and PF, LLE and upper extremities with 5/5 strength  Assessment/Plan: 1. Functional deficits secondary to cauda equina syndrome which require 3+ hours per day of interdisciplinary therapy in a comprehensive inpatient rehab setting.  Physiatrist is providing close team supervision and 24 hour  management of active medical problems listed below.  Physiatrist and rehab team continue to assess barriers to discharge/monitor patient progress toward functional and medical goals  Care Tool:  Bathing  Bathing activity did not occur: Refused Body parts bathed by patient: Right arm, Left arm, Chest, Abdomen, Front perineal area, Right upper leg, Left upper leg, Face, Buttocks, Right lower leg, Left lower leg         Bathing assist Assist Level: Independent with assistive device     Upper Body Dressing/Undressing Upper body dressing Upper body dressing/undressing activity did not occur (including orthotics): Refused What is the patient wearing?: Orthosis, Pull over shirt    Upper body assist Assist Level: Independent    Lower Body Dressing/Undressing Lower body dressing    Lower body dressing activity did not occur: Refused What is the patient wearing?: Pants     Lower body assist Assist for lower body dressing: Independent with assitive device     Toileting Toileting    Toileting assist Assist for toileting: Independent with assistive device Assistive Device Comment:  (urinal)   Transfers Chair/bed transfer  Transfers assist     Chair/bed transfer assist level: Independent with assistive device Chair/bed transfer assistive device:   Ambulation assist      Assist level: Independent with assistive device Assistive device: Walker-rolling Max distance: 1000'   Walk 10 feet activity  Assist     Assist level: Independent with assistive device Assistive device: Walker-rolling   Walk 50 feet activity   Assist    Assist level: Independent with assistive device Assistive device: Walker-rolling    Walk 150 feet activity   Assist    Assist level: Independent with assistive device Assistive device: Walker-rolling    Walk 10 feet on uneven surface  activity   Assist Walk 10 feet on uneven surfaces activity  did not occur: Safety/medical concerns   Assist level: Independent with assistive device Assistive device: Photographer Will patient use wheelchair at discharge?: No             Wheelchair 50 feet with 2 turns activity    Assist            Wheelchair 150 feet activity     Assist          Blood pressure 120/78, pulse 84, temperature 97.8 F (36.6 C), resp. rate 17, height 6\' 1"  (1.854 m), weight 57.3 kg, SpO2 100 %.  Medical Problem List and Plan: 1.  Impaired mobility and ADLs secondary to cauda equina syndrome             -patient may shower but incision must be covered.  8/13- working on SSI/disability  8/16- needs to speak to SSI again- gave wrong email.    8/17- still trying to reach them             -ELOS/Goals: 7-8 days modI  -Continue CIR PT, OT     2.  Antithrombotics: -DVT/anticoagulation:  Mechanical: Sequential compression devices, below knee Bilateral lower extremities Order dopplers for am- not done yet  8/9- pt at high risk for DVT- will check into dopplers and Lovenox- will call [facility]  8/16- we called- didn't hear back- it's been >10 days- will start 40 mg daily  8/18- send home 1 month Lovenox- even though walking, is cauda equina.               -antiplatelet therapy: N/A 3. Pain Management:  Oxycodone and flexeril prn. Add Oxycontin 10 mg bid for consistent pain relief--still looks in quite a bit of pain. Will add gabapentin tid for neuropathy also. Add kpad. Add baclofen 8/6 for muscle spasms.   8/9- c/o "overdoing it"- will see how does today and maybe make changes tomorrow  8/10- will add lidoderm patch on top of R foot 8am-8pm for nerve pain  8/11- was helpful for pain- when it was on- wants to continue  8/15: pain is relatively well controlled  8/16- SCDs help nerve pain in legs 4. Mood: LCSW to follow for evaluation and support.              -antipsychotic agents: N/A 5. Neuropsych: This patient is  capable of making decisions on his own behalf. 6. Skin/Wound Care: Spinal incision with honeycomb dressing. IV d/ced.   8/12- d/c honeycomb dressing and change dressing daily.  7. Fluids/Electrolytes/Nutrition: Monitor I/O. Add protein supplement for low calorie malnutrition. K+ 3.7 on 8/2.  Normal on 8/4  8. Leucocytosis: Likely reactive--17.1-->12.8. Normal on 8/4  8/17- normal 9. ABLA: Hgb 9.2 on 8/2. 7.4 on 8/5. Iron level 10 on 8/5- started iron supplement . 8/9- Hb 7.8- back up slightly  8/17- Hb 9.2 this AM 10. Thrombocytopenia: Progressive--202-->112. No heparin or enoxaparin, recheck CBC in am   8/10- Plts 545k  8/17- Plts 535k- con't regimen 11. Hyponatremia improved  nl on 8/4  8/9- Na 136- resolved 12. Constipation: Schedule Miralax daily in am --effective.  Had good BM 8/5  8/15: 1-2 BM per day 13. HTN: Monitor BP TID -- very well controlled.  Vitals:   01/06/20 0530 01/06/20 1300  BP: 122/79 120/78  Pulse: 77 84  Resp: 14 17  Temp: 98.4 F (36.9 C) 97.8 F (36.6 C)  SpO2: 100% 100%  8/17- BP controlled- con't regimen 14. Hypomagnesemia:resolved  Mg level improved to 2.2 on 8/5  15. "freezing"  8/9- maybe overdid it- WBC nml- U/A (-)- doing better in afternoon- will monitor  8/10- feeling better today 16. Nerve pain  8/11- lidoderm patch is helpful   8/14: well controlled  8/16- SCDs help as well.  17. Bilateral ear fullness: Debrox ear drops ordered and administered. Improved.  18. Poor sleep  8/18- will try Prazosin 2 mg QHS 19, Dispo  8/18- d/c tomorrow- will need f/u with Dr Berline Chough- hospital f/u.    LOS: 15 days A FACE TO FACE EVALUATION WAS PERFORMED  Cendy Oconnor 01/06/2020, 7:05 PM

## 2020-01-06 NOTE — Progress Notes (Signed)
Occupational Therapy Session Note  Patient Details  Name: Colton Nichols MRN: [id number] Date of Birth: 1965-09-19  Today's Date: 01/06/2020 OT Individual Time: 0700-0810 OT Individual Time Calculation (min): 70 min    Short Term Goals: Week 2:  OT Short Term Goal 1 (Week 2): STG=LTG secondary to ELOS  Skilled Therapeutic Interventions/Progress Updates:    Pt sitting EOB upon arrival.  Pt stated he was ready for therapy. Pt amb with RW to tub room and practiced tub seat transfers X 3 at mod I level. Pt amb with RW to Day Room and engaged in therex on NuStep for BLE strengthening and cardio endurance-7 mins load 5. Pt transitioned to Wii balance board activities-penguin with focus on R/L weight shifts X 3, slalom skiing with focus on R/L and ant/post weight shifts x 3, and tilt table with focus on multiplane weight shifts X 2. Pt returned to room and remained seated EOB to eat 2nd breakfast. Bed alarm activated and all needs within reach.   Therapy Documentation Precautions:  Precautions Precautions: Back, Fall Precaution Comments: reviewed back precautions Required Braces or Orthoses: Spinal Brace Spinal Brace: Lumbar corset, Applied in sitting position Restrictions Weight Bearing Restrictions: No Pain:  Pt c/o increased R foot discomfort with activity; removed shoes and repositioned   Therapy/Group: Individual Therapy  Rich Brave 01/06/2020, 8:10 AM

## 2020-01-07 NOTE — Progress Notes (Signed)
Inpatient Rehabilitation Care Coordinator  Discharge Note  The overall goal for the admission was met for:   Discharge location: Yes. D/c to home with supervision from mother.   Length of Stay: Yes. 15 days.   Discharge activity level: Yes. Mod I.   Home/community participation: Yes. Limited.   Services provided included: MD, RD, PT, OT, RN, CM, TR, Pharmacy, Neuropsych and SW  Financial Services: Other: Uninsured  Follow-up services arranged: Home Health: [facility] for HHPT/OT/SN(pain management and incision observation-charity) and DME: [facility] for TTB and [facility]in[facility] BSC (charity)  Comments (or additional information):contact pt [phone]  Patient/Family verbalized understanding of follow-up arrangements: Yes  Individual responsible for coordination of the follow-up plan: Pt to have assistance with coordinating care needs.   Confirmed correct DME delivered: Rana Snare 01/07/2020    Rana Snare

## 2020-01-07 NOTE — Progress Notes (Signed)
Patient escorted to patient pickup at 1355 alert and oriented per usual, ready to go and without c/o.

## 2020-01-07 NOTE — Progress Notes (Signed)
Patient ID: Colton Nichols, male   DOB: 12-18-1965, 54 y.o.   MRN: [id number]   SW met with pt in room to inform on he was accepted for charity HH with [facility] for HHPT/OT/SN (observation of back incision and pain management). SW also informed pt that he his medications are at no cost. Pt reports mother will pick him up today between 1pm-2pm as she is coming after work.   Loralee Pacas, MSW, [place] Office: [phone] Cell: [phone] Fax: [phone]

## 2020-01-07 NOTE — Progress Notes (Signed)
[place] PHYSICAL MEDICINE & REHABILITATION PROGRESS NOTE   Subjective/Complaints:    Said slept "rough" but slept all night- didn't remember any dreams- slept until this AM, didn't even realize it was almost 8am when I cam ein.   Ready for d/c today- checking with mother on ride-  Explained why I would like him to take lovenox, if he can get it- since will reduce risk of DVT?PE- which I explained what they were and how it reduces risk- at least 1 month would be better than nothing.   Don't refill Lovenox after that.   ROS:   Pt denies SOB, abd pain, CP, N/V/C/D, and vision changes  Objective:   No results found. Recent Labs    01/05/20 0511  WBC 7.5  HGB 9.2*  HCT 28.8*  PLT 535*   No results for input(s): NA, K, CL, CO2, GLUCOSE, BUN, CREATININE, CALCIUM in the last 72 hours.  Intake/Output Summary (Last 24 hours) at 01/07/2020 0901 Last data filed at 01/07/2020 0350 Gross per 24 hour  Intake 200 ml  Output 1950 ml  Net -1750 ml     Physical Exam: Vital Signs Blood pressure 136/90, pulse 88, temperature 98.3 F (36.8 C), temperature source Oral, resp. rate 18, height 6\' 1"  (1.854 m), weight 57.3 kg, SpO2 100 %. General: alert, talkative, laying supine in bed; just woke him up, NAD HEENT: conjugate gaze Neck: Supple without JVD or lymphadenopathy Heart: RRR Chest: CTA B/L- no W/R/R- good air movement Abdomen: Soft, NT, ND, (+)BS  Extremities: No clubbing, cyanosis, or edema. Pulses are 2+ Skin: No evidence of breakdown, no evidence of rash Sensory able to distinguish LT in both feet- TTP over top of R foot- specifically on dorsum of R foot- less TTP today Neuro: Ox3 Significant back pain with attempts at RLE flexion/ext.  RLE 2-/5 strength on HF and KE, 3/5 DF and PF, LLE and upper extremities with 5/5 strength  Assessment/Plan: 1. Functional deficits secondary to cauda equina syndrome which require 3+ hours per day of interdisciplinary therapy in a  comprehensive inpatient rehab setting.  Physiatrist is providing close team supervision and 24 hour management of active medical problems listed below.  Physiatrist and rehab team continue to assess barriers to discharge/monitor patient progress toward functional and medical goals  Care Tool:  Bathing  Bathing activity did not occur: Refused Body parts bathed by patient: Right arm, Left arm, Chest, Abdomen, Front perineal area, Right upper leg, Left upper leg, Face, Buttocks, Right lower leg, Left lower leg         Bathing assist Assist Level: Independent with assistive device     Upper Body Dressing/Undressing Upper body dressing Upper body dressing/undressing activity did not occur (including orthotics): Refused What is the patient wearing?: Orthosis, Pull over shirt    Upper body assist Assist Level: Independent    Lower Body Dressing/Undressing Lower body dressing    Lower body dressing activity did not occur: Refused What is the patient wearing?: Pants     Lower body assist Assist for lower body dressing: Independent with assitive device     Toileting Toileting    Toileting assist Assist for toileting: Independent with assistive device Assistive Device Comment:  (urinal)   Transfers Chair/bed transfer  Transfers assist     Chair/bed transfer assist level: Independent with assistive device Chair/bed transfer assistive device:   Ambulation assist      Assist level: Independent with assistive device Assistive device: Walker-rolling Max  distance: 1000'   Walk 10 feet activity   Assist     Assist level: Independent with assistive device Assistive device: Walker-rolling   Walk 50 feet activity   Assist    Assist level: Independent with assistive device Assistive device: Walker-rolling    Walk 150 feet activity   Assist    Assist level: Independent with assistive device Assistive device: Walker-rolling     Walk 10 feet on uneven surface  activity   Assist Walk 10 feet on uneven surfaces activity did not occur: Safety/medical concerns   Assist level: Independent with assistive device Assistive device: Photographer Will patient use wheelchair at discharge?: No             Wheelchair 50 feet with 2 turns activity    Assist            Wheelchair 150 feet activity     Assist          Blood pressure 136/90, pulse 88, temperature 98.3 F (36.8 C), temperature source Oral, resp. rate 18, height 6\' 1"  (1.854 m), weight 57.3 kg, SpO2 100 %.  Medical Problem List and Plan: 1.  Impaired mobility and ADLs secondary to cauda equina syndrome             -patient may shower but incision must be covered.  8/13- working on SSI/disability  8/16- needs to speak to SSI again- gave wrong email.    8/17- still trying to reach them             -ELOS/Goals: 7-8 days modI  -Continue CIR PT, OT     2.  Antithrombotics: -DVT/anticoagulation:  Mechanical: Sequential compression devices, below knee Bilateral lower extremities Order dopplers for am- not done yet  8/9- pt at high risk for DVT- will check into dopplers and Lovenox- will call [facility]  8/16- we called- didn't hear back- it's been >10 days- will start 40 mg daily  8/18- send home 1 month Lovenox- even though walking, is cauda equina.               -antiplatelet therapy: N/A 3. Pain Management:  Oxycodone and flexeril prn. Add Oxycontin 10 mg bid for consistent pain relief--still looks in quite a bit of pain. Will add gabapentin tid for neuropathy also. Add kpad. Add baclofen 8/6 for muscle spasms.   8/9- c/o "overdoing it"- will see how does today and maybe make changes tomorrow  8/10- will add lidoderm patch on top of R foot 8am-8pm for nerve pain  8/11- was helpful for pain- when it was on- wants to continue  8/15: pain is relatively well controlled  8/16- SCDs help nerve pain in legs  8/18-  con't pain meds at current doses 4. Mood: LCSW to follow for evaluation and support.              -antipsychotic agents: N/A 5. Neuropsych: This patient is capable of making decisions on his own behalf. 6. Skin/Wound Care: Spinal incision with honeycomb dressing. IV d/ced.   8/12- d/c honeycomb dressing and change dressing daily.  7. Fluids/Electrolytes/Nutrition: Monitor I/O. Add protein supplement for low calorie malnutrition. K+ 3.7 on 8/2.  Normal on 8/4  8. Leucocytosis: Likely reactive--17.1-->12.8. Normal on 8/4  8/17- normal 9. ABLA: Hgb 9.2 on 8/2. 7.4 on 8/5. Iron level 10 on 8/5- started iron supplement . 8/9- Hb 7.8- back up slightly  8/17- Hb 9.2 this AM 10. Thrombocytopenia: Progressive--202-->112. No  heparin or enoxaparin, recheck CBC in am   8/10- Plts 545k  8/17- Plts 535k- con't regimen 11. Hyponatremia improved nl on 8/4  8/9- Na 136- resolved 12. Constipation: Schedule Miralax daily in am --effective.  Had good BM 8/5  8/15: 1-2 BM per day 13. HTN: Monitor BP TID -- very well controlled.  Vitals:   01/06/20 1926 01/07/20 0356  BP: 108/71 136/90  Pulse: 87 88  Resp: 18 18  Temp: 98.3 F (36.8 C) 98.3 F (36.8 C)  SpO2: 100% 100%  8/19- BP controlled- con't meds 14. Hypomagnesemia:resolved  Mg level improved to 2.2 on 8/5  15. "freezing"  8/9- maybe overdid it- WBC nml- U/A (-)- doing better in afternoon- will monitor  8/10- feeling better today 16. Nerve pain  8/11- lidoderm patch is helpful   8/14: well controlled  8/16- SCDs help as well.  17. Bilateral ear fullness: Debrox ear drops ordered and administered. Improved.  18. Poor sleep  8/18- will try Prazosin 2 mg QHS 19, Dispo  8/18- d/c tomorrow- will need f/u with Dr Berline Chough- hospital f/u.   8/19- will f/u with Dr Berline Chough in clinic- doesn't need transitional care   LOS: 16 days A FACE TO FACE EVALUATION WAS PERFORMED  Colton Nichols 01/07/2020, 9:01 AM

## 2020-01-12 DIAGNOSIS — D62 Acute posthemorrhagic anemia: Secondary | ICD-10-CM

## 2020-01-12 DIAGNOSIS — G629 Polyneuropathy, unspecified: Secondary | ICD-10-CM

## 2020-01-12 NOTE — Discharge Summary (Signed)
Physician Discharge Summary  Patient ID: Colton Nichols MRN: [id number] DOB/AGE: 1966/01/05 54 y.o.  Admit date: 12/22/2019 Discharge date: 01/07/2020  Discharge Diagnoses:  Principal Problem:   Cauda equina syndrome (HCC) Active Problems:   Burst fracture of lumbar vertebra (HCC)   Acute blood loss anemia   Neuropathy   Discharged Condition: stable.   Significant Diagnostic Studies: N/A   Labs:  Basic Metabolic Panel: BMP Latest Ref Rng & Units 01/04/2020 12/28/2019 12/23/2019  Glucose 70 - 99 mg/dL 94 619(J) 94  BUN 6 - 20 mg/dL 10 11 6   Creatinine 0.61 - 1.24 mg/dL 0.93 2.67  Sodium 135 - 145 mmol/L 137 136 135  Potassium 3.5 - 5.1 mmol/L 4.3 4.3 3.9  Chloride 98 - 111 mmol/L 102 100 99  CO2 22 - 32 mmol/L 28 26 28   Calcium 8.9 - 10.3 mg/dL 9.3 9.3 1.24)    CBC: CBC Latest Ref Rng & Units 01/05/2020 12/29/2019 12/28/2019  WBC 4.0 - 10.5 K/uL 7.5 8.3 8.4  Hemoglobin 13.0 - 17.0 g/dL 02/28/2020) 02/27/2020) 7.8(L)  Hematocrit 39 - 52 % 28.8(L) 25.3(L) 23.7(L)  Platelets 150 - 400 K/uL 535(H) 545(H) 513(H)    CBG: No results for input(s): GLUCAP in the last 168 hours.  Brief HPI:   Colton Nichols is a 54 y.o. male in relatively good health was admitted on 12/19/2019 after a fall at work.  He struck the back of his head and landed on buttocks and developed severe low back pain shooting to BLE as well as fecal incontinence.  He was found to have L3 burst fracture with 1 cm retropulsion and pronounced canal compromise with fracture of bilateral spinous processes.  He was evaluated by Dr. 53 and found to have mild sensory deficits in L4/5 and S1 dermatomes as well as cauda equina symptoms.  He was taken to the OR for emergent L3 decompressive laminectomy with decompression of spinal canal for open reduction L3 fracture on the same day.  Follow-up labs revealed ABLA as well as thrombocytopenia.  He continued to have RLE greater than LLE weakness with sensory deficits, neuropathy and  pain affecting ADLs and mobility.  CIR was recommended due to functional decline.   Hospital Course: Colton Nichols was admitted to rehab 12/22/2019 for inpatient therapies to consist of PT and OT at least three hours five days a week. Past admission physiatrist, therapy team and rehab RN have worked together to provide customized collaborative inpatient rehab.  Pain was poorly controlled at admission therefore Oxycontin was added for more consistent pain control. Gabapentin was added to help manage neuropathy and titrated up to 300 mg tid without SE. Baclofen has been used to control spasticity. Back incision is C/D/I and healing well.  Check of lytes showed that hyponatremia and hypomagnesemia as well as stress induced hyperglycemia has resolved. Bowel program was started and augmented to help manage constipation as patient refused any suppositories or enema. He is continent of bowel and bladder and is voiding without signs of retention.   Serial CBC showed that thrombocytopenia has resolved and SQ Lovenox added on 08/16. 02/21/2020 He was instructed to continue Lovenox for addition month to complete his DVT prophylaxis course.  Reactive leucocytosis has been monitored with serial checks and has resolved. ABLA is slowly improving with addition of iron supplement. Prazosin was added to help with poor sleep with PTSD type symptoms at bedtime. His blood pressures were monitored on TID basis and has been controlled. [facility]  will provide HHPT, HHOT and HHRN after discharge.    Rehab course: During patient's stay in rehab weekly team conferences were held to monitor patient's progress, set goals and discuss barriers to discharge. At admission, patient required min assist with mobility and with basic self care tasks. He has had improvement in activity tolerance, balance, postural control as well as ability to compensate for deficits. He is modified independent for transfers and to ambulate 1000' with RW. He is  able to climb 12 stairs at modified independent level. No family education needed due to patient's level of independence.  Discharge disposition: 01-Home or Self Care  Diet: Regular.   Special Instructions: 1. Keep wound clean and dry.  2.Continue back precautions. No driving or strenuous activity till cleared by MD.   Discharge Instructions    Ambulatory referral to Physical Medicine Rehab   Complete by: As directed    Moderate complexity follow-up 1 to 2 weeks cauda equina syndrome     Allergies as of 01/07/2020   No Known Allergies     Medication List    STOP taking these medications   diazepam 5 MG tablet Commonly known as: VALIUM   Muscle Rub 10-15 % Crea   nicotine 21 mg/24hr patch Commonly known as: NICODERM CQ - dosed in mg/24 hours   ondansetron 4 MG tablet Commonly known as: ZOFRAN   Oxycodone HCl 10 MG Tabs Replaced by: oxyCODONE 10 mg 12 hr tablet     TAKE these medications   acetaminophen 325 MG tablet Commonly known as: TYLENOL Take 2 tablets (650 mg total) by mouth every 4 (four) hours as needed for up to 10 days for mild pain ((score 1 to 3) or temp > 100.5).   Baclofen 5 MG Tabs Take 5 mg by mouth 3 (three) times daily as needed for muscle spasms.   cyclobenzaprine 5 MG tablet Commonly known as: FLEXERIL Take 1 tablet (5 mg total) by mouth 3 (three) times daily as needed for muscle spasms.   enoxaparin 40 MG/0.4ML injection Commonly known as: LOVENOX Subcutaneous Lovenox 40 mg daily x1 month and stop   ferrous sulfate 325 (65 FE) MG tablet Take 1 tablet (325 mg total) by mouth daily before lunch.   gabapentin 300 MG capsule Commonly known as: NEURONTIN Take 1 capsule (300 mg total) by mouth 3 (three) times daily.   HYDROcodone-acetaminophen 10-325 MG tablet--Rx# 30 pills Commonly known as: NORCO Take 1 tablet by mouth every 4 (four) hours as needed for moderate pain ((score 4 to 6)).   lidocaine 5 % Commonly known as: LIDODERM Place 1  patch onto the skin daily. Remove & Discard patch within 12 hours or as directed by MD   melatonin 3 MG Tabs tablet Take 1 tablet (3 mg total) by mouth at bedtime.   oxyCODONE 10 mg 12 hr tablet--RX 28 pills Commonly known as: OXYCONTIN Take 1 tablet (10 mg total) by mouth every 12 (twelve) hours. Replaces: Oxycodone HCl 10 MG Tabs   polyethylene glycol 17 g packet Commonly known as: MIRALAX / GLYCOLAX Take 17 g by mouth daily. What changed:   when to take this  reasons to take this   prazosin 2 MG capsule Commonly known as: MINIPRESS Take 1 capsule (2 mg total) by mouth at bedtime.   senna-docusate 8.6-50 MG tablet Commonly known as: Senokot-S Take 2 tablets by mouth daily.       Follow-up Information    Lovorn, Aundra Millet, MD Follow up.   Specialty: Physical  Medicine and Rehabilitation Why: Office to call for appointment Contact information: [address] [place] [address] [phone]        Julio Sicks, MD Follow up.   Specialty: Neurosurgery Why: Call for appointment Contact information: [address] [place] [address] [phone]               Signed: Jacquelynn Cree 01/17/2020, 11:32 PM

## 2020-01-15 ENCOUNTER — Inpatient Hospital Stay: Payer: Self-pay | Admitting: Internal Medicine

## 2020-01-16 IMAGING — DX CHEST - 2 VIEW
2 series · 2 of 2 positions shown · non-contrast
Comparison: 10/10/2009.

CLINICAL DATA: Pain to low anterior left chest.

EXAM:
CHEST - 2 VIEW

[chest pa]
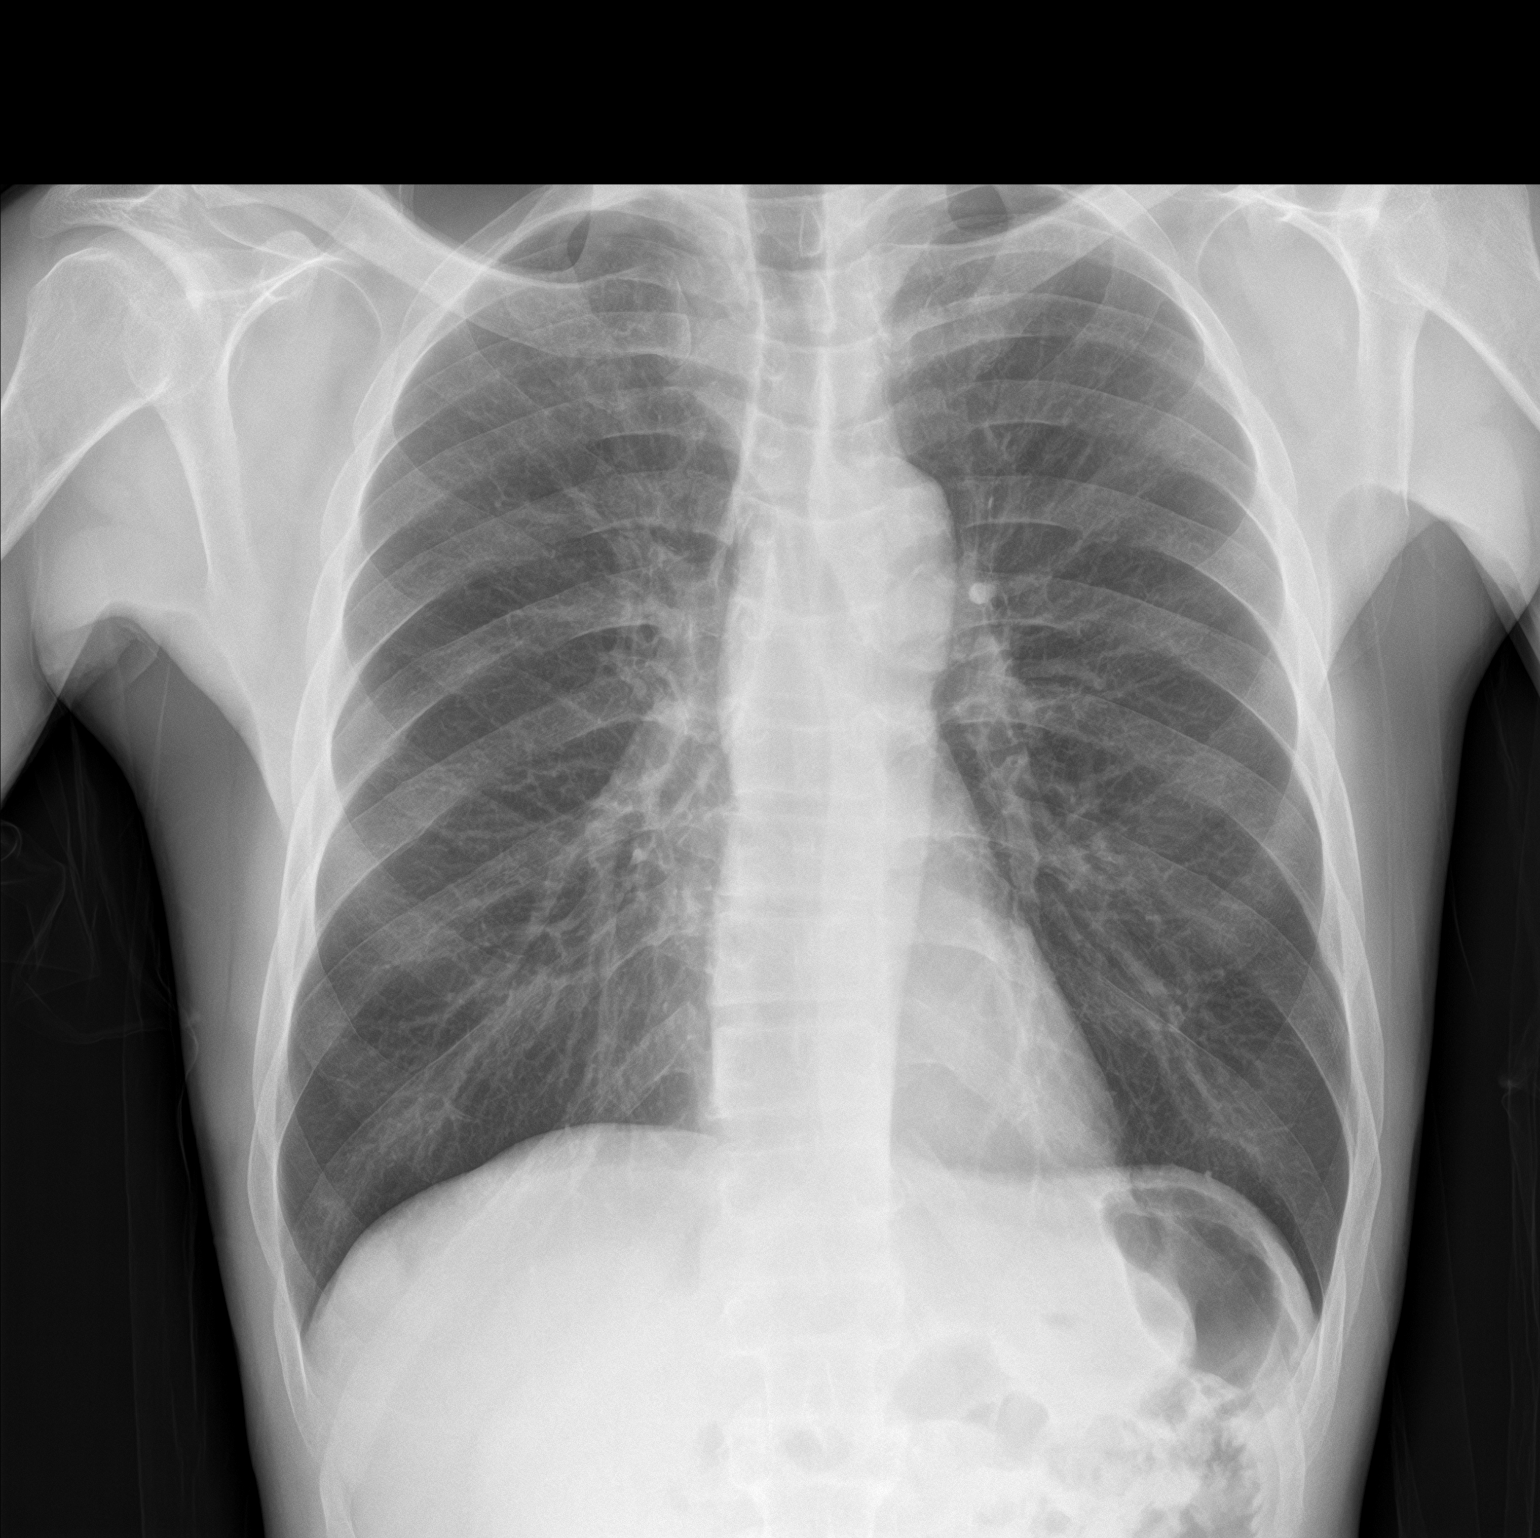

[chest lat]
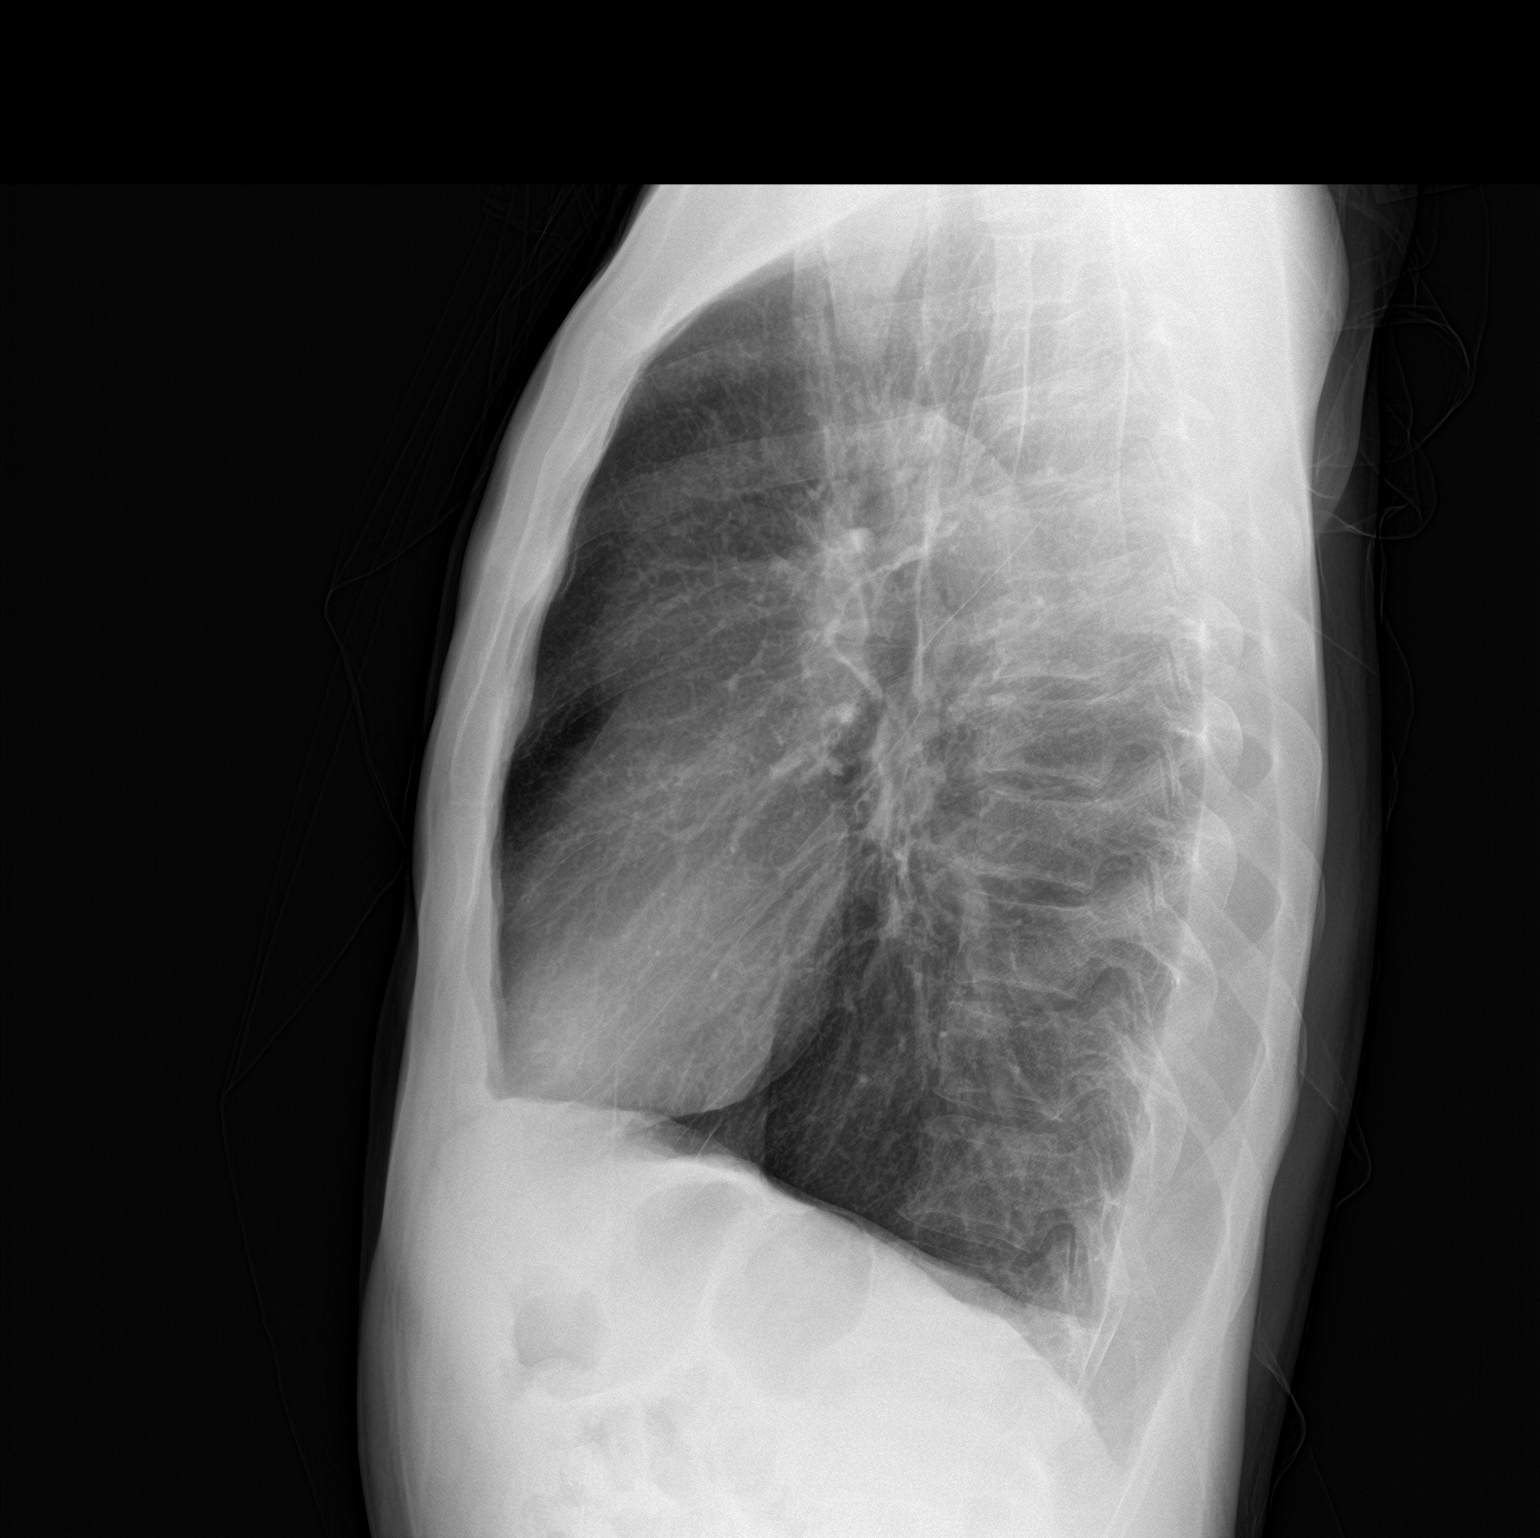

[2 of 2 positions shown; findings below may reference images not displayed]

FINDINGS: The heart size is normal. The lungs are hyperexpanded. There is no
pneumothorax. No large pleural effusion. No acute osseous
abnormality.
IMPRESSION: No active cardiopulmonary disease.

## 2020-01-18 ENCOUNTER — Telehealth: Payer: Self-pay

## 2020-01-18 ENCOUNTER — Other Ambulatory Visit: Payer: Self-pay

## 2020-01-18 ENCOUNTER — Encounter: Payer: Self-pay | Admitting: Physical Medicine and Rehabilitation

## 2020-01-18 ENCOUNTER — Encounter
Payer: Medicaid Other | Attending: Physical Medicine and Rehabilitation | Admitting: Physical Medicine and Rehabilitation

## 2020-01-18 VITALS — BP 122/80 | HR 93 | Temp 97.9°F | Ht 73.0 in | Wt 130.8 lb

## 2020-01-18 DIAGNOSIS — G629 Polyneuropathy, unspecified: Secondary | ICD-10-CM | POA: Diagnosis present

## 2020-01-18 DIAGNOSIS — G834 Cauda equina syndrome: Secondary | ICD-10-CM | POA: Diagnosis present

## 2020-01-18 DIAGNOSIS — S32001D Stable burst fracture of unspecified lumbar vertebra, subsequent encounter for fracture with routine healing: Secondary | ICD-10-CM

## 2020-01-18 DIAGNOSIS — R269 Unspecified abnormalities of gait and mobility: Secondary | ICD-10-CM | POA: Diagnosis present

## 2020-01-18 MED ORDER — GABAPENTIN 300 MG PO CAPS: 300.0000 mg | ORAL_CAPSULE | Freq: Three times a day (TID) | ORAL | 5 refills | Status: DC

## 2020-01-18 MED ORDER — BACLOFEN 5 MG PO TABS: 5.0000 mg | ORAL_TABLET | Freq: Three times a day (TID) | ORAL | 5 refills | Status: DC | PRN

## 2020-01-18 MED ORDER — PRAZOSIN HCL 2 MG PO CAPS: 2.0000 mg | ORAL_CAPSULE | Freq: Every day | ORAL | 5 refills | Status: DC

## 2020-01-18 MED ORDER — TRAMADOL HCL 50 MG PO TABS: 50.0000 mg | ORAL_TABLET | Freq: Four times a day (QID) | ORAL | 0 refills | Status: DC | PRN

## 2020-01-18 MED ORDER — OXYCODONE HCL ER 10 MG PO T12A: 10.0000 mg | EXTENDED_RELEASE_TABLET | Freq: Two times a day (BID) | ORAL | 0 refills | Status: DC

## 2020-01-18 NOTE — Telephone Encounter (Signed)
Patient called stating he was given 5 prescriptions today and they total over $200 and he can't afford it. I advised that he call other pharmacies to get other quotes on the medications. Call back if need to send to another pharmacy.

## 2020-01-18 NOTE — Patient Instructions (Signed)
Pt with Cauda Equina syndrome- at  L3 burst fx s/p lami/decompresison   1. Please continue Lovenox until done with the 30 days supply- then can stop- doesn't mean risk is NONE, means risk of DVT is LOW.   2. Reduce pain meds to Tramadol 50 mg q6 hours as needed OR 100 mg 2x/day as needed-  Gave 7 days prescription- when needs more, PLEASE CALL!!!  3. Oxycontin long acting pain medicine- try to wean to 1x/day for 2-3 weeks, then see if you can come off it.   4. Stop Cyclobenzaprine- and continue Baclofen 5-10 mg 3x/day as needed for muscle spasms.   5. Continue the Prazosin 2 mg nightly for sleep as needed- can get Melatonin over the counter if need it as well.   6. Lidocaine patches- in refills available over the counter- if needed  7. Continue Gabapentin 300 mg 3x/day for nerve pain.   8. Debrox 5-10 drops into ears up to 4 days at a time then clean out ears- is over the counter.  TO CLEAN OUT ears.   9. F/U in 2 months

## 2020-01-18 NOTE — Progress Notes (Signed)
Subjective:    Patient ID: Colton Nichols, male    DOB: [birth year], 54 y.o.   MRN: [id number]  HPI  Pt is a 54 yr old R handed male with hx of incomplete cauda equina syndrome and L3 burst fx s/p emergent decompressive lami and [place] decompression same day.    Still using the RW to get around- uses RW in the home.  R knee is weak and still will give out sometimes- !-2x/day- esp when first gets up in the AM-   RLE still weaker than LLE- uses LLE to- no actual falls- can catch self.   Bowels and bladder still going OK- no incontinent episodes.    Pain- pain on average 3-4/10 - esp if sitting for a long time-  Pulling on sides of back, esp when gets up from sitting too long.  Takes Flexeril when pulling/tightness- or sitting for too long.  It helps.  ~ 2x/day usually Only taking Oxycontin  1-2x/day-     Doing the Lovenox shots- hates them, but does every night.   Sleeping better on Prazosin- overall.    Social Hx: Living with Mother for now-  Does HEP 3x/day- and walks driveway 11 laps every day.  Has filed disability and SSI paperwork.   Pain Inventory Average Pain 5 Pain Right Now 5 My pain is sharp, burning and tingling  In the last 24 hours, has pain interfered with the following? General activity 3 Relation with others 0 Enjoyment of life 6 What TIME of day is your pain at its worst? morning  Sleep (in general) Fair  Pain is worse with: walking, standing and some activites Pain improves with: rest and medication Relief from Meds: 5  use a walker how many minutes can you walk? 30 ability to climb steps?  yes do you drive?  no  disabled: date disabled 12/19/2019 I need assistance with the following:  meal prep, household duties and shopping  weakness numbness tingling spasms  TC appt  TC appt    Family History  Adopted: Yes   Social History   Socioeconomic History  . Marital status: Single    Spouse name: Not on file  . Number of  children: Not on file  . Years of education: Not on file  . Highest education level: Not on file  Occupational History  . Not on file  Tobacco Use  . Smoking status: Current Every Day Smoker    Packs/day: 0.50    Types: Cigarettes  . Smokeless tobacco: Never Used  Substance and Sexual Activity  . Alcohol use: Yes    Comment: drinks a couple of beers daily  . Drug use: No  . Sexual activity: Not on file  Other Topics Concern  . Not on file  Social History Narrative  . Not on file   Social Determinants of Health   Financial Resource Strain:   . Difficulty of Paying Living Expenses: Not on file  Food Insecurity:   . Worried About Programme researcher, broadcasting/film/video in the Last Year: Not on file  . Ran Out of Food in the Last Year: Not on file  Transportation Needs:   . Lack of Transportation (Medical): Not on file  . Lack of Transportation (Non-Medical): Not on file  Physical Activity:   . Days of Exercise per Week: Not on file  . Minutes of Exercise per Session: Not on file  Stress:   . Feeling of Stress : Not on file  Social Connections:   .  Frequency of Communication with Friends and Family: Not on file  . Frequency of Social Gatherings with Friends and Family: Not on file  . Attends Religious Services: Not on file  . Active Member of Clubs or Organizations: Not on file  . Attends Banker Meetings: Not on file  . Marital Status: Not on file   Past Surgical History:  Procedure Laterality Date  . LACERATION REPAIR    . POSTERIOR LUMBAR FUSION 4 LEVEL N/A 12/19/2019   Procedure: Lumbar Three Decompressive Laminectomy with Transpedicular Decompression, Lumbar one - Lumbar five Posterior Lateral Arthrodesis utilizing segmental pedicle screw fixation and local autografting.;  Surgeon: Julio Sicks, MD;  Location: [facility] OR;  Service: Neurosurgery;  Laterality: N/A;   No past medical history on file. BP 122/80   Pulse 93   Temp 97.9 F (36.6 C)   Ht 6\' 1"  (1.854 m)   Wt 130 lb  12.8 oz (59.3 kg)   SpO2 98%   BMI 17.26 kg/m   Opioid Risk Score:   Fall Risk Score:  `1  Depression screen PHQ 2/9  Depression screen PHQ 2/9 01/18/2020  Decreased Interest 0  Down, Depressed, Hopeless 0  PHQ - 2 Score 0  Altered sleeping 0  Tired, decreased energy 1  Change in appetite 0  Feeling bad or failure about yourself  0  Trouble concentrating 0  Moving slowly or fidgety/restless 0  Suicidal thoughts 0  PHQ-9 Score 1  Difficult doing work/chores Not difficult at all   Review of Systems  Musculoskeletal: Positive for back pain and gait problem.       Spasms Foot pain  Neurological: Positive for weakness.       Tingling  All other systems reviewed and are negative.      Objective:   Physical Exam  Awake, alert, appropriate, using RW, NAD MS: RLE- HF 4-/5, KE 4/5, DF and PF 4+/5 LLE-  HF 4+/5, KE 4+/5, DF and PF 5-/5   Nerve pain/painful TTP over the top of R foot- where wearing lidoderm patch.      Assessment & Plan:   Pt with Cauda Equina syndrome- at  L3 burst fx s/p lami/decompresison   1. Please continue Lovenox until done with the 30 days supply- then can stop- doesn't mean risk is NONE, means risk of DVT is LOW.   2. Reduce pain meds to Tramadol 50 mg q6 hours as needed OR 100 mg 2x/day as needed-  Gave 7 days prescription- when needs more, PLEASE CALL!!!  3. Oxycontin long acting pain medicine- try to wean to 1x/day for 2-3 weeks, then see if you can come off it.   4. Stop Cyclobenzaprine- and continue Baclofen 5-10 mg 3x/day as needed for muscle spasms.   5. Continue the Prazosin 2 mg nightly for sleep as needed- can get Melatonin over the counter if need it as well.   6. Lidocaine patches- in refills available over the counter- if needed  7. Continue Gabapentin 300 mg 3x/day for nerve pain.   8. Debrox 5-10 drops into ears up to 4 days at a time then clean out ears- is over the counter.  TO CLEAN OUT ears.   9. F/U in 2  months  I spent a total of 35 minutes on appointment- as detailed above.

## 2020-01-21 ENCOUNTER — Other Ambulatory Visit: Payer: Self-pay

## 2020-01-21 ENCOUNTER — Telehealth: Payer: Self-pay

## 2020-01-21 MED ORDER — GABAPENTIN 300 MG PO CAPS: 300.0000 mg | ORAL_CAPSULE | Freq: Three times a day (TID) | ORAL | 5 refills | Status: DC

## 2020-01-21 MED ORDER — TRAMADOL HCL 50 MG PO TABS: 50.0000 mg | ORAL_TABLET | Freq: Four times a day (QID) | ORAL | 0 refills | Status: DC | PRN

## 2020-01-21 MED ORDER — BACLOFEN 5 MG PO TABS: 5.0000 mg | ORAL_TABLET | Freq: Three times a day (TID) | ORAL | 5 refills | Status: DC | PRN

## 2020-01-21 MED ORDER — PRAZOSIN HCL 2 MG PO CAPS: 2.0000 mg | ORAL_CAPSULE | Freq: Every day | ORAL | 5 refills | Status: DC

## 2020-01-21 MED FILL — traMADol HCL 50 MG TABS: 50 | 7 days supply | Qty: 28 | Fill #0

## 2020-01-21 MED FILL — BACLOFEN 10 MG TABLET: 10 | 15 days supply | Qty: 45 | Fill #0

## 2020-01-21 NOTE — Telephone Encounter (Signed)
Mr. Aime's Tramadol, Prazosin, Gabapentin & Baclofen was changed, to [facility].  He may get the scripts from $4-$10 each.   (The prior scripts have been called & stopped at [facility] ).

## 2020-01-27 NOTE — Progress Notes (Signed)
Patient ID: Colton Nichols, male   DOB: May 14, 1966, 54 y.o.   MRN: [id number]    Virtual Visit via Telephone Note  I connected with Colton Nichols on 01/28/20 at  9:50 AM EDT by telephone and verified that I am speaking with the correct person using two identifiers.   I discussed the limitations, risks, security and privacy concerns of performing an evaluation and management service by telephone and the availability of in person appointments. I also discussed with the patient that there may be a patient responsible charge related to this service. The patient expressed understanding and agreed to proceed.   PATIENT visit by telephone virtually in the context of Covid-19 pandemic. Patient location:  home My Location:  [facility] office Persons on the call:  Me and the patient    History of Present Illness: After hospitalization and physical rehab(8/3-8/19/2021) subsequent to a fall.  He is improving slowly but is almost able to perform all ADL.  He has a lot of questions about home PT, etc.  He needs to pick up new prescriptions Dr Berline Chough sent.  No fever.  Bowels moving ok but slower since on iron and pain meds  From physical rehab discharge visit: Brief HPI:   Colton Nichols is a 54 y.o. male in relatively good health was admitted on 12/19/2019 after a fall at work.  He struck the back of his head and landed on buttocks and developed severe low back pain shooting to BLE as well as fecal incontinence.  He was found to have L3 burst fracture with 1 cm retropulsion and pronounced canal compromise with fracture of bilateral spinous processes.  He was evaluated by Dr. Dutch Quint and found to have mild sensory deficits in L4/5 and S1 dermatomes as well as cauda equina symptoms.  He was taken to the OR for emergent L3 decompressive laminectomy with decompression of spinal canal for open reduction L3 fracture on the same day.  Follow-up labs revealed ABLA as well as thrombocytopenia.  He continued to have  RLE greater than LLE weakness with sensory deficits, neuropathy and pain affecting ADLs and mobility.  CIR was recommended due to functional decline.   Hospital Course: Colton Nichols was admitted to rehab 12/22/2019 for inpatient therapies to consist of PT and OT at least three hours five days a week. Past admission physiatrist, therapy team and rehab RN have worked together to provide customized collaborative inpatient rehab.  Pain was poorly controlled at admission therefore Oxycontin was added for more consistent pain control. Gabapentin was added to help manage neuropathy and titrated up to 300 mg tid without SE. Baclofen has been used to control spasticity. Back incision is C/D/I and healing well.  Check of lytes showed that hyponatremia and hypomagnesemia as well as stress induced hyperglycemia has resolved. Bowel program was started and augmented to help manage constipation as patient refused any suppositories or enema. He is continent of bowel and bladder and is voiding without signs of retention.   Serial CBC showed that thrombocytopenia has resolved and SQ Lovenox added on 08/16. Marland Kitchen He was instructed to continue Lovenox for addition month to complete his DVT prophylaxis course.  Reactive leucocytosis has been monitored with serial checks and has resolved. ABLA is slowly improving with addition of iron supplement. Prazosin was added to help with poor sleep with PTSD type symptoms at bedtime. His blood pressures were monitored on TID basis and has been controlled. [facility] will provide HH[facility], HHOT and HHRN after  discharge.    Rehab course: During patient's stay in rehab weekly team conferences were held to monitor patient's progress, set goals and discuss barriers to discharge. At admission, patient required min assist with mobility and with basic self care tasks. He has had improvement in activity tolerance, balance, postural control as well as ability to compensate for deficits. He is  modified independent for transfers and to ambulate 1000' with RW. He is able to climb 12 stairs at modified independent level. No family education needed due to patient's level of independence.    Observations/Objective:  NAD.  A&Ox3   Assessment and Plan: 1. Cauda equina syndrome (HCC) Advised to call and discuss questions regarding home exercises and limitations since he has questions and is uncertain what is safe for him or helpful for his recovery.    2. Acute blood loss anemia Continue iron.  Drink 80-100 ounces water daily - CBC with Differential/Platelet; Future - Comprehensive metabolic panel; Future - Iron, TIBC and Ferritin Panel; Future  3. Neuropathy Improving some  4. Hospital discharge follow-up Improving  5. Electrolyte abnormality Likely resolving - Comprehensive metabolic panel; Future    Follow Up Instructions: Assign PCP in 6 weeks   I discussed the assessment and treatment plan with the patient. The patient was provided an opportunity to ask questions and all were answered. The patient agreed with the plan and demonstrated an understanding of the instructions.   The patient was advised to call back or seek an in-person evaluation if the symptoms worsen or if the condition fails to improve as anticipated.  I provided 16 minutes of non-face-to-face time during this encounter.   Georgian Co, PA-C

## 2020-01-28 ENCOUNTER — Ambulatory Visit: Payer: Self-pay | Attending: Physician Assistant | Admitting: Physician Assistant

## 2020-01-28 DIAGNOSIS — D62 Acute posthemorrhagic anemia: Secondary | ICD-10-CM

## 2020-01-28 DIAGNOSIS — E878 Other disorders of electrolyte and fluid balance, not elsewhere classified: Secondary | ICD-10-CM

## 2020-01-28 DIAGNOSIS — Z09 Encounter for follow-up examination after completed treatment for conditions other than malignant neoplasm: Secondary | ICD-10-CM

## 2020-01-28 DIAGNOSIS — G629 Polyneuropathy, unspecified: Secondary | ICD-10-CM

## 2020-01-28 DIAGNOSIS — G834 Cauda equina syndrome: Secondary | ICD-10-CM

## 2020-01-28 MED ORDER — CYCLOBENZAPRINE HCL 5 MG PO TABS: ORAL_TABLET | ORAL | 1 refills | Status: DC

## 2020-03-11 ENCOUNTER — Other Ambulatory Visit: Payer: Self-pay

## 2020-03-14 ENCOUNTER — Encounter
Payer: Medicaid Other | Attending: Physical Medicine and Rehabilitation | Admitting: Physical Medicine and Rehabilitation

## 2020-03-14 ENCOUNTER — Encounter: Payer: Self-pay | Admitting: Physical Medicine and Rehabilitation

## 2020-03-14 ENCOUNTER — Other Ambulatory Visit: Payer: Self-pay

## 2020-03-14 ENCOUNTER — Other Ambulatory Visit: Payer: Self-pay | Admitting: Physical Medicine and Rehabilitation

## 2020-03-14 VITALS — BP 147/88 | HR 105 | Temp 98.6°F | Ht 73.0 in | Wt 151.6 lb

## 2020-03-14 DIAGNOSIS — G629 Polyneuropathy, unspecified: Secondary | ICD-10-CM | POA: Insufficient documentation

## 2020-03-14 DIAGNOSIS — Z5181 Encounter for therapeutic drug level monitoring: Secondary | ICD-10-CM | POA: Diagnosis present

## 2020-03-14 DIAGNOSIS — R269 Unspecified abnormalities of gait and mobility: Secondary | ICD-10-CM | POA: Diagnosis present

## 2020-03-14 DIAGNOSIS — G894 Chronic pain syndrome: Secondary | ICD-10-CM | POA: Diagnosis present

## 2020-03-14 DIAGNOSIS — G834 Cauda equina syndrome: Secondary | ICD-10-CM | POA: Insufficient documentation

## 2020-03-14 MED ORDER — BACLOFEN 5 MG PO TABS: 5.0000 mg | ORAL_TABLET | Freq: Three times a day (TID) | ORAL | 5 refills | Status: DC | PRN

## 2020-03-14 MED ORDER — PRAZOSIN HCL 2 MG PO CAPS: 2.0000 mg | ORAL_CAPSULE | Freq: Every day | ORAL | 5 refills | Status: DC

## 2020-03-14 MED ORDER — TRAMADOL HCL 50 MG PO TABS: 50.0000 mg | ORAL_TABLET | Freq: Four times a day (QID) | ORAL | 1 refills | Status: DC | PRN

## 2020-03-14 MED ORDER — GABAPENTIN 300 MG PO CAPS: 300.0000 mg | ORAL_CAPSULE | Freq: Three times a day (TID) | ORAL | 5 refills | Status: DC

## 2020-03-14 MED FILL — GABAPENTIN 300 MG CAPSULE: 300 | 30 days supply | Qty: 90 | Fill #0

## 2020-03-14 MED FILL — ?PRAZOSIN 2 MG CAPSULE: 2 | 30 days supply | Qty: 30 | Fill #0

## 2020-03-14 MED FILL — BACLOFEN 5 MG TABS: 5 | 14 days supply | Qty: 85 | Fill #0

## 2020-03-14 NOTE — Patient Instructions (Signed)
  1. Trying to get Medicaid figured out- ask if hospital can help with covering costs.   2. Renew Prazosin 2 mg nightly for sleep/PTSD symptoms- [facility]- 5 RFs  3. Renew Baclofen 5-10 mg 3x/day for muscle spasms- sent to [facility] and wellness-  5 RFs  4. Tramadol 50 mg every 6 hours as needed- for pain. Sent to [facility]-  Pyramid 1 RF  5. Restart Gabapentin - 300 mg nightly x 3 days- then 300 mg 3x/day- after that- for nerve pain.  Won't help cold feeling, only burning.    6. Still would use the RW-rolling walker outside the house for now- don't want you to fall or getting injured again.   7. We will do UDS/testing next visit when taking meds.   8. F/U in 3 months. Can call for refills if needed.

## 2020-03-14 NOTE — Progress Notes (Signed)
Subjective:    Patient ID: Colton Nichols, male    DOB: 11-03-1965, 54 y.o.   MRN: [id number]  HPI Pt with Cauda Equina syndrome- at  L3 burst fx s/p lami/decompresison here for f/u.    Has filled Medicaid- per pt.  Supposedly SSN has been seen with "money laundering".  So being looked at for [facility], but detective involved in case.     Still staying with mother since cost, etc.    Hasn't been taking any meds- since ran out funds to pay for them.   Can go to [facility] for Prazosin and Baclofen.  Main issue is pain- couldn't afford to get anymore.  Going to see Neurologist/[facility] on 10/28-   Feels like plate on back- feels like pushing up back- makes back and legs numb- ice cold legs to touch but feels burning on inside.   Arms are sore in the morning and feels cold to touch.  Esp shoulders.   Taking ibuprofen that he had. They only last so long.   Describing nerve pain.     Pain Inventory Average Pain 8 Pain Right Now 7 My pain is sharp, burning, dull and aching  In the last 24 hours, has pain interfered with the following? General activity 7 Relation with others 6 Enjoyment of life 8 What TIME of day is your pain at its worst? morning  Sleep (in general) Fair  Pain is worse with: walking, sitting, standing and some activites Pain improves with: rest Relief from Meds: No medication taken.  Family History  Adopted: Yes   Social History   Socioeconomic History  . Marital status: Single    Spouse name: Not on file  . Number of children: Not on file  . Years of education: Not on file  . Highest education level: Not on file  Occupational History  . Not on file  Tobacco Use  . Smoking status: Former Smoker    Packs/day: 0.50    Types: Cigarettes  . Smokeless tobacco: Never Used  Substance and Sexual Activity  . Alcohol use: Yes    Comment: drinks a couple of beers daily  . Drug use: No  . Sexual activity: Not on file  Other  Topics Concern  . Not on file  Social History Narrative  . Not on file   Social Determinants of Health   Financial Resource Strain:   . Difficulty of Paying Living Expenses: Not on file  Food Insecurity:   . Worried About Programme researcher, broadcasting/film/video in the Last Year: Not on file  . Ran Out of Food in the Last Year: Not on file  Transportation Needs:   . Lack of Transportation (Medical): Not on file  . Lack of Transportation (Non-Medical): Not on file  Physical Activity:   . Days of Exercise per Week: Not on file  . Minutes of Exercise per Session: Not on file  Stress:   . Feeling of Stress : Not on file  Social Connections:   . Frequency of Communication with Friends and Family: Not on file  . Frequency of Social Gatherings with Friends and Family: Not on file  . Attends Religious Services: Not on file  . Active Member of Clubs or Organizations: Not on file  . Attends Banker Meetings: Not on file  . Marital Status: Not on file   Past Surgical History:  Procedure Laterality Date  . LACERATION REPAIR    . POSTERIOR LUMBAR FUSION 4 LEVEL  N/A 12/19/2019   Procedure: Lumbar Three Decompressive Laminectomy with Transpedicular Decompression, Lumbar one - Lumbar five Posterior Lateral Arthrodesis utilizing segmental pedicle screw fixation and local autografting.;  Surgeon: Julio Sicks, MD;  Location: [facility] OR;  Service: Neurosurgery;  Laterality: N/A;   Past Surgical History:  Procedure Laterality Date  . LACERATION REPAIR    . POSTERIOR LUMBAR FUSION 4 LEVEL N/A 12/19/2019   Procedure: Lumbar Three Decompressive Laminectomy with Transpedicular Decompression, Lumbar one - Lumbar five Posterior Lateral Arthrodesis utilizing segmental pedicle screw fixation and local autografting.;  Surgeon: Julio Sicks, MD;  Location: [facility] OR;  Service: Neurosurgery;  Laterality: N/A;   History reviewed. No pertinent past medical history. BP (!) 147/88   Pulse (!) 105   Temp 98.6 F (37 C)   Ht 6'  1" (1.854 m)   Wt 151 lb 9.6 oz (68.8 kg)   SpO2 98%   BMI 20.00 kg/m   Opioid Risk Score:   Fall Risk Score:  `1  Depression screen PHQ 2/9  Depression screen PHQ 2/9 01/18/2020  Decreased Interest 0  Down, Depressed, Hopeless 0  PHQ - 2 Score 0  Altered sleeping 0  Tired, decreased energy 1  Change in appetite 0  Feeling bad or failure about yourself  0  Trouble concentrating 0  Moving slowly or fidgety/restless 0  Suicidal thoughts 0  PHQ-9 Score 1  Difficult doing work/chores Not difficult at all   Review of Systems  Musculoskeletal: Positive for back pain and gait problem.  an entire ROS was completed and found ot be negative except HPI.       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 Trying to get Medicaid figured out- ask if hospital can help with covering costs.   2. Renew Prazosin 2 mg nightly for sleep/PTSD symptoms- [facility]- 5 RFs  3. Renew Baclofen 5-10 mg 3x/day for muscle spasms- sent to [facility]-  5 RFs  4. Tramadol 50 mg every 6 hours as needed- for pain. Sent to [facility]-  Pyramid 1 RF  5. Restart Gabapentin - 300 mg nightly x 3 days- then 300 mg 3x/day- after that- for nerve pain.  Won't help cold feeling, only burning.    6. Still would use the RW-rolling walker outside the house for now- don't want you to fall or getting injured again.   7. We will do UDS/testing next visit when taking meds.   8. F/U in 3 months. Can call for refills if needed.   I spent a total of 25 minutes on visit- as detailed above.

## 2020-04-13 ENCOUNTER — Ambulatory Visit: Payer: MEDICAID | Attending: Neurosurgery

## 2020-05-04 ENCOUNTER — Ambulatory Visit: Payer: Medicaid Other | Admitting: Rehabilitative and Restorative Service Providers"

## 2020-05-04 ENCOUNTER — Telehealth: Payer: Self-pay | Admitting: General Practice

## 2020-05-04 MED FILL — ?PRAZOSIN 2 MG CAPSULE: 2 | 30 days supply | Qty: 30 | Fill #1

## 2020-05-04 MED FILL — BACLOFEN 5 MG TABS: 5 | 14 days supply | Qty: 85 | Fill #1

## 2020-05-04 MED FILL — GABAPENTIN 300 MG CAPSULE: 300 | 30 days supply | Qty: 90 | Fill #1

## 2020-05-04 NOTE — Telephone Encounter (Signed)
°   Colton Nichols DOB: 1965-09-15 MRN: [id number]   RIDER WAIVER AND RELEASE OF LIABILITY  For purposes of improving physical access to our facilities, Colton Nichols is pleased to partner with third parties to provide Colton Nichols patients or other authorized individuals the option of convenient, on-demand ground transportation Nichols (the Colton Nichols) through use of the technology service that enables users to request on-demand ground transportation from independent third-party providers.  By opting to use and accept these Colton Nichols, I, the undersigned, hereby agree on behalf of myself, and on behalf of any minor child using the Colton Nichols for whom I am the parent or legal guardian, as follows:  1. Colton Nichols provided to me are provided by independent third-party transportation providers who are not Colton Nichols or employees and who are unaffiliated with Colton Nichols. 2. Colton Nichols is neither a transportation carrier nor a common or public carrier. 3. Colton Nichols has no control over the quality or safety of the transportation that occurs as a result of the Colton Nichols. 4. Colton Nichols cannot guarantee that any third-party transportation provider will complete any arranged transportation service. 5. Colton Nichols makes no representation, warranty, or guarantee regarding the reliability, timeliness, quality, safety, suitability, or availability of any of the Colton Nichols or that they will be error free. 6. I fully understand that traveling by vehicle involves risks and dangers of serious bodily injury, including permanent disability, paralysis, and death. I agree, on behalf of myself and on behalf of any minor child using the Colton Nichols for whom I am the parent or legal guardian, that the entire risk arising out of my use of the Colton Nichols remains solely with me, to the maximum extent permitted under applicable law. 7. The Colton Nichols are provided as is and as available. Colton Nichols disclaims all representations and warranties, express, implied or statutory, not expressly set out in these terms, including the implied warranties of merchantability and fitness for a particular purpose. 8. I hereby waive and release Colton Nichols, its agents, employees, officers, directors, representatives, insurers, attorneys, assigns, successors, subsidiaries, and affiliates from any and all past, present, or future claims, demands, liabilities, actions, causes of action, or suits of any kind directly or indirectly arising from acceptance and use of the Colton Nichols. 9. I further waive and release  and its affiliates from all present and future liability and responsibility for any injury or death to persons or damages to property caused by or related to the use of the Colton Nichols. 10. I have read this Waiver and Release of Liability, and I understand the terms used in it and their legal significance. This Waiver is freely and voluntarily given with the understanding that my right (as well as the right of any minor child for whom I am the parent or legal guardian using the Colton Nichols) to legal recourse against  in connection with the Colton Nichols is knowingly surrendered in return for use of these Nichols.   I attest that I read the consent document to Colton Nichols, gave Colton Nichols the opportunity to ask questions and answered the questions asked (if any). I affirm that Colton Nichols then provided consent for he's participation in this program.     Colton Nichols

## 2020-05-19 ENCOUNTER — Ambulatory Visit: Payer: Medicaid Other | Admitting: Rehabilitative and Restorative Service Providers"

## 2020-06-02 ENCOUNTER — Ambulatory Visit: Payer: Medicaid Other | Attending: Neurosurgery | Admitting: Physical Therapy

## 2020-06-02 ENCOUNTER — Other Ambulatory Visit: Payer: Self-pay

## 2020-06-02 ENCOUNTER — Ambulatory Visit: Payer: Medicaid Other

## 2020-06-02 ENCOUNTER — Encounter: Payer: Self-pay | Admitting: Physical Therapy

## 2020-06-02 DIAGNOSIS — G834 Cauda equina syndrome: Secondary | ICD-10-CM | POA: Diagnosis present

## 2020-06-02 DIAGNOSIS — M545 Low back pain, unspecified: Secondary | ICD-10-CM | POA: Diagnosis not present

## 2020-06-02 DIAGNOSIS — G8929 Other chronic pain: Secondary | ICD-10-CM | POA: Insufficient documentation

## 2020-06-02 DIAGNOSIS — M62830 Muscle spasm of back: Secondary | ICD-10-CM | POA: Insufficient documentation

## 2020-06-02 DIAGNOSIS — R2689 Other abnormalities of gait and mobility: Secondary | ICD-10-CM | POA: Insufficient documentation

## 2020-06-02 DIAGNOSIS — S32032D Unstable burst fracture of third lumbar vertebra, subsequent encounter for fracture with routine healing: Secondary | ICD-10-CM | POA: Diagnosis present

## 2020-06-03 ENCOUNTER — Encounter: Payer: Self-pay | Admitting: Physical Therapy

## 2020-06-03 NOTE — Addendum Note (Signed)
Addended by: Dessie Coma on: 06/03/2020 01:40 PM   Modules accepted: Orders

## 2020-06-03 NOTE — Therapy (Signed)
[facility] [address] [place], [place], [address] Phone: [phone]   Fax:  [phone]  Physical Therapy Evaluation  Patient Details  Name: Colton BangsSteven H Nichols MRN: [id number] Date of Birth: January 10, 1966 Referring Provider (PT): Dr Gentry FitzHenery Pool   Encounter Date: 06/02/2020   PT End of Session - 06/03/20 1316    Visit Number 1    Number of Visits 16    Date for PT Re-Evaluation 07/15/20    Authorization Type medicaid or [facility] ?    PT Start Time 1630    PT Stop Time 1710    PT Time Calculation (min) 40 min    Activity Tolerance Patient tolerated treatment well    Behavior During Therapy WFL for tasks assessed/performed           History reviewed. No pertinent past medical history.  Past Surgical History:  Procedure Laterality Date  . LACERATION REPAIR    . POSTERIOR LUMBAR FUSION 4 LEVEL N/A 12/19/2019   Procedure: Lumbar Three Decompressive Laminectomy with Transpedicular Decompression, Lumbar one - Lumbar five Posterior Lateral Arthrodesis utilizing segmental pedicle screw fixation and local autografting.;  Surgeon: Julio SicksPool, Henry, MD;  [facility];  Service: Neurosurgery;  Laterality: N/A;    There were no vitals filed for this visit.    Subjective Assessment - 06/02/20 1636    Subjective Patient fell on 12/19/2019 and suffered an L3 Burst fracture. He had an L1-L5 fusion. He went to rehab untiul August 31st and was sdischarged home He was supposed to have rhome heatlth but no one has come out. His C/O is pain in his lower back in his paraspianls and into his groin. He has burning into his groin. He also has pain, stiffness, and tightness in his arms. He is currently using a walker    Pertinent History L3 burstfracture    How long can you sit comfortably? He can sit for about 20 minutes before the pain starts to go doen his legs and he has numbness    How long can you stand comfortably? about 20 minute before he  feels it. He can stand for 10 more minutes then needs to sit    How long can you walk comfortably? walks with around the store but has to stop at times    Diagnostic tests x-ray: Fusion from L1-L5    Patient Stated Goals get off the walker if possible;    Currently in Pain? Yes    Pain Score 3    can reach a 10/10 n   Pain Location Back    Pain Orientation Right;Left    Pain Type Surgical pain    Pain Radiating Towards into the groin    Pain Onset More than a month ago    Pain Frequency Constant    Aggravating Factors  any prolonged positiong    Pain Relieving Factors Standing and walking    Effect of Pain on Daily Activities difficulty perfroming daily activity              [facility] PT Assessment - 06/03/20 0001      Assessment   Medical Diagnosis L3 Burst fracture subsequent L1-L5 fusion    Referring Provider (PT) Dr Dawna PartHenery Pool    Onset Date/Surgical Date 12/19/19    Hand Dominance Right    Next MD Visit 06/19/2020 also sees Dr Alvera SinghLoveorn on the 26th    Prior Therapy Had inpatient rehab      Precautions   Precautions None  Restrictions   Weight Bearing Restrictions No      Balance Screen   Has the patient fallen in the past 6 months No    Has the patient had a decrease in activity level because of a fear of falling?  No    Is the patient reluctant to leave their home because of a fear of falling?  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    Leisure walking      Cognition   Overall Cognitive Status Within Functional Limits for tasks assessed    Attention Focused    Focused Attention Appears intact    Memory Appears intact    Awareness Appears intact    Problem Solving Appears intact      Observation/Other Assessments   Focus on Therapeutic Outcomes (FOTO)   Medicaid      Sensation   Light Touch Appears Intact    Additional Comments can have numbenss into the lower legs      Coordination   Gross Motor Movements are Fluid and Coordinated Yes    Fine Motor Movements are Fluid and Coordinated Yes      Functional Tests   Functional tests Squat      Squat   Comments can bend forward slightly. When he needs to pick something up he goes down on 1 knee      Posture/Postural Control   Posture Comments standis with flexed trunk      AROM   Right Shoulder Flexion 90 Degrees    Right Shoulder Internal Rotation --   can reach bilateral gluteal   Right Shoulder External Rotation --   can only each to the fornt of his head   Left Shoulder Flexion 90 Degrees      Strength   Right Hand Grip (lbs) 45    Left Hand Grip (lbs) 45    Right Hip Flexion 4/5    Right Hip ABduction 4/5    Right Hip ADduction 4+/5    Left Hip Flexion 4/5    Left Hip ABduction 4+/5    Left Hip ADduction 4+/5    Right Knee Flexion 5/5    Right Knee Extension 4+/5    Left Knee Flexion 5/5    Left Knee Extension 5/5      Palpation   Palpation comment spasming along bilateral paraspinals      Ambulation/Gait   Gait Comments ambulates with flexed turnk with weight on the hands;                      Objective measurements completed on examination: See above findings.       [facility] Adult PT Treatment/Exercise - 06/03/20 0001      Exercises   Exercises Lumbar      Lumbar Exercises: Stretches   Active Hamstring Stretch Limitations reviewed seated with emphasis on postutre 3x20 sec hold    Lower Trunk Rotation Limitations x20    Piriformis Stretch Limitations 3x20 sec hold bilateral    Other Lumbar Stretch Exercise tennis ball release to lumbar spine                  PT Education - 06/02/20 1649    Person(s) Educated Patient  Methods Explanation;Demonstration;Tactile cues;Verbal cues    Comprehension Verbalized understanding;Returned  demonstration;Tactile cues required;Verbal cues required            PT Short Term Goals - 06/03/20 1325      PT SHORT TERM GOAL #1   Title Patient will increase gross bilateral LE strength to 4+/5    Baseline 4/5 right hip flexion and abduction 4/5 left hip abduction    Time 3    Period Weeks    Status New    Target Date 06/24/20      PT SHORT TERM GOAL #2   Title Patient will increase bilateral grip strength to 60 lbs each hand    Baseline 40 right 40 left    Time 3    Period Weeks    Status New    Target Date 06/24/20      PT SHORT TERM GOAL #3   Title Patient will increase bilateral active shoulder flexion to 130 degrees    Baseline 90 bilateral    Time 3    Period Weeks    Status New    Target Date 06/24/20             PT Long Term Goals - 06/03/20 1326      PT LONG TERM GOAL #1   Title Patient will stand for 20 minutes with LRAD without increased pain in his back or groin    Baseline signifcant burning and pain after 20 min    Time 3    Period Weeks    Status New    Target Date 06/24/20      PT LONG TERM GOAL #2   Title Patient will bend down with proper technqiue and pick an item off the ground without pain.    Baseline difficulty picking thisgs up. has to get down on one knee sometimes    Time 6    Period Weeks    Status New    Target Date 07/15/20                  Plan - 06/03/20 1318    Clinical Impression Statement Patient is a 55 year old male. On 12/18/2020 he fell and suffered a l3 burst fx. He had an L1 to L5 spinal fusion. He went to rehab until the end of August. Since that point he has been doing home exercises. He has signifcant tightness in lumbar paraspinals. He has difficulty bennding forward. He has groin pain and burning inhis legs with prlonged standing and sitting. He has bilateral LE weakness R>L. Heis using a walker for primary mobility. he would like to progress to a cane. He would benefit from skilled therapy to improve  ability to perfrom ADL's, ambualte, and to pick items off the ground.    Personal Factors and Comorbidities Time since onset of injury/illness/exacerbation    Examination-Activity Limitations Locomotion Level;Bend;Carry;Lift;Stairs;Stand;Squat;Sleep    Examination-Participation Restrictions Cleaning;Occupation;Laundry;Shop    Stability/Clinical Decision Making Evolving/Moderate complexity   continued radicualr symptoms with positioning   Clinical Decision Making Moderate    Rehab Potential Excellent    PT Frequency 1x / week    PT Duration 6 weeks    PT Treatment/Interventions ADLs/Self Care Home Management;Electrical Stimulation;Cryotherapy;Iontophoresis 4mg /ml Dexamethasone;Moist Heat;Traction;Ultrasound;DME Instruction;Gait training;Stair training;Functional mobility training;Therapeutic activities;Therapeutic exercise;Neuromuscular re-education;Patient/family education;Manual techniques;Passive range of motion;Dry needling;Taping    PT Next Visit Plan soft tissue mobilization to lumbar spine; git training; review stretches, consder thomas stretch; review which exercises he is doing at home already;  consdider supine march, consider clamshell; Nu-step    PT Home Exercise Plan LTR; prifriofrmis sttretch; tennis ball release; ahmstring stretch    Consulted and Agree with Plan of Care Patient           Patient will benefit from skilled therapeutic intervention in order to improve the following deficits and impairments:  Abnormal gait,Difficulty walking,Decreased range of motion,Increased fascial restricitons,Decreased endurance,Increased muscle spasms,Pain,Decreased activity tolerance,Decreased strength,Improper body mechanics,Postural dysfunction  Visit Diagnosis: Chronic bilateral low back pain without sciatica  Cauda equina syndrome (HCC)  Closed unstable burst fracture of third lumbar vertebra with routine healing, subsequent encounter  Muscle spasm of back  Other abnormalities of  gait and mobility     Problem List Patient Active Problem List   Diagnosis Date Noted  . Impaired gait 01/18/2020  . Acute blood loss anemia 01/12/2020  . Neuropathy 01/12/2020  . Cauda equina syndrome (HCC) 12/22/2019  . Burst fracture of lumbar vertebra (HCC) 12/19/2019  . Surgery follow-up 12/19/2019  . Burst fracture of lumbar vertebra, closed, initial encounter (HCC) 12/19/2019    Dessie Coma PT DPT  06/03/2020, 1:35 PM  [facility] [address] [place], [place], [address] Phone: [phone]   Fax:  [phone]  Name: Colton Nichols MRN: [id number] Date of Birth: 01-14-1966

## 2020-06-03 NOTE — Patient Instructions (Signed)
Access Code: [email or web address]: [email or web address]: 01/14/2022Prepared by: Onalee Hua CarrollExercises  Seated Hamstring Stretch - 2 x daily - 7 x weekly - 1 sets - 3 reps - 20 hold  Supine Piriformis Stretch with Foot on Ground - 1 x daily - 7 x weekly - 3 sets - 3 reps - 20 hold  Standing Glute Med Mobilization with Small Ball on Wall - 1 x daily - 7 x weekly - 3 sets - 10 reps  Supine Lower Trunk Rotation - 1 x daily - 7 x weekly - 3 sets - 10 reps - 5 sec hold

## 2020-06-08 ENCOUNTER — Telehealth: Payer: Self-pay

## 2020-06-08 NOTE — Telephone Encounter (Signed)
Have called pt and let him know tramadol doesn't cause these type of breathing issues- if overdoses, can cause issues, but not otherwise.  Asked him to see/talk with PCP before we decide what to do with tramadol Rx- thank you, ML

## 2020-06-08 NOTE — Telephone Encounter (Signed)
Colton Nichols started wheezing while taking Tramadol. Per patient he stopped the Tramadol. Thinking he was having a reaction to the Tramadol. But he is still wheezing and coughing up mucus with black specks.    Patient advised to see his PCP or Urgent Care. Since there is mucus and cold symptoms. However Dr. Berline Chough will be informed.   Please advise [phone]

## 2020-06-15 ENCOUNTER — Encounter: Payer: Self-pay | Admitting: Physical Medicine and Rehabilitation

## 2020-06-23 MED FILL — GABAPENTIN 300 MG CAPSULE: 300 | 30 days supply | Qty: 90 | Fill #2

## 2020-06-23 MED FILL — ?PRAZOSIN 2 MG CAPSULE: 2 | 30 days supply | Qty: 30 | Fill #2

## 2020-06-23 MED FILL — BACLOFEN 5 MG TABS: 5 | 14 days supply | Qty: 85 | Fill #2

## 2020-07-07 ENCOUNTER — Ambulatory Visit: Payer: MEDICAID | Attending: Neurosurgery | Admitting: Physical Therapy

## 2020-07-11 ENCOUNTER — Encounter
Payer: Medicaid Other | Attending: Physical Medicine and Rehabilitation | Admitting: Physical Medicine and Rehabilitation

## 2020-07-11 ENCOUNTER — Encounter: Payer: Self-pay | Admitting: Physical Medicine and Rehabilitation

## 2020-07-11 ENCOUNTER — Other Ambulatory Visit: Payer: Self-pay

## 2020-07-11 VITALS — BP 137/96 | HR 93 | Temp 98.7°F | Ht 73.0 in | Wt 169.4 lb

## 2020-07-11 DIAGNOSIS — G629 Polyneuropathy, unspecified: Secondary | ICD-10-CM

## 2020-07-11 DIAGNOSIS — R269 Unspecified abnormalities of gait and mobility: Secondary | ICD-10-CM

## 2020-07-11 DIAGNOSIS — G834 Cauda equina syndrome: Secondary | ICD-10-CM

## 2020-07-11 DIAGNOSIS — M792 Neuralgia and neuritis, unspecified: Secondary | ICD-10-CM | POA: Diagnosis not present

## 2020-07-11 MED ORDER — GABAPENTIN 600 MG PO TABS: 600.0000 mg | ORAL_TABLET | Freq: Two times a day (BID) | ORAL | 5 refills | Status: DC

## 2020-07-11 MED ORDER — TRAZODONE HCL 50 MG PO TABS: 50.0000 mg | ORAL_TABLET | Freq: Every day | ORAL | 5 refills | Status: DC

## 2020-07-11 MED ORDER — BACLOFEN 5 MG PO TABS: 5.0000 mg | ORAL_TABLET | Freq: Three times a day (TID) | ORAL | 5 refills | Status: DC | PRN

## 2020-07-11 MED FILL — GABAPENTIN 600 MG TABLET: 600 | 30 days supply | Qty: 90 | Fill #0

## 2020-07-11 MED FILL — traZODone HCL 50 MG TABS: 50 | 30 days supply | Qty: 60 | Fill #0

## 2020-07-11 MED FILL — BACLOFEN 5 MG TABS: 5 | 15 days supply | Qty: 90 | Fill #0

## 2020-07-11 NOTE — Patient Instructions (Signed)
Pt 55 yr old male with Cauda Equina syndrome- due to  L3 burst fx s/p lami/decompresison here for f/u.   1. Will increase gabapentin to 600 mg 2x/day x 1 week, then 600 mg 3/day- for nerve pain- 5 refills - call if any significant side effects from increase in meds  2. Stop Prazosin since not having any PTSD Sx's anymore.   3. Trazodone 50-100 mg nightly- can start with 50 mg- then go to 75 mg (can break in half) if not enough, can increase to 100 mg nightly- for sleep. 5 refills  4.  Can continue Baclofen 5-10 mg 3x/day as needed for muscle spasms- will send in 90 tabs per month with 5 refills  5. Will see how Pain goes with increase gabapentin and see if needs opiates long term-   6. F/U in 6 weeks

## 2020-07-11 NOTE — Progress Notes (Signed)
Subjective:    Patient ID: Colton Nichols, male    DOB: 1966/02/16, 55 y.o.   MRN: [id number]  HPI Pt with Cauda Equina syndrome- at L3 burst fx s/p lami/decompresison here for f/u.   Used to be smoker- hasn't smoked since 12/19/19. Thinks lungs are clearing out.   Wheezing occ- hasn't gotten worse.  Has flare up with it.   Has appointment to be set up with new PCP.   Finally got disability and medicaid card, etc.   Still taking gabapentin 300 mg TID.   Doesn't think Tramadol really helps pain-  Knows pain "not going ot go away".  Wants to be as comfortable as possible.  Sometimes flares up-  1-2x/every 3 days or so.  Mostly hurts in the mornings. Doesn't sleep well -cant get comfortable.   Gets burning, pins and needles in legs, and feet Occ gets in hands?  Feet also get numb, esp if stands or sits too long.  Associated pins and needles and burning.   Back is real stiff- once gets moving, it calms down.  If grocery store shopping, has to sit down after 30 minutes, for 15-20 minutes, then can get up and move around again,   Prazosin- still taking for sleep Doesn't have any PTSD Sx's anymore.   Uses furniture in home to walk around house, but has to use RW outside the home.  Uses it more when in pain. Even at home.    Is completely out of Tramadol, but wasn't real helpful for burning/nerve pain.    Bowel- has a lot of gas- thinks has to go, and then has gas instead.  Can make it to the toilet when needs to go- no problems controlling it at this time      Bladder- doing fine- no issues with voiding  Social Hx: Still at mother's To get first check this week from Disability.      Pain Inventory Average Pain 6 Pain Right Now 4 My pain is constant, sharp, dull, tingling and aching  In the last 24 hours, has pain interfered with the following? General activity 4 Relation with others 7 Enjoyment of life 5 What TIME of day is your pain at its worst?  morning  Sleep (in general) Fair  Pain is worse with: walking, sitting and standing Pain improves with: rest, heat/ice, therapy/exercise and medication Relief from Meds: fair  Family History  Adopted: Yes   Social History   Socioeconomic History  . Marital status: Single    Spouse name: Not on file  . Number of children: Not on file  . Years of education: Not on file  . Highest education level: Not on file  Occupational History  . Not on file  Tobacco Use  . Smoking status: Former Smoker    Packs/day: 0.50    Types: Cigarettes  . Smokeless tobacco: Never Used  Substance and Sexual Activity  . Alcohol use: Yes    Comment: drinks a couple of beers daily  . Drug use: No  . Sexual activity: Not on file  Other Topics Concern  . Not on file  Social History Narrative  . Not on file   Social Determinants of Health   Financial Resource Strain: Not on file  Food Insecurity: Not on file  Transportation Needs: Not on file  Physical Activity: Not on file  Stress: Not on file  Social Connections: Not on file   Past Surgical History:  Procedure Laterality Date  . LACERATION  REPAIR    . POSTERIOR LUMBAR FUSION 4 LEVEL N/A 12/19/2019   Procedure: Lumbar Three Decompressive Laminectomy with Transpedicular Decompression, Lumbar one - Lumbar five Posterior Lateral Arthrodesis utilizing segmental pedicle screw fixation and local autografting.;  Surgeon: Julio Sicks, MD;  Location: [facility] OR;  Service: Neurosurgery;  Laterality: N/A;   Past Surgical History:  Procedure Laterality Date  . LACERATION REPAIR    . POSTERIOR LUMBAR FUSION 4 LEVEL N/A 12/19/2019   Procedure: Lumbar Three Decompressive Laminectomy with Transpedicular Decompression, Lumbar one - Lumbar five Posterior Lateral Arthrodesis utilizing segmental pedicle screw fixation and local autografting.;  Surgeon: Julio Sicks, MD;  Location: [facility] OR;  Service: Neurosurgery;  Laterality: N/A;   No past medical history on file. There  were no vitals taken for this visit.  Opioid Risk Score:   Fall Risk Score:  `1  Depression screen PHQ 2/9  Depression screen [facility] 2/9 03/14/2020 01/18/2020  Decreased Interest 1 0  Down, Depressed, Hopeless 1 0  PHQ - 2 Score 2 0  Altered sleeping - 0  Tired, decreased energy - 1  Change in appetite - 0  Feeling bad or failure about yourself  - 0  Trouble concentrating - 0  Moving slowly or fidgety/restless - 0  Suicidal thoughts - 0  PHQ-9 Score - 1  Difficult doing work/chores - Not difficult at all   Review of Systems  Musculoskeletal: Positive for arthralgias, back pain, gait problem and neck pain. Negative for neck stiffness.  All other systems reviewed and are negative.      Objective:   Physical Exam  Awake, alert, appropriate, has RW, NAD MS: HF 4+/5, KE 4+/5, KF 4-/5, DF 4+/5, PF 4+/5 B/L  Neuro: Decreased sensation in L3 B/L- but not L4-S1 anymore No clonus B/L No signs of increased tone or spasticity.       Assessment & Plan:   Pt 55 yr old male with Cauda Equina syndrome- due to  L3 burst fx s/p lami/decompresison here for f/u.   1. Will increase gabapentin to 600 mg 2x/day x 1 week, then 600 mg 3/day- for nerve pain- 5 refills - call if any significant side effects from increase in meds  2. Stop Prazosin since not having any PTSD Sx's anymore.   3. Trazodone 50-100 mg nightly- can start with 50 mg- then go to 75 mg (can break in half) if not enough, can increase to 100 mg nightly- for sleep. 5 refills  4.  Can continue Baclofen 5-10 mg 3x/day as needed for muscle spasms- will send in 90 tabs per month with 5 refills  5. Will see how Pain goes with increase gabapentin and see if needs opiates long term-   6. F/U in 6 weeks- make sure to see PCP as soon as gets appointment.    I spent a total of 30 minutes on visit- as detailed above.

## 2020-07-18 ENCOUNTER — Telehealth: Payer: Self-pay | Admitting: *Deleted

## 2020-07-18 NOTE — Telephone Encounter (Signed)
Colton Nichols called about a medication authorization the pharmacy needed.  I spoke with him and it was his Tramadol.  He has not had an Rx written for him by Dr Berline Chough since 03/14/20 but it has one refill and it needed authorization.  The insurance company [employer] approved 07/14/20-01/10/21.

## 2020-08-02 ENCOUNTER — Other Ambulatory Visit: Payer: Self-pay

## 2020-08-02 ENCOUNTER — Emergency Department (HOSPITAL_COMMUNITY): Payer: Medicaid Other

## 2020-08-02 ENCOUNTER — Other Ambulatory Visit: Payer: Self-pay | Admitting: Emergency Medicine

## 2020-08-02 ENCOUNTER — Encounter (HOSPITAL_COMMUNITY): Payer: Self-pay | Admitting: Emergency Medicine

## 2020-08-02 ENCOUNTER — Emergency Department (HOSPITAL_COMMUNITY)
Admission: EM | Admit: 2020-08-02 | Discharge: 2020-08-02 | Disposition: A | Discharge status: Home / Self Care | Payer: Medicaid Other | Attending: Emergency Medicine | Admitting: Emergency Medicine

## 2020-08-02 DIAGNOSIS — Z87891 Personal history of nicotine dependence: Secondary | ICD-10-CM | POA: Diagnosis not present

## 2020-08-02 DIAGNOSIS — R0602 Shortness of breath: Secondary | ICD-10-CM | POA: Diagnosis not present

## 2020-08-02 DIAGNOSIS — R062 Wheezing: Secondary | ICD-10-CM | POA: Diagnosis not present

## 2020-08-02 DIAGNOSIS — J40 Bronchitis, not specified as acute or chronic: Secondary | ICD-10-CM

## 2020-08-02 DIAGNOSIS — R0789 Other chest pain: Secondary | ICD-10-CM | POA: Diagnosis not present

## 2020-08-02 DIAGNOSIS — S33141D Dislocation of L4/L5 lumbar vertebra, subsequent encounter: Secondary | ICD-10-CM | POA: Insufficient documentation

## 2020-08-02 DIAGNOSIS — M4326 Fusion of spine, lumbar region: Secondary | ICD-10-CM | POA: Insufficient documentation

## 2020-08-02 MED ORDER — ALBUTEROL SULFATE HFA 108 (90 BASE) MCG/ACT IN AERS
2.0000 | INHALATION_SPRAY | RESPIRATORY_TRACT | Status: DC
  Administered 2020-08-02: 2 via RESPIRATORY_TRACT
  Filled 2020-08-02: qty 6.7

## 2020-08-02 MED ORDER — PREDNISONE 10 MG (21) PO TBPK: ORAL_TABLET | Freq: Every day | ORAL | 0 refills | Status: DC

## 2020-08-02 MED ORDER — IPRATROPIUM BROMIDE HFA 17 MCG/ACT IN AERS
2.0000 | INHALATION_SPRAY | Freq: Once | RESPIRATORY_TRACT | Status: AC
  Administered 2020-08-02: 2 via RESPIRATORY_TRACT
  Filled 2020-08-02: qty 12.9

## 2020-08-02 MED ORDER — PREDNISONE 20 MG PO TABS
60.0000 mg | ORAL_TABLET | Freq: Once | ORAL | Status: AC
  Administered 2020-08-02: 60 mg via ORAL
  Filled 2020-08-02: qty 3

## 2020-08-02 NOTE — ED Provider Notes (Signed)
[name] EMERGENCY DEPARTMENT Provider Note   CSN: [id number] Arrival date & time: 08/02/20  1033     History Chief Complaint  Patient presents with  . Shortness of Breath    Colton Nichols is a 55 y.o. male.  55 year old male who presents with several weeks of shortness of breath with associated wheezing.  Does have history of significant tobacco use in the past.  Denies being diagnosed with any pulmonary disease.  No fever or chills.  Has had chest tightness but no anginal or CHF qualities.  No treatment use prior to arrival        History reviewed. No pertinent past medical history.  Patient Active Problem List   Diagnosis Date Noted  . Neuropathic pain 07/11/2020  . Impaired gait 01/18/2020  . Acute blood loss anemia 01/12/2020  . Neuropathy 01/12/2020  . Cauda equina syndrome (HCC) 12/22/2019  . Burst fracture of lumbar vertebra (HCC) 12/19/2019  . Surgery follow-up 12/19/2019  . Burst fracture of lumbar vertebra, closed, initial encounter (HCC) 12/19/2019    Past Surgical History:  Procedure Laterality Date  . LACERATION REPAIR    . POSTERIOR LUMBAR FUSION 4 LEVEL N/A 12/19/2019   Procedure: Lumbar Three Decompressive Laminectomy with Transpedicular Decompression, Lumbar one - Lumbar five Posterior Lateral Arthrodesis utilizing segmental pedicle screw fixation and local autografting.;  Surgeon: Julio Sicks, MD;  Location: [facility] OR;  Service: Neurosurgery;  Laterality: N/A;       Family History  Adopted: Yes    Social History   Tobacco Use  . Smoking status: Former Smoker    Packs/day: 0.50    Types: Cigarettes  . Smokeless tobacco: Never Used  Vaping Use  . Vaping Use: Never used  Substance Use Topics  . Alcohol use: Yes    Comment: drinks a couple of beers daily  . Drug use: No    Home Medications Prior to Admission medications   Medication Sig Start Date End Date Taking? Authorizing Provider  Baclofen 5 MG TABS Take 5-10  mg by mouth 3 (three) times daily as needed. For muscle spasms 07/11/20   Lovorn, Aundra Millet, MD  gabapentin (NEURONTIN) 600 MG tablet Take 1 tablet (600 mg total) by mouth 2 (two) times daily. X 1 week, then 600 mg 3x/day- for nerve pain 07/11/20   Lovorn, Aundra Millet, MD  melatonin 3 MG TABS tablet Take 1 tablet (3 mg total) by mouth at bedtime. 01/06/20   Angiulli, Mcarthur Rossetti, PA-C  traZODone (DESYREL) 50 MG tablet Take 1-2 tablets (50-100 mg total) by mouth at bedtime. 07/11/20   Genice Rouge, MD    Allergies    Patient has no known allergies.  Review of Systems   Review of Systems  All other systems reviewed and are negative.   Physical Exam Updated Vital Signs BP (!) 141/92 (BP Location: Right Arm)   Pulse (!) 107   Temp 98.9 F (37.2 C)   Resp 18   SpO2 100%   Physical Exam Vitals and nursing note reviewed.  Constitutional:      General: He is not in acute distress.    Appearance: Normal appearance. He is well-developed. He is not toxic-appearing.  HENT:     Head: Normocephalic and atraumatic.  Eyes:     General: Lids are normal.     Conjunctiva/sclera: Conjunctivae normal.     Pupils: Pupils are equal, round, and reactive to light.  Neck:     Thyroid: No thyroid mass.  Trachea: No tracheal deviation.  Cardiovascular:     Rate and Rhythm: Normal rate and regular rhythm.     Heart sounds: Normal heart sounds. No murmur heard. No gallop.   Pulmonary:     Effort: Pulmonary effort is normal. No respiratory distress.     Breath sounds: No stridor. Decreased breath sounds and wheezing present. No rhonchi or rales.  Abdominal:     General: Bowel sounds are normal. There is no distension.     Palpations: Abdomen is soft.     Tenderness: There is no abdominal tenderness. There is no rebound.  Musculoskeletal:        General: No tenderness. Normal range of motion.     Cervical back: Normal range of motion and neck supple.  Skin:    General: Skin is warm and dry.     Findings: No  abrasion or rash.  Neurological:     Mental Status: He is alert and oriented to person, place, and time.     GCS: GCS eye subscore is 4. GCS verbal subscore is 5. GCS motor subscore is 6.     Cranial Nerves: No cranial nerve deficit.     Sensory: No sensory deficit.  Psychiatric:        Speech: Speech normal.        Behavior: Behavior normal.     ED Results / Procedures / Treatments   Labs (all labs ordered are listed, but only abnormal results are displayed) Labs Reviewed - No data to display  EKG EKG Interpretation  Date/Time:  Tuesday August 02 2020 10:37:00 EDT Ventricular Rate:  104 PR Interval:  166 QRS Duration: 80 QT Interval:  314 QTC Calculation: 412 R Axis:   51 Text Interpretation: Sinus tachycardia Septal infarct , age undetermined Abnormal ECG Confirmed by Lorre Nick ([id number]) on 08/02/2020 10:50:21 AM   Radiology No results found.  Procedures Procedures   Medications Ordered in ED Medications  albuterol (VENTOLIN HFA) 108 (90 Base) MCG/ACT inhaler 2 puff (has no administration in time range)  ipratropium (ATROVENT HFA) inhaler 2 puff (has no administration in time range)  predniSONE (DELTASONE) tablet 60 mg (has no administration in time range)    ED Course  I have reviewed the triage vital signs and the nursing notes.  Pertinent labs & imaging results that were available during my care of the patient were reviewed by me and considered in my medical decision making (see chart for details).    MDM Rules/Calculators/A&P                          Chest x-ray without acute findings.  Patient given a butyryl Atrovent along with prednisone dose will better.  Suspect that patient has bronchitis and will give patient's dose inhaler to go home with as well as placed on prednisone Final Clinical Impression(s) / ED Diagnoses Final diagnoses:  None    Rx / DC Orders ED Discharge Orders    None       Lorre Nick, MD 08/02/20 1228

## 2020-08-02 NOTE — ED Notes (Signed)
Patient verbalizes understanding of discharge instructions. Opportunity for questioning and answers were provided. Armband removed by staff, pt discharged from ED.  

## 2020-08-02 NOTE — Discharge Instructions (Signed)
Use 1 to 2 puffs of the albuterol every 4-6 hours for trouble breathing.  Use 1 to 2 puffs of the Atrovent inhaler every 6 hours as needed for trouble breathing.

## 2020-08-02 NOTE — ED Triage Notes (Signed)
Pt reports wheezing and congestion for a few days.  States this morning he has SOB and productive cough with clear phlegm with black speckles.

## 2020-08-04 DIAGNOSIS — L720 Epidermal cyst: Secondary | ICD-10-CM | POA: Insufficient documentation

## 2020-08-19 ENCOUNTER — Encounter
Payer: Medicaid Other | Attending: Physical Medicine and Rehabilitation | Admitting: Physical Medicine and Rehabilitation

## 2020-08-19 DIAGNOSIS — G629 Polyneuropathy, unspecified: Secondary | ICD-10-CM | POA: Insufficient documentation

## 2020-08-19 DIAGNOSIS — R269 Unspecified abnormalities of gait and mobility: Secondary | ICD-10-CM | POA: Insufficient documentation

## 2020-08-19 DIAGNOSIS — M792 Neuralgia and neuritis, unspecified: Secondary | ICD-10-CM | POA: Insufficient documentation

## 2020-08-19 DIAGNOSIS — G834 Cauda equina syndrome: Secondary | ICD-10-CM | POA: Insufficient documentation

## 2020-09-28 ENCOUNTER — Other Ambulatory Visit: Payer: Self-pay

## 2020-09-28 ENCOUNTER — Encounter
Payer: Medicaid Other | Attending: Physical Medicine and Rehabilitation | Admitting: Physical Medicine and Rehabilitation

## 2020-09-28 ENCOUNTER — Encounter: Payer: Self-pay | Admitting: Physical Medicine and Rehabilitation

## 2020-09-28 VITALS — BP 146/95 | HR 103 | Temp 98.6°F | Ht 73.0 in | Wt 182.4 lb

## 2020-09-28 DIAGNOSIS — G629 Polyneuropathy, unspecified: Secondary | ICD-10-CM | POA: Diagnosis present

## 2020-09-28 DIAGNOSIS — R269 Unspecified abnormalities of gait and mobility: Secondary | ICD-10-CM | POA: Insufficient documentation

## 2020-09-28 DIAGNOSIS — M792 Neuralgia and neuritis, unspecified: Secondary | ICD-10-CM | POA: Diagnosis not present

## 2020-09-28 DIAGNOSIS — S32001A Stable burst fracture of unspecified lumbar vertebra, initial encounter for closed fracture: Secondary | ICD-10-CM | POA: Diagnosis present

## 2020-09-28 DIAGNOSIS — G834 Cauda equina syndrome: Secondary | ICD-10-CM | POA: Insufficient documentation

## 2020-09-28 DIAGNOSIS — G4701 Insomnia due to medical condition: Secondary | ICD-10-CM | POA: Insufficient documentation

## 2020-09-28 MED ORDER — TRAZODONE HCL 50 MG PO TABS: 50.0000 mg | ORAL_TABLET | Freq: Every day | ORAL | 5 refills | Status: DC

## 2020-09-28 MED ORDER — BACLOFEN 5 MG PO TABS: ORAL_TABLET | ORAL | 5 refills | Status: DC

## 2020-09-28 MED ORDER — GABAPENTIN 600 MG PO TABS: 600.0000 mg | ORAL_TABLET | Freq: Three times a day (TID) | ORAL | 5 refills | Status: DC

## 2020-09-28 NOTE — Progress Notes (Signed)
Subjective:    Patient ID: Colton Nichols, male    DOB: Sep 22, 1965, 55 y.o.   MRN: [id number]  HPI  Pt 55 yr old male with Cauda Equina syndrome- due to  L3 burst fx s/p lami/decompresisonhere for f/u on cauda equina syndrome.   Doesn't have the "on fire" pain anymore. With increase in gabapentin. However, if overdoes, certain days, feels like run over by a [employer].  Takes a hot shower- helps some for a little while.  Doesn't take anything OTC to help.  Doesn't like to take meds overall, anyway.   Off Prazosin initially, but put back on it by PCP for BP issues- 2 mg nightly.  And takes Trazodone 50 mg QHS- for sleep.   Takes Baclofen  5 mg 3x/day- sometimes will take 2. It calms things down- takes some of the sting out of it.  Some days  Every once in awhile, will get lightning pain from R lateral waist/hip and then radiates down to R foot. Can feel heart beat in it when it happens.  Occurs 3x/week on average.   It's enough, it really bothers him- has to stop what he's doing- has to "nurse it" for ~ 1 hour to 90 minutes..  Bends "into it" and massage and stretches it- but only helps a little.  Also tries lineament for the pain. That;s soothes it.   Has some tramadol at home- got a refill, then didn't use it, so has a whole bottle of tramadol at home.   Nothing else going on.  Walking still with RW. Uses city bus to get out and about.  Goes to church q Sunday. Staying active.   Can feel to void- and bowel and bladder going well.  Drinks a LOT of water, because always feels dehydrated.    Pain Inventory Average Pain 4 Pain Right Now 4 My pain is constant, dull, tingling, aching and throbbing  In the last 24 hours, has pain interfered with the following? General activity 0 Relation with others 0 Enjoyment of life 0 What TIME of day is your pain at its worst? morning , daytime, evening and night Sleep (in general) Fair  Pain is worse with: walking, bending,  sitting, standing and some activites Pain improves with: rest, heat/ice, medication and hot showers Relief from Meds: 5  Family History  Adopted: Yes   Social History   Socioeconomic History  . Marital status: Single    Spouse name: Not on file  . Number of children: Not on file  . Years of education: Not on file  . Highest education level: Not on file  Occupational History  . Not on file  Tobacco Use  . Smoking status: Former Smoker    Packs/day: 0.50    Types: Cigarettes  . Smokeless tobacco: Never Used  Vaping Use  . Vaping Use: Never used  Substance and Sexual Activity  . Alcohol use: Yes    Comment: drinks a couple of beers daily  . Drug use: No  . Sexual activity: Not on file  Other Topics Concern  . Not on file  Social History Narrative  . Not on file   Social Determinants of Health   Financial Resource Strain: Not on file  Food Insecurity: Not on file  Transportation Needs: Not on file  Physical Activity: Not on file  Stress: Not on file  Social Connections: Not on file   Past Surgical History:  Procedure Laterality Date  . LACERATION REPAIR    .  POSTERIOR LUMBAR FUSION 4 LEVEL N/A 12/19/2019   Procedure: Lumbar Three Decompressive Laminectomy with Transpedicular Decompression, Lumbar one - Lumbar five Posterior Lateral Arthrodesis utilizing segmental pedicle screw fixation and local autografting.;  Surgeon: Julio Sicks, MD;  Location[facility] OR;  Service: Neurosurgery;  Laterality: N/A;   Past Surgical History:  Procedure Laterality Date  . LACERATION REPAIR    . POSTERIOR LUMBAR FUSION 4 LEVEL N/A 12/19/2019   Procedure: Lumbar Three Decompressive Laminectomy with Transpedicular Decompression, Lumbar one - Lumbar five Posterior Lateral Arthrodesis utilizing segmental pedicle screw fixation and local autografting.;  Surgeon: Julio Sicks, MD;  Location: [facility] OR;  Service: Neurosurgery;  Laterality: N/A;   No past medical history on file. There were no vitals  taken for this visit.  Opioid Risk Score:   Fall Risk Score:  `1  Depression screen PHQ 2/9  Depression screen [facility] 2/9 09/28/2020 07/11/2020 03/14/2020 01/18/2020  Decreased Interest 0 0 1 0  Down, Depressed, Hopeless 0 0 1 0  PHQ - 2 Score 0 0 2 0  Altered sleeping - - - 0  Tired, decreased energy - - - 1  Change in appetite - - - 0  Feeling bad or failure about yourself  - - - 0  Trouble concentrating - - - 0  Moving slowly or fidgety/restless - - - 0  Suicidal thoughts - - - 0  PHQ-9 Score - - - 1  Difficult doing work/chores - - - Not difficult at all   Review of Systems  Constitutional: Negative.   HENT: Negative.   Eyes: Negative.   Respiratory: Negative.   Cardiovascular: Negative.   Gastrointestinal: Negative.   Endocrine: Negative.   Genitourinary: Negative.   Musculoskeletal: Positive for arthralgias and back pain.  Skin: Negative.   Allergic/Immunologic: Negative.   Neurological: Negative.   Hematological: Negative.   Psychiatric/Behavioral: Negative.   All other systems reviewed and are negative.      Objective:   Physical Exam  Awake, alert, appropriate, has RW, NAD MS: HF 4-/5 B/L; KE and KF 4/5, DF and PF 5-/5 B/L  Neuro: Absent DTRs B/L in LEs Sensation severely impaired/absent from L1 to S2 B/L      Assessment & Plan:    Pt 55 yr old male with Cauda Equina syndrome- due to  L3 burst fx s/p lami/decompresisonhere for f/u on cauda equina syndrome.  Also has Insomnia- and neuropathic pain.  Injury 12/19/19.  1. Will try Tramadol- has some at home- when has lightning pain take 1 tab as needed for that pain- if doesn't work, call me and I will try Duloxetine. Can refill in future if needs it- if doesn't need it, fine, but if needs refill, will do need Opiate contract and UDS.  At next visit.   2. Will continue Gabapentin 600 mg TID/3x/day- will refill   3. Con't Trazodone 50 mg nightly- for sleep- will refill.   4. Con't Baclofen- is  working well.   5. F/U in 3 months- and if doing well at next appt, can back off to q6 months.   I spent a total of 25 minutes on visit- discussing options for pain control for "lightning pain" and see what we could do for it.

## 2020-09-28 NOTE — Patient Instructions (Signed)
Pt 55 yr old male with Cauda Equina syndrome- due to  L3 burst fx s/p lami/decompresisonhere for f/u on cauda equina syndrome.  Also has Insomnia- and neuropathic pain.  Injury 12/19/19.  1. Will try Tramadol- has some at home- when has lightning pain take 1 tab as needed for that pain- if doesn't work, call me and I will try Duloxetine. Can refill in future if needs it- if doesn't need it, fine, but if needs refill, will do need Opiate contract and UDS.  At next visit.   2. Will continue Gabapentin 600 mg TID/3x/day- will refill   3. Con't Trazodone 50 mg nightly- for sleep- will refill.   4. Con't Baclofen- is working well.   5. F/U in 3 months- and if doing well at next appt, can back off to q6 months.

## 2020-12-30 ENCOUNTER — Encounter: Payer: Self-pay | Admitting: Physical Medicine and Rehabilitation

## 2020-12-30 ENCOUNTER — Encounter
Payer: Medicaid Other | Attending: Physical Medicine and Rehabilitation | Admitting: Physical Medicine and Rehabilitation

## 2020-12-30 ENCOUNTER — Other Ambulatory Visit: Payer: Self-pay

## 2020-12-30 VITALS — BP 155/91 | HR 65 | Temp 98.5°F | Ht 73.0 in | Wt 169.0 lb

## 2020-12-30 DIAGNOSIS — S32001D Stable burst fracture of unspecified lumbar vertebra, subsequent encounter for fracture with routine healing: Secondary | ICD-10-CM | POA: Insufficient documentation

## 2020-12-30 DIAGNOSIS — R269 Unspecified abnormalities of gait and mobility: Secondary | ICD-10-CM | POA: Insufficient documentation

## 2020-12-30 DIAGNOSIS — G834 Cauda equina syndrome: Secondary | ICD-10-CM | POA: Insufficient documentation

## 2020-12-30 DIAGNOSIS — N521 Erectile dysfunction due to diseases classified elsewhere: Secondary | ICD-10-CM | POA: Insufficient documentation

## 2020-12-30 DIAGNOSIS — M792 Neuralgia and neuritis, unspecified: Secondary | ICD-10-CM | POA: Diagnosis present

## 2020-12-30 MED ORDER — GABAPENTIN 600 MG PO TABS: 600.0000 mg | ORAL_TABLET | Freq: Four times a day (QID) | ORAL | 5 refills | Status: DC

## 2020-12-30 NOTE — Progress Notes (Deleted)
Pain Inventory Average Pain 4 Pain Right Now 4 My pain is constant, sharp, and burning  In the last 24 hours, has pain interfered with the following? General activity 6 Relation with others 4 Enjoyment of life 1 What TIME of day is your pain at its worst? varies Sleep (in general) Good  Pain is worse with: walking, standing, and some activites Pain improves with: rest and medication Relief from Meds: 5  Family History  Adopted: Yes   Social History   Socioeconomic History   Marital status: Single    Spouse name: Not on file   Number of children: Not on file   Years of education: Not on file   Highest education level: Not on file  Occupational History   Not on file  Tobacco Use   Smoking status: Former    Packs/day: 0.50    Types: Cigarettes   Smokeless tobacco: Never  Vaping Use   Vaping Use: Never used  Substance and Sexual Activity   Alcohol use: Yes    Comment: drinks a couple of beers daily   Drug use: No   Sexual activity: Not on file  Other Topics Concern   Not on file  Social History Narrative   Not on file   Social Determinants of Health   Financial Resource Strain: Not on file  Food Insecurity: Not on file  Transportation Needs: Not on file  Physical Activity: Not on file  Stress: Not on file  Social Connections: Not on file   Past Surgical History:  Procedure Laterality Date   LACERATION REPAIR     POSTERIOR LUMBAR FUSION 4 LEVEL N/A 12/19/2019   Procedure: Lumbar Three Decompressive Laminectomy with Transpedicular Decompression, Lumbar one - Lumbar five Posterior Lateral Arthrodesis utilizing segmental pedicle screw fixation and local autografting.;  Surgeon: Julio Sicks, MD;  Location: [facility] OR;  Service: Neurosurgery;  Laterality: N/A;   Past Surgical History:  Procedure Laterality Date   LACERATION REPAIR     POSTERIOR LUMBAR FUSION 4 LEVEL N/A 12/19/2019   Procedure: Lumbar Three Decompressive Laminectomy with Transpedicular Decompression,  Lumbar one - Lumbar five Posterior Lateral Arthrodesis utilizing segmental pedicle screw fixation and local autografting.;  Surgeon: Julio Sicks, MD;  Location: [facility] OR;  Service: Neurosurgery;  Laterality: N/A;   History reviewed. No pertinent past medical history. BP (!) 155/91   Pulse 65   Temp 98.5 F (36.9 C)   Ht 6\' 1"  (1.854 m)   Wt 169 lb (76.7 kg)   SpO2 97%   BMI 22.30 kg/m   Opioid Risk Score:   Fall Risk Score:  `1  Depression screen PHQ 2/9  Depression screen [facility] 2/9 09/28/2020 07/11/2020 03/14/2020 01/18/2020  Decreased Interest 0 0 1 0  Down, Depressed, Hopeless 0 0 1 0  PHQ - 2 Score 0 0 2 0  Altered sleeping - - - 0  Tired, decreased energy - - - 1  Change in appetite - - - 0  Feeling bad or failure about yourself  - - - 0  Trouble concentrating - - - 0  Moving slowly or fidgety/restless - - - 0  Suicidal thoughts - - - 0  PHQ-9 Score - - - 1  Difficult doing work/chores - - - Not difficult at all

## 2020-12-30 NOTE — Patient Instructions (Signed)
Pt is a 55 yr old male with Cauda Equina syndrome- due to   L3 burst fx s/p lami/decompresison here for f/u on cauda equina syndrome.  Here for f/u on cauda equina and chronic pain.    Con't Trazodone for sleep; Baclofen 10 mg TID - has multiple refills, so call when needs more refills.   2. Will increase Gabapentin to 600 mg TID plus 1 tab PRN daily for intimacy for pain with erection/arousal. Let me know if doesn't help.   3. If increase in gabapentin doesn't help, will need to send to Urology for pain with erectile dysfunction.   4. F/U in 4 months -

## 2020-12-30 NOTE — Progress Notes (Signed)
Subjective:    Patient ID: Colton Nichols, male    DOB: 30-Sep-1965, 55 y.o.   MRN: [id number]  HPI Pt 55 yr old male with Cauda Equina syndrome- due to   L3 burst fx s/p lami/decompresison here for f/u on cauda equina syndrome.  Here for f/u on cauda equina and chronic pain.   Pain- is tolerable- every once in awhile, overdoes it- walking too far, etc-  Takes bus and gets off and goes walking-  And being on bus- back hurts- by hitting so many bumps.   Lightning pain only occurs when overdoes it- is a warning signal.   Tramadol- only takes when having real hard spasms- Still has 1/2 bottle- so rarely taking.   Just got refills of gabapentin, Trazodone and Baclofen.  Stopped Prazosin- only used as backup for trazodone.   Muscle spasms- R hip every once in awhile- will feel electric down RLE- goes all the way down to toes- Baclofen helps treat this- within 30 minutes of taking Baclofen, will help.   Bowel and bladder going well- fine.   Around the house, walking without the RW but uses outside the house. So has something to hold onto.   Friday is house cleaning day-    No other issues Feels like healing more- more energy at times.  Tries to get out of house- daily, at least to walk driveway- 3-4 days/week goes away from house/traveling.   Problem with sexuality- gets a sharp pain every time gets aroused- Every time gets an erection, gets pain. Nerve pain/sharp shooting pain- it's a buzz kill.   Pain Inventory Average Pain 4 Pain Right Now 4 My pain is constant, sharp, and burning  In the last 24 hours, has pain interfered with the following? General activity 6 Relation with others 4 Enjoyment of life 1 What TIME of day is your pain at its worst? varies Sleep (in general) Good  Pain is worse with: walking, standing, and some activites Pain improves with: rest and medication Relief from Meds: 5  Family History  Adopted: Yes   Social History   Socioeconomic  History   Marital status: Single    Spouse name: Not on file   Number of children: Not on file   Years of education: Not on file   Highest education level: Not on file  Occupational History   Not on file  Tobacco Use   Smoking status: Former    Packs/day: 0.50    Types: Cigarettes   Smokeless tobacco: Never  Vaping Use   Vaping Use: Never used  Substance and Sexual Activity   Alcohol use: Yes    Comment: drinks a couple of beers daily   Drug use: No   Sexual activity: Not on file  Other Topics Concern   Not on file  Social History Narrative   Not on file   Social Determinants of Health   Financial Resource Strain: Not on file  Food Insecurity: Not on file  Transportation Needs: Not on file  Physical Activity: Not on file  Stress: Not on file  Social Connections: Not on file   Past Surgical History:  Procedure Laterality Date   LACERATION REPAIR     POSTERIOR LUMBAR FUSION 4 LEVEL N/A 12/19/2019   Procedure: Lumbar Three Decompressive Laminectomy with Transpedicular Decompression, Lumbar one - Lumbar five Posterior Lateral Arthrodesis utilizing segmental pedicle screw fixation and local autografting.;  Surgeon: Julio Sicks, MD;  Location: [facility] OR;  Service: Neurosurgery;  Laterality: N/A;  Past Surgical History:  Procedure Laterality Date   LACERATION REPAIR     POSTERIOR LUMBAR FUSION 4 LEVEL N/A 12/19/2019   Procedure: Lumbar Three Decompressive Laminectomy with Transpedicular Decompression, Lumbar one - Lumbar five Posterior Lateral Arthrodesis utilizing segmental pedicle screw fixation and local autografting.;  Surgeon: Julio Sicks, MD;  Location: [facility] OR;  Service: Neurosurgery;  Laterality: N/A;   History reviewed. No pertinent past medical history. BP (!) 155/91   Pulse 65   Temp 98.5 F (36.9 C)   Ht 6\' 1"  (1.854 m)   Wt 169 lb (76.7 kg)   SpO2 97%   BMI 22.30 kg/m   Opioid Risk Score:   Fall Risk Score:  `1  Depression screen PHQ 2/9  Depression screen  [facility] 2/9 09/28/2020 07/11/2020 03/14/2020 01/18/2020  Decreased Interest 0 0 1 0  Down, Depressed, Hopeless 0 0 1 0  PHQ - 2 Score 0 0 2 0  Altered sleeping - - - 0  Tired, decreased energy - - - 1  Change in appetite - - - 0  Feeling bad or failure about yourself  - - - 0  Trouble concentrating - - - 0  Moving slowly or fidgety/restless - - - 0  Suicidal thoughts - - - 0  PHQ-9 Score - - - 1  Difficult doing work/chores - - - Not difficult at all    Review of Systems  Constitutional: Negative.   HENT: Negative.    Eyes: Negative.   Respiratory: Negative.    Cardiovascular: Negative.   Gastrointestinal: Negative.   Endocrine: Negative.   Genitourinary: Negative.   Musculoskeletal:  Positive for gait problem.  Skin: Negative.   Allergic/Immunologic: Negative.   Hematological: Negative.   Psychiatric/Behavioral: Negative.    All other systems reviewed and are negative.     Objective:   Physical Exam Awake, alert, appropriate; has RW, NAD  MS:  LE HF 4+/5, KE 5-/5 , and DF 5/5; and PF 5-/5 B/L   Neuro: Severely decreased sensation to light touch from L1- S5 B/L Has patellae 1+ DTRs B/L; trace Achilles B/L  No increased tone; no clonus B/L in LE's       Assessment & Plan:   Pt is a 55 yr old male with Cauda Equina syndrome- due to   L3 burst fx s/p lami/decompresison here for f/u on cauda equina syndrome.  Here for f/u on cauda equina and chronic pain.    Con't Trazodone for sleep; Baclofen 10 mg TID - has multiple refills, so call when needs more refills.   2. Will increase Gabapentin to 600 mg TID plus 1 tab PRN daily for intimacy for pain with erection/arousal. Let me know if doesn't help.   3. If increase in gabapentin doesn't help, will need to send to Urology for pain with erectile dysfunction.   4. F/U in 4 months -   I spent a total of 23 minutes on visit- specifically discussing erectile issues with pain/arousal and how to treat.

## 2021-01-16 DIAGNOSIS — I10 Essential (primary) hypertension: Secondary | ICD-10-CM | POA: Insufficient documentation

## 2021-03-13 DIAGNOSIS — D696 Thrombocytopenia, unspecified: Secondary | ICD-10-CM | POA: Insufficient documentation

## 2021-03-13 DIAGNOSIS — D582 Other hemoglobinopathies: Secondary | ICD-10-CM | POA: Insufficient documentation

## 2021-05-03 ENCOUNTER — Encounter
Payer: Medicaid Other | Attending: Physical Medicine and Rehabilitation | Admitting: Physical Medicine and Rehabilitation

## 2021-05-03 ENCOUNTER — Telehealth: Payer: Self-pay | Admitting: *Deleted

## 2021-05-03 ENCOUNTER — Other Ambulatory Visit: Payer: Self-pay

## 2021-05-03 ENCOUNTER — Encounter: Payer: Self-pay | Admitting: Physical Medicine and Rehabilitation

## 2021-05-03 VITALS — BP 122/86 | HR 94 | Temp 98.7°F | Ht 73.0 in | Wt 165.0 lb

## 2021-05-03 DIAGNOSIS — G834 Cauda equina syndrome: Secondary | ICD-10-CM | POA: Insufficient documentation

## 2021-05-03 DIAGNOSIS — M792 Neuralgia and neuritis, unspecified: Secondary | ICD-10-CM | POA: Insufficient documentation

## 2021-05-03 DIAGNOSIS — N521 Erectile dysfunction due to diseases classified elsewhere: Secondary | ICD-10-CM | POA: Insufficient documentation

## 2021-05-03 MED ORDER — GABAPENTIN 600 MG PO TABS: 600.0000 mg | ORAL_TABLET | Freq: Four times a day (QID) | ORAL | 5 refills | Status: DC

## 2021-05-03 MED ORDER — BACLOFEN 5 MG PO TABS: 5.0000 mg | ORAL_TABLET | Freq: Three times a day (TID) | ORAL | 5 refills | Status: DC

## 2021-05-03 MED ORDER — SILDENAFIL CITRATE 100 MG PO TABS: 100.0000 mg | ORAL_TABLET | Freq: Every day | ORAL | 5 refills | Status: DC | PRN

## 2021-05-03 NOTE — Telephone Encounter (Signed)
Notified of Aspercreme and OTC.

## 2021-05-03 NOTE — Telephone Encounter (Signed)
Colton Nichols is asking about the cream he used in the hospital, he said you talked about it in the visit. He needs a refill.

## 2021-05-03 NOTE — Patient Instructions (Signed)
Pt is a 55 yr old male with HTN, nerve pain Cauda Equina syndrome- due to   L3 burst fx s/p lami/decompresison  (7/21) here for f/u on cauda equina syndrome.  Here for f/u on cauda equina and chronic pain.  Also has erectile dysfunction due ot SCI; no Neurogneic bowel and bladder.   Will try Viagra 100 mg- as needed #30- 5 refills- if have erection >4 hours, decrease dose - cut in half.   - price vastly varies- make sure <$20 before you buy.   -  2. If it doesn't work, will have to get to Urology, because there's no other options I can prescribe.  3. Might have difficulty have ejaculation- - is different than orgasm- and they are different- DON"T use as birth control  that cannot ejaculate the same way- it usually goes up into bladder and next void is milky.   4.  Refill Gabapentin 600 mg 3x/day and can use 4th pill to  help if pain spikes.    5. Con't Baclofen 5-10 mg 3x/day- for spasms- #180- 5 refills  6. Hasn't been using Trazodone- so won't refill right now.   7.. F/U- 4 months-  Tramadol through PCP- takes as needed

## 2021-05-03 NOTE — Progress Notes (Signed)
Subjective:    Patient ID: Colton Nichols, male    DOB: [birth year], 55 y.o.   MRN: [id number]  HPI Pt is a 55 yr old male with HTN, nerve pain Cauda Equina syndrome- due to   L3 burst fx s/p lami/decompresison  (7/21) here for f/u on cauda equina syndrome.  Here for f/u on cauda equina and chronic pain and erectile dysfunction. .     Gabapentin has helped.  Every once in awhile; back will feel like cinder block.  Has pain on R low back- every 4-6 weeks- really sets off and shoots down RLE to knee/R groin as well.   Walks daily- around outside track or mall, etc.    [facility]-   Having difficulty getting a full erection- feels like gets one; but only semi-erect.  Hasn't tried to ejaculate-   Still walking with RW- only uses when outside the house- walks in the house with no AD and doesn't hold onto furniture!  Happy to be where he is right now!  B/B going OK- no issues- voiding well- having BM's 3x/day- eating a lot.  Quit smoking and eating more.    Pain Inventory Average Pain 7 Pain Right Now 6 My pain is intermittent, sharp, dull, and aching  In the last 24 hours, has pain interfered with the following? General activity 3 Relation with others 9 Enjoyment of life 8 What TIME of day is your pain at its worst? morning , daytime, evening, and night Sleep (in general) Fair  Pain is worse with: bending and standing Pain improves with: rest and medication, hot shower Relief from Meds: 7  Family History  Adopted: Yes   Social History   Socioeconomic History   Marital status: Single    Spouse name: Not on file   Number of children: Not on file   Years of education: Not on file   Highest education level: Not on file  Occupational History   Not on file  Tobacco Use   Smoking status: Former    Packs/day: 0.50    Types: Cigarettes   Smokeless tobacco: Never  Vaping Use   Vaping Use: Never used   Substance and Sexual Activity   Alcohol use: Yes    Comment: drinks a couple of beers daily   Drug use: No   Sexual activity: Not on file  Other Topics Concern   Not on file  Social History Narrative   Not on file   Social Determinants of Health   Financial Resource Strain: Not on file  Food Insecurity: Not on file  Transportation Needs: Not on file  Physical Activity: Not on file  Stress: Not on file  Social Connections: Not on file   Past Surgical History:  Procedure Laterality Date   LACERATION REPAIR     POSTERIOR LUMBAR FUSION 4 LEVEL N/A 12/19/2019   Procedure: Lumbar Three Decompressive Laminectomy with Transpedicular Decompression, Lumbar one - Lumbar five Posterior Lateral Arthrodesis utilizing segmental pedicle screw fixation and local autografting.;  Surgeon: Julio Sicks, MD;  Location: [facility] OR;  Service: Neurosurgery;  Laterality: N/A;   Past Surgical History:  Procedure Laterality Date   LACERATION REPAIR     POSTERIOR LUMBAR FUSION 4 LEVEL N/A 12/19/2019   Procedure: Lumbar Three Decompressive Laminectomy with Transpedicular Decompression, Lumbar one - Lumbar five Posterior Lateral Arthrodesis utilizing segmental pedicle screw fixation and local autografting.;  Surgeon: Julio Sicks, MD;  Location: [facility] OR;  Service: Neurosurgery;  Laterality: N/A;   History reviewed. No pertinent past medical history. There were no vitals taken for this visit.  Opioid Risk Score:   Fall Risk Score:  `1  Depression screen PHQ 2/9  Depression screen [facility] 2/9 05/03/2021 09/28/2020 07/11/2020 03/14/2020 01/18/2020  Decreased Interest 0 0 0 1 0  Down, Depressed, Hopeless 0 0 0 1 0  PHQ - 2 Score 0 0 0 2 0  Altered sleeping - - - - 0  Tired, decreased energy - - - - 1  Change in appetite - - - - 0  Feeling bad or failure about yourself  - - - - 0  Trouble concentrating - - - - 0  Moving slowly or fidgety/restless - - - - 0  Suicidal thoughts - - - - 0  PHQ-9 Score - - - - 1   Difficult doing work/chores - - - - Not difficult at all    Review of Systems  Musculoskeletal:  Positive for back pain and gait problem.       Right knee pain  All other systems reviewed and are negative.     Objective:   Physical Exam Awake, alert, appropriate, has RW to get around, NAD  MS: LE's  B/L HF- 5/5; KE/ 5/5; DF 5-/5; PF 5-/5 and EHL 5-/5  Neuro:  Decreased to light touch and pinprick from L3 downwards B/L     Assessment & Plan:   Pt is a 55 yr old male with HTN, nerve pain Cauda Equina syndrome- due to   L3 burst fx s/p lami/decompresison  (7/21) here for f/u on cauda equina syndrome.  Here for f/u on cauda equina and chronic pain.  Also has erectile dysfunction due ot SCI; no Neurogneic bowel and bladder.   Will try Viagra 100 mg- as needed #30- 5 refills- if have erection >4 hours, decrease dose - cut in half.   - price vastly varies- make sure <$20 before you buy.   -  2. If it doesn't work, will have to get to Urology, because there's no other options I can prescribe.  3. Might have difficulty have ejaculation- - is different than orgasm- and they are different- DON"T use as birth control  that cannot ejaculate the same way- it usually goes up into bladder and next void is milky.   4.  Refill Gabapentin 600 mg 3x/day and can use 4th pill to  help if pain spikes.    5. Con't Baclofen 5-10 mg 3x/day- for spasms- #180- 5 refills  6. Hasn't been using Trazodone- so won't refill right now.   7.. F/U- 4 months-  Tramadol through PCP- takes as needed  I spent a total of 31 minutes on visit- discussing erectile issues.

## 2021-07-12 ENCOUNTER — Ambulatory Visit (HOSPITAL_COMMUNITY)
Admission: EM | Admit: 2021-07-12 | Discharge: 2021-07-12 | Disposition: A | Discharge status: Home / Self Care | Payer: Medicaid Other | Attending: Family Medicine | Admitting: Family Medicine

## 2021-07-12 ENCOUNTER — Other Ambulatory Visit: Payer: Self-pay

## 2021-07-12 ENCOUNTER — Encounter (HOSPITAL_COMMUNITY): Payer: Self-pay | Admitting: Emergency Medicine

## 2021-07-12 DIAGNOSIS — L03312 Cellulitis of back [any part except buttock]: Secondary | ICD-10-CM | POA: Diagnosis not present

## 2021-07-12 MED ORDER — MUPIROCIN 2 % EX OINT: 1.0000 "application " | TOPICAL_OINTMENT | Freq: Two times a day (BID) | CUTANEOUS | 0 refills | Status: AC

## 2021-07-12 MED ORDER — SULFAMETHOXAZOLE-TRIMETHOPRIM 800-160 MG PO TABS: 1.0000 | ORAL_TABLET | Freq: Two times a day (BID) | ORAL | 0 refills | Status: AC

## 2021-07-12 NOTE — Discharge Instructions (Addendum)
Take sulfa antibiotic 1 tab twice daily with food for 7 days  Apply mupirocin antibiotic ointment to the affected area twice daily to

## 2021-07-12 NOTE — ED Provider Notes (Signed)
[facility]    CSN: [id number] Arrival date & time: 07/12/21  0920      History   Chief Complaint Chief Complaint  Patient presents with   Abscess    HPI Colton Nichols is a 56 y.o. male.    Abscess Here for a 2-week history of a swelling on his mid back.  No fever or vomiting  It has drained a little bit.  He states that he has had a swelling there off and on through the years   History reviewed. No pertinent past medical history.  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   Neuropathic pain 07/11/2020   Impaired gait 01/18/2020   Acute blood loss anemia 01/12/2020   Neuropathy 01/12/2020   Cauda equina syndrome (HCC) 12/22/2019   Burst fracture of lumbar vertebra (HCC) 12/19/2019   Surgery follow-up 12/19/2019   Burst fracture of lumbar vertebra, closed, initial encounter (HCC) 12/19/2019    Past Surgical History:  Procedure Laterality Date   LACERATION REPAIR     POSTERIOR LUMBAR FUSION 4 LEVEL N/A 12/19/2019   Procedure: Lumbar Three Decompressive Laminectomy with Transpedicular Decompression, Lumbar one - Lumbar five Posterior Lateral Arthrodesis utilizing segmental pedicle screw fixation and local autografting.;  Surgeon: Julio Sicks, MD;  Location: [facility] OR;  Service: Neurosurgery;  Laterality: N/A;       Home Medications    Prior to Admission medications   Medication Sig Start Date End Date Taking? Authorizing Provider  mupirocin ointment (BACTROBAN) 2 % Apply 1 application topically 2 (two) times daily. To affected area till better 07/12/21  Yes Monica Zahler, Janace Aris, MD  sulfamethoxazole-trimethoprim (BACTRIM DS) 800-160 MG tablet Take 1 tablet by mouth 2 (two) times daily for 7 days. 07/12/21 07/19/21 Yes Zenia Resides, MD  albuterol (VENTOLIN HFA) 108 [id number] Base) MCG/ACT inhaler  08/02/20   [provider]   Baclofen 5 MG TABS Take 5-10 mg by mouth 3 (three) times daily. 05/03/21 05/03/22  Lovorn, Aundra Millet, MD  chlorthalidone (HYGROTON) 25 MG tablet  12/09/20   [provider]  chlorthalidone (HYGROTON) 50 MG tablet  12/09/20   [provider]  gabapentin (NEURONTIN) 600 MG tablet Take 1 tablet (600 mg total) by mouth 4 (four) times daily. 05/03/21   Lovorn, Aundra Millet, MD  ipratropium (ATROVENT HFA) 17 MCG/ACT inhaler  08/02/20   [provider]  lisinopril (ZESTRIL) 5 MG tablet  12/09/20   [provider]  predniSONE (DELTASONE) 10 MG tablet  08/02/20   [provider]  sildenafil (VIAGRA) 100 MG tablet Take 1 tablet (100 mg total) by mouth daily as needed for erectile dysfunction. 05/03/21   Lovorn, Aundra Millet, MD  tiZANidine (ZANAFLEX) 4 MG tablet Take 4 mg by mouth 3 (three) times daily as needed. 12/24/20   [provider]  traMADol (ULTRAM) 50 MG tablet Take 50 mg by mouth every 6 (six) hours as needed. 07/20/20   [provider]  traZODone (DESYREL) 50 MG tablet Take 1-2 tablets (50-100 mg total) by mouth at bedtime. 09/28/20   Lovorn, Aundra Millet, MD    Family History Family History  Adopted: Yes    Social History Social History   Tobacco Use   Smoking status: Former    Packs/day: 0.50    Types: Cigarettes   Smokeless tobacco: Never  Vaping Use   Vaping Use: Never used  Substance Use Topics  Alcohol use: Yes    Comment: drinks a couple of beers daily   Drug use: No     Allergies   Patient has no known allergies.   Review of Systems Review of Systems   Physical Exam Triage Vital Signs ED Triage Vitals  Enc Vitals Group     BP 07/12/21 1009 (!) 144/91     Pulse Rate 07/12/21 1009 85     Resp 07/12/21 1009 17     Temp 07/12/21 1009 98.2 F (36.8 C)     Temp Source 07/12/21 1009 Oral     SpO2 07/12/21 1009 97 %     Weight 07/12/21 1008 164 lb 14.5 oz (74.8 kg)     Height 07/12/21 1008 6\' 1"  (1.854 m)     Head Circumference  --      Peak Flow --      Pain Score 07/12/21 1008 0     Pain Loc --      Pain Edu? --      Excl. in GC? --    No data found.  Updated Vital Signs BP (!) 144/91 (BP Location: Right Arm)    Pulse 85    Temp 98.2 F (36.8 C) (Oral)    Resp 17    Ht 6\' 1"  (1.854 m)    Wt 74.8 kg    SpO2 97%    BMI 21.76 kg/m   Visual Acuity Right Eye Distance:   Left Eye Distance:   Bilateral Distance:    Right Eye Near:   Left Eye Near:    Bilateral Near:     Physical Exam Vitals reviewed.  Constitutional:      General: He is not in acute distress.    Appearance: He is not toxic-appearing.  HENT:     Mouth/Throat:     Mouth: Mucous membranes are moist.     Pharynx: No oropharyngeal exudate or posterior oropharyngeal erythema.  Eyes:     Extraocular Movements: Extraocular movements intact.     Conjunctiva/sclera: Conjunctivae normal.     Pupils: Pupils are equal, round, and reactive to light.  Cardiovascular:     Rate and Rhythm: Normal rate and regular rhythm.     Heart sounds: No murmur heard. Pulmonary:     Effort: Pulmonary effort is normal.     Breath sounds: Normal breath sounds.  Musculoskeletal:        General: No swelling or tenderness.     Cervical back: Neck supple.  Lymphadenopathy:     Cervical: No cervical adenopathy.  Skin:    Capillary Refill: Capillary refill takes less than 2 seconds.     Coloration: Skin is not jaundiced or pale.     Comments: There is a 2.5 cm diameter area of induration and erythema on his mid back.  There is an ulceration about 1 cm in diameter in the middle of it.  There is no fluctuance currently  Neurological:     General: No focal deficit present.     Mental Status: He is oriented to person, place, and time.  Psychiatric:        Behavior: Behavior normal.     UC Treatments / Results  Labs (all labs ordered are listed, but only abnormal results are displayed) Labs Reviewed - No data to display  EKG   Radiology No results  found.  Procedures Procedures (including critical care time)  Medications Ordered in UC Medications - No data to display  Initial Impression / Assessment  and Plan / UC Course  I have reviewed the triage vital signs and the nursing notes.  Pertinent labs & imaging results that were available during my care of the patient were reviewed by me and considered in my medical decision making (see chart for details).    We will treat with topical and oral antibiotics.  Discussed with him that he should see his primary office in the next 3 to 4 weeks for them to decide if he should have a sebaceous cyst removal  Final Clinical Impressions(s) / UC Diagnoses   Final diagnoses:  Cellulitis of back except buttock     Discharge Instructions      Take sulfa antibiotic 1 tab twice daily with food for 7 days  Apply mupirocin antibiotic ointment to the affected area twice daily to     ED Prescriptions     Medication Sig Dispense Auth. Provider   sulfamethoxazole-trimethoprim (BACTRIM DS) 800-160 MG tablet Take 1 tablet by mouth 2 (two) times daily for 7 days. 14 tablet Huntington Leverich, Janace Aris, MD   mupirocin ointment (BACTROBAN) 2 % Apply 1 application topically 2 (two) times daily. To affected area till better 22 g Marlinda Mike Janace Aris, MD      PDMP not reviewed this encounter.   Zenia Resides, MD 07/12/21 1058

## 2021-07-12 NOTE — ED Triage Notes (Signed)
Pt reports an abscess on mid back x 2.5 weeks.

## 2021-08-01 DIAGNOSIS — L720 Epidermal cyst: Secondary | ICD-10-CM | POA: Insufficient documentation

## 2021-09-06 ENCOUNTER — Encounter: Payer: Self-pay | Admitting: Physical Medicine and Rehabilitation

## 2021-09-06 ENCOUNTER — Encounter
Payer: Medicaid Other | Attending: Physical Medicine and Rehabilitation | Admitting: Physical Medicine and Rehabilitation

## 2021-09-06 VITALS — BP 152/91 | HR 74 | Ht 73.0 in | Wt 171.2 lb

## 2021-09-06 DIAGNOSIS — G834 Cauda equina syndrome: Secondary | ICD-10-CM | POA: Insufficient documentation

## 2021-09-06 DIAGNOSIS — R269 Unspecified abnormalities of gait and mobility: Secondary | ICD-10-CM | POA: Diagnosis present

## 2021-09-06 DIAGNOSIS — M792 Neuralgia and neuritis, unspecified: Secondary | ICD-10-CM | POA: Diagnosis present

## 2021-09-06 DIAGNOSIS — N521 Erectile dysfunction due to diseases classified elsewhere: Secondary | ICD-10-CM | POA: Insufficient documentation

## 2021-09-06 MED ORDER — BACLOFEN 5 MG PO TABS: 5.0000 mg | ORAL_TABLET | Freq: Three times a day (TID) | ORAL | 11 refills | Status: DC

## 2021-09-06 MED ORDER — GABAPENTIN 600 MG PO TABS: 600.0000 mg | ORAL_TABLET | Freq: Four times a day (QID) | ORAL | 11 refills | Status: DC

## 2021-09-06 NOTE — Progress Notes (Signed)
? ?Subjective:  ? ? Patient ID: Colton Nichols, male    DOB: 1965-12-02, 56 y.o.   MRN: [id number] ? ?HPI ?Pt is a 57 yr old male with HTN, nerve pain, Cauda Equina syndrome- due to   L3 burst fx s/p lami/decompresison  (7/21) here for f/u on cauda equina syndrome.  ?Here for f/u on cauda equina and chronic pain.  ?Also has erectile dysfunction due ot SCI; no Neurogneic bowel and bladder.  ? ? ?Viagra helped- a lot! ?GF was also happy about intimacy.  ? ?Still on first bottle of Viagra.  ? ?Nerve pain- about the same- not bothering him unless locks up ? ?Does a lot of walking- when does this, will lock up esp after cleaning house.  ? ?Only takes an extra pain pill when locks up in back.  ?Gabapentin- usually takes 3x/day- but takes 4th pill 3-4x/week for nerve pain.  ? ?Walks daily- over a day- will usually walk 5 miles/not all at a time. Will walk then sit, then walk, take a break, etc.  ? ?Still taking Tramadol- takes every now and then- ~ 3x/week or so.  ?Trying to cut down as much as possible  ?"Used to pain more now".  ? ?Takes Baclofen 3x/day- takes 5 mg 3/day- hasn't needed to take 10 mg.  ? ? ? ? ? ?Pain Inventory ?Average Pain 7 ?Pain Right Now 6 ?My pain is burning, dull, and tingling ? ?In the last 24 hours, has pain interfered with the following? ?General activity 5 ?Relation with others 5 ?Enjoyment of life 10 ?What TIME of day is your pain at its worst? varies ?Sleep (in general) Fair ? ?Pain is worse with: some activites ?Pain improves with: therapy/exercise and medication ?Relief from Meds: 8 ? ?Family History  ?Adopted: Yes  ? ?Social History  ? ?Socioeconomic History  ? Marital status: Single  ?  Spouse name: Not on file  ? Number of children: Not on file  ? Years of education: Not on file  ? Highest education level: Not on file  ?Occupational History  ? Not on file  ?Tobacco Use  ? Smoking status: Former  ?  Packs/day: 0.50  ?  Types: Cigarettes  ? Smokeless tobacco: Never  ?Vaping Use  ? Vaping  Use: Never used  ?Substance and Sexual Activity  ? Alcohol use: Yes  ?  Comment: drinks a couple of beers daily  ? Drug use: No  ? Sexual activity: Not on file  ?Other Topics Concern  ? Not on file  ?Social History Narrative  ? Not on file  ? ?Social Determinants of Health  ? ?Financial Resource Strain: Not on file  ?Food Insecurity: Not on file  ?Transportation Needs: Not on file  ?Physical Activity: Not on file  ?Stress: Not on file  ?Social Connections: Not on file  ? ?Past Surgical History:  ?Procedure Laterality Date  ? LACERATION REPAIR    ? POSTERIOR LUMBAR FUSION 4 LEVEL N/A 12/19/2019  ? Procedure: Lumbar Three Decompressive Laminectomy with Transpedicular Decompression, Lumbar one - Lumbar five Posterior Lateral Arthrodesis utilizing segmental pedicle screw fixation and local autografting.;  Surgeon: Julio Sicks, MD;  Location: [facility] OR;  Service: Neurosurgery;  Laterality: N/A;  ? ?Past Surgical History:  ?Procedure Laterality Date  ? LACERATION REPAIR    ? POSTERIOR LUMBAR FUSION 4 LEVEL N/A 12/19/2019  ? Procedure: Lumbar Three Decompressive Laminectomy with Transpedicular Decompression, Lumbar one - Lumbar five Posterior Lateral Arthrodesis utilizing segmental pedicle screw fixation and  local autografting.;  Surgeon: Julio Sicks, MD;  Location: [facility] OR;  Service: Neurosurgery;  Laterality: N/A;  ? ?History reviewed. No pertinent past medical history. ?BP (!) 152/91   Pulse 74   Ht 6\' 1"  (1.854 m)   Wt 171 lb 3.2 oz (77.7 kg)   SpO2 97%   BMI 22.59 kg/m?  ? ?Opioid Risk Score:   ?Fall Risk Score:  `1 ? ?Depression screen PHQ 2/9 ? ? ?  09/06/2021  ?  9:09 AM 05/03/2021  ?  8:57 AM 09/28/2020  ?  8:11 AM 07/11/2020  ? 12:44 PM 03/14/2020  ?  9:39 AM 01/18/2020  ?  9:43 AM  ?Depression screen PHQ 2/9  ?Decreased Interest 0 0 0 0 1 0  ?Down, Depressed, Hopeless 0 0 0 0 1 0  ?PHQ - 2 Score 0 0 0 0 2 0  ?Altered sleeping      0  ?Tired, decreased energy      1  ?Change in appetite      0  ?Feeling bad or failure  about yourself       0  ?Trouble concentrating      0  ?Moving slowly or fidgety/restless      0  ?Suicidal thoughts      0  ?PHQ-9 Score      1  ?Difficult doing work/chores      Not difficult at all  ?  ? ?Review of Systems  ?Constitutional: Negative.   ?HENT: Negative.    ?Eyes: Negative.   ?Respiratory: Negative.    ?Cardiovascular: Negative.   ?Gastrointestinal: Negative.   ?Endocrine: Negative.   ?Genitourinary: Negative.   ?Musculoskeletal:  Positive for back pain.  ?Skin: Negative.   ?Allergic/Immunologic: Negative.   ?Neurological: Negative.   ?Hematological: Negative.   ?Psychiatric/Behavioral: Negative.    ? ?   ?Objective:  ? Physical Exam ? ?Awake, alert, appropriate, sitting on table; hair grown out some, NAD ?MS: HF 4+/5 B/L; KE 5-/5; DF/PF 5/5 B/L  ? ?Neuro: ?Decreased to light touch in L1-L4 ,but slightly better in L5/S1 B/L - still not normal compared to upper body.  ?No increased tone in LE's- no clonus B/L  ? ?Gait- walking with RW ?   ?Assessment & Plan:  ? ?Pt is a 56 yr old male with HTN, nerve pain Cauda Equina syndrome- due to   L3 burst fx s/p lami/decompresison  (7/21) here for f/u on cauda equina syndrome.  ?Here for f/u on cauda equina and chronic pain.  ?Also has erectile dysfunction due ot SCI; no Neurogneic bowel and bladder.  ? ?Con't Viagra for erectile dysfunction. Doesn't need refills today ? ?2. Con't Baclofen for muscle spasms, but don't see spasticity on exam- doesn't need refills today but will in 2 months, so will refill 11 refills  ? ?3. Will refill Gabapentin 600 mg up to 4x/day for nerve pain- 11 refills.  ? ?4. F/U in 6 months.  ? ?I spent a total of  21  minutes on total care today- >50% coordination of care- due to discussion about mood- and nerve pain ? ?

## 2021-09-06 NOTE — Patient Instructions (Signed)
Pt is a 56 yr old male with HTN, nerve pain Cauda Equina syndrome- due to   L3 burst fx s/p lami/decompresison  (7/21) here for f/u on cauda equina syndrome.  ?Here for f/u on cauda equina and chronic pain.  ?Also has erectile dysfunction due ot SCI; no Neurogneic bowel and bladder.  ? ?Con't Viagra for erectile dysfunction. Doesn't need refills today ? ?2. Con't Baclofen for muscle spasms, but don't see spasticity on exam- doesn't need refills today but will in 2 months, so will refill 11 refills  ? ?3. Will refill Gabapentin 600 mg up to 4x/day for nerve pain- 11 refills.  ? ?4. F/U in 6 months.  ?

## 2021-10-22 IMAGING — DX DG CHEST 2V
2 series · 2 of 2 positions shown · non-contrast
Comparison: 10/27/2018

CLINICAL DATA: Shortness of breath, wheezing, congestion

EXAM:
CHEST - 2 VIEW

[w chest pa]
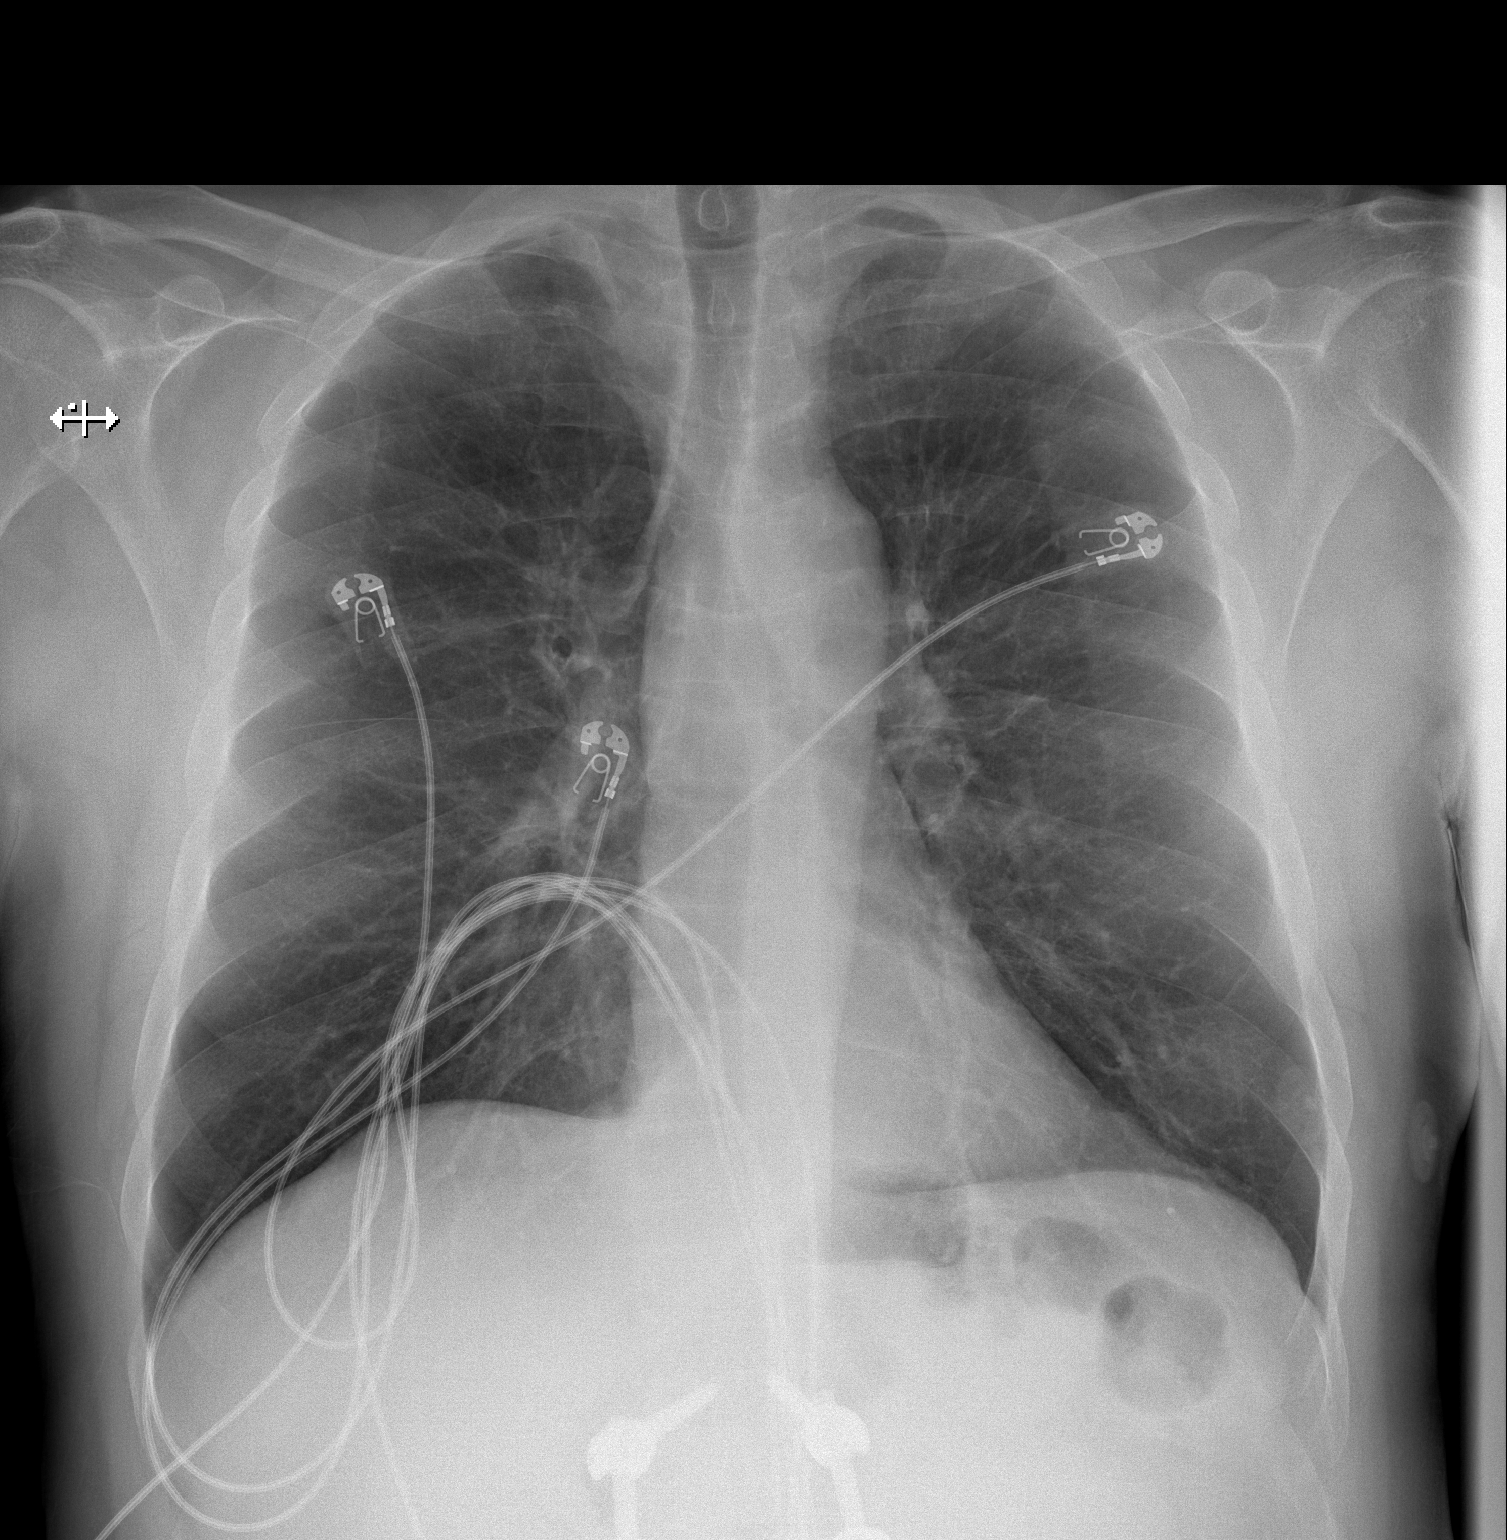

[w chest lat]
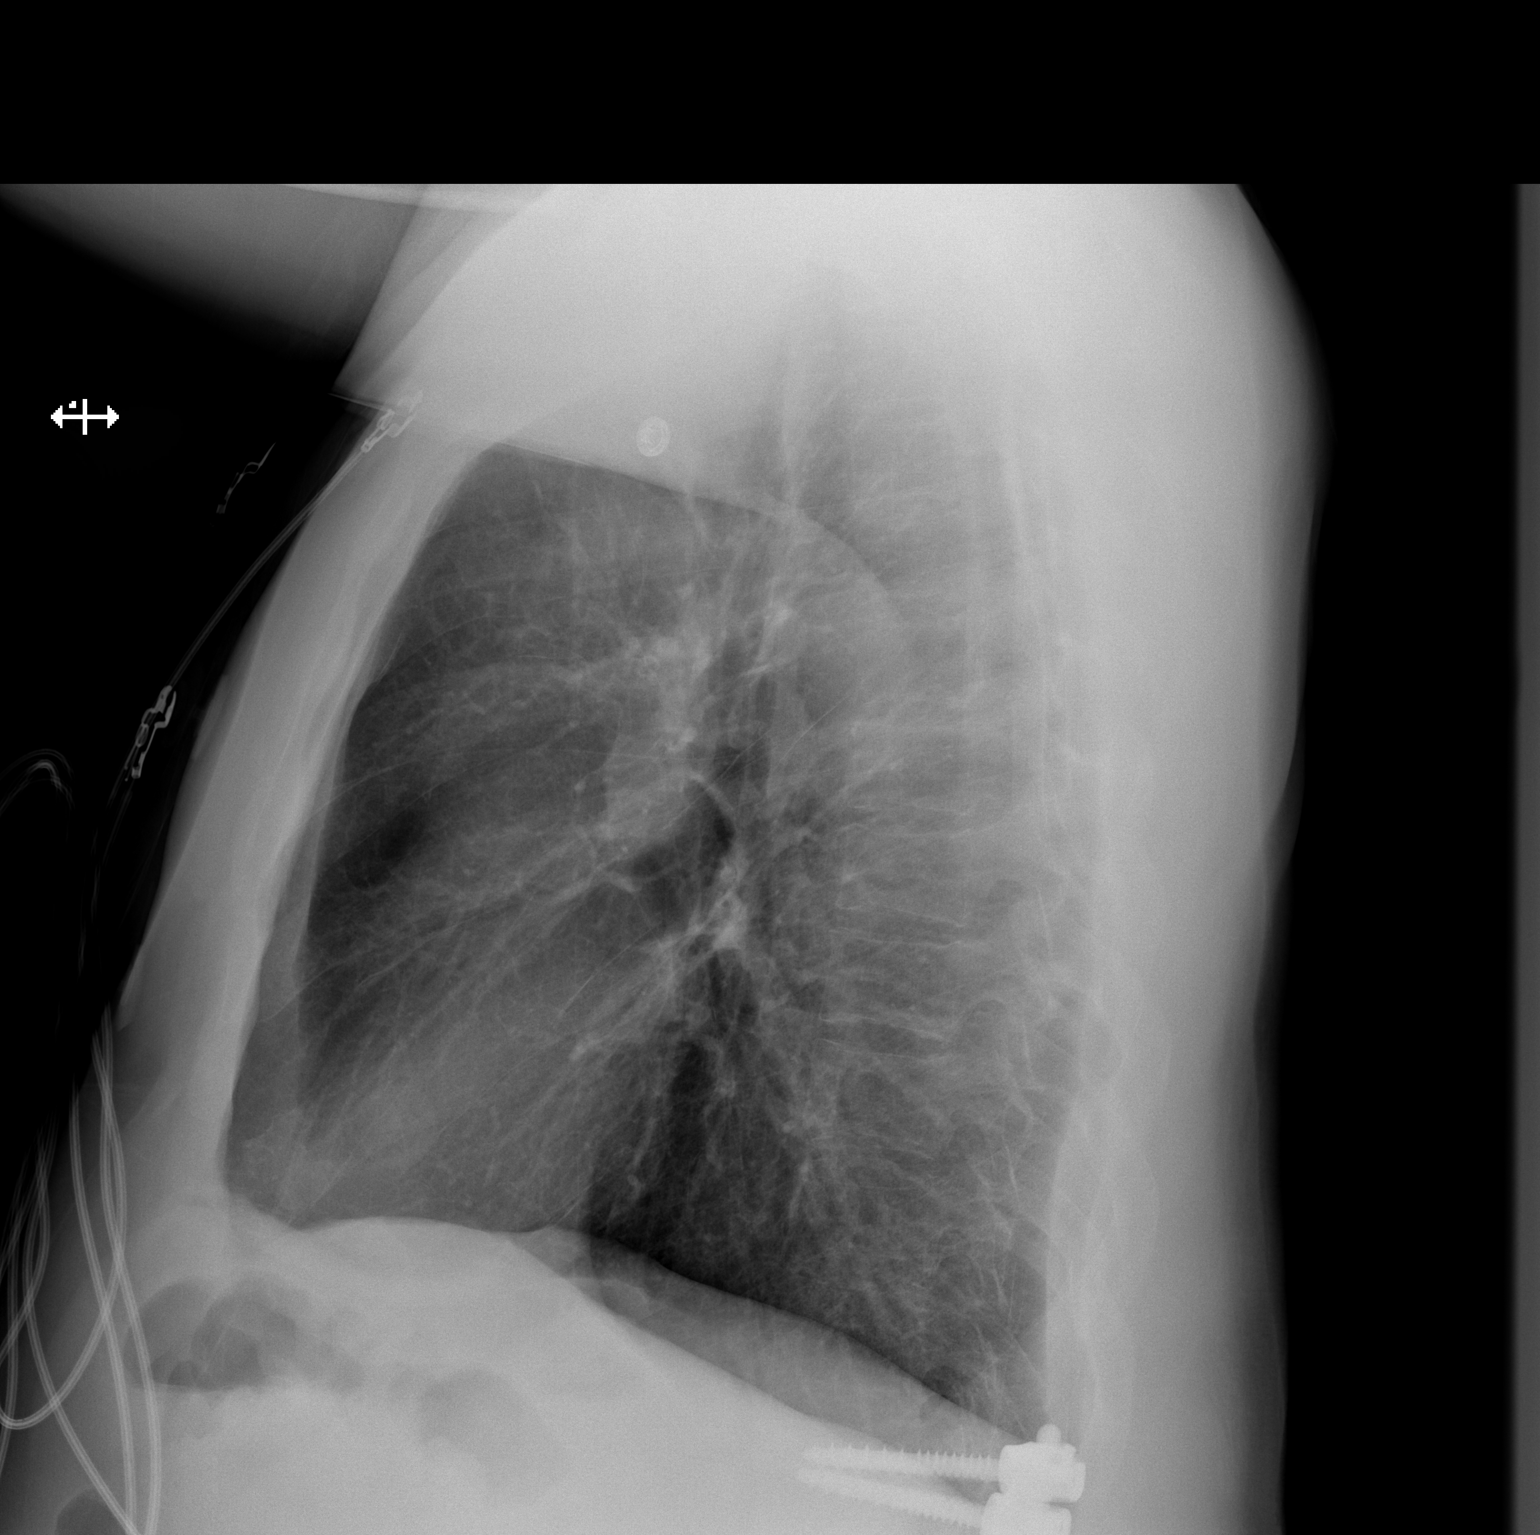

[2 of 2 positions shown; findings below may reference images not displayed]

FINDINGS: Heart and mediastinal contours are within normal limits. No focal
opacities or effusions. No acute bony abnormality.
IMPRESSION: No active cardiopulmonary disease.

## 2021-12-29 DIAGNOSIS — D599 Acquired hemolytic anemia, unspecified: Secondary | ICD-10-CM | POA: Insufficient documentation

## 2021-12-29 DIAGNOSIS — S33101A Dislocation of unspecified lumbar vertebra, initial encounter: Secondary | ICD-10-CM | POA: Insufficient documentation

## 2022-01-18 ENCOUNTER — Other Ambulatory Visit: Payer: Self-pay | Admitting: Neurosurgery

## 2022-01-18 DIAGNOSIS — S32032A Unstable burst fracture of third lumbar vertebra, initial encounter for closed fracture: Secondary | ICD-10-CM

## 2022-01-31 ENCOUNTER — Ambulatory Visit
Admission: RE | Admit: 2022-01-31 | Discharge: 2022-01-31 | Disposition: A | Discharge status: Home / Self Care | Payer: Medicaid Other | Source: Ambulatory Visit | Attending: Neurosurgery | Admitting: Neurosurgery

## 2022-01-31 DIAGNOSIS — S32032A Unstable burst fracture of third lumbar vertebra, initial encounter for closed fracture: Secondary | ICD-10-CM

## 2022-02-09 ENCOUNTER — Other Ambulatory Visit: Payer: Self-pay | Admitting: Neurosurgery

## 2022-02-19 ENCOUNTER — Encounter (HOSPITAL_COMMUNITY): Payer: Self-pay

## 2022-02-19 NOTE — Pre-Procedure Instructions (Signed)
Surgical Instructions    Your procedure is scheduled on Monday, October 9th.  Report to [facility] Main Entrance "A" at 06:00 A.M., then check in with the Admitting office.  Call this number if you have problems the morning of surgery:  [phone]   If you have any questions prior to your surgery date call [phone]: Open Monday-Friday 8am-4pm    Remember:  Do not eat or drink after midnight the night before your surgery     Take these medicines the morning of surgery with A SIP OF WATER  Baclofen  gabapentin (NEURONTIN)   If needed: albuterol (VENTOLIN HFA)- if needed, bring with you on day of surgery tiZANidine (ZANAFLEX)   As of today, STOP taking any Aspirin (unless otherwise instructed by your surgeon) Aleve, Naproxen, Ibuprofen, Motrin, Advil, Goody's, BC's, all herbal medications, fish oil, and all vitamins.                     Do NOT Smoke (Tobacco/Vaping) for 24 hours prior to your procedure.  If you use a CPAP at night, you may bring your mask/headgear for your overnight stay.   Contacts, glasses, piercing's, hearing aid's, dentures or partials may not be worn into surgery, please bring cases for these belongings.    For patients admitted to the hospital, discharge time will be determined by your treatment team.   Patients discharged the day of surgery will not be allowed to drive home, and someone needs to stay with them for 24 hours.  SURGICAL WAITING ROOM VISITATION Patients having surgery or a procedure may have no more than 2 support people in the waiting area - these visitors may rotate.   Children under the age of 20 must have an adult with them who is not the patient. If the patient needs to stay at the hospital during part of their recovery, the visitor guidelines for inpatient rooms apply. Pre-op nurse will coordinate an appropriate time for 1 support person to accompany patient in pre-op.  This support person may not rotate.   Please refer to the  [facility] website for the visitor guidelines for Inpatients (after your surgery is over and you are in a regular room).    Special instructions:   [place]- Preparing For Surgery  Before surgery, you can play an important role. Because skin is not sterile, your skin needs to be as free of germs as possible. You can reduce the number of germs on your skin by washing with CHG (chlorahexidine gluconate) Soap before surgery.  CHG is an antiseptic cleaner which kills germs and bonds with the skin to continue killing germs even after washing.    Oral Hygiene is also important to reduce your risk of infection.  Remember - BRUSH YOUR TEETH THE MORNING OF SURGERY WITH YOUR REGULAR TOOTHPASTE  Please do not use if you have an allergy to CHG or antibacterial soaps. If your skin becomes reddened/irritated stop using the CHG.  Do not shave (including legs and underarms) for at least 48 hours prior to first CHG shower. It is OK to shave your face.  Please follow these instructions carefully.   Shower the NIGHT BEFORE SURGERY and the MORNING OF SURGERY  If you chose to wash your hair, wash your hair first as usual with your normal shampoo.  After you shampoo, rinse your hair and body thoroughly to remove the shampoo.  Use CHG Soap as you would any other liquid soap. You can apply CHG directly to  the skin and wash gently with a scrungie or a clean washcloth.   Apply the CHG Soap to your body ONLY FROM THE NECK DOWN.  Do not use on open wounds or open sores. Avoid contact with your eyes, ears, mouth and genitals (private parts). Wash Face and genitals (private parts)  with your normal soap.   Wash thoroughly, paying special attention to the area where your surgery will be performed.  Thoroughly rinse your body with warm water from the neck down.  DO NOT shower/wash with your normal soap after using and rinsing off the CHG Soap.  Pat yourself dry with a CLEAN TOWEL.  Wear CLEAN PAJAMAS to bed the  night before surgery  Place CLEAN SHEETS on your bed the night before your surgery  DO NOT SLEEP WITH PETS.   Day of Surgery: Take a shower with CHG soap. Do not wear jewelry  Do not wear lotions, powders, colognes, or deodorant. Men may shave face and neck. Do not bring valuables to the hospital. [facility] is not responsible for any belongings or valuables.  Wear Clean/Comfortable clothing the morning of surgery Remember to brush your teeth WITH YOUR REGULAR TOOTHPASTE.   Please read over the following fact sheets that you were given.    If you received a COVID test during your pre-op visit  it is requested that you wear a mask when out in public, stay away from anyone that may not be feeling well and notify your surgeon if you develop symptoms. If you have been in contact with anyone that has tested positive in the last 10 days please notify you surgeon.

## 2022-02-21 ENCOUNTER — Inpatient Hospital Stay (HOSPITAL_COMMUNITY)
Admission: RE | Admit: 2022-02-21 | Discharge: 2022-02-21 | Disposition: A | Discharge status: Home / Self Care | Payer: Medicaid Other | Source: Ambulatory Visit

## 2022-02-21 HISTORY — DX: Essential (primary) hypertension: I10

## 2022-02-21 HISTORY — DX: Unspecified asthma, uncomplicated: J45.909

## 2022-02-21 NOTE — Progress Notes (Addendum)
Patient had PAT appointment today at 8 AM, but he didn't come. This Probation officer called the patient and patient said that his surgery was cancelled due to insurance issues. This Probation officer called Dr. Marchelle Folks office and spoke with Lorriane Shire, the surgical scheduler. Lorriane Shire wasn't aware of the cancellation and she said she will call the patient and talk with him.  Also, I sent a message to Dr. Annette Stable via [facility] and ask if the surgery is canceled or if we need to reschedule PAT appointment.

## 2022-02-21 NOTE — Pre-Procedure Instructions (Signed)
Surgical Instructions    Your procedure is scheduled on Tuesday, October 10th, 2023   Report to [facility] Main Entrance "A" at 06:00 A.M., then check in with the Admitting office.  Call this number if you have problems the morning of surgery:  [phone]   If you have any questions prior to your surgery date call [phone]: Open Monday-Friday 8am-4pm    Remember:  Do not eat or drink after midnight the night before your surgery     Take these medicines the morning of surgery with A SIP OF WATER   Baclofen  gabapentin (NEURONTIN)   If needed: albuterol (VENTOLIN HFA)- if needed, bring with you on day of surgery tiZANidine (ZANAFLEX)   As of today, STOP taking any Aspirin (unless otherwise instructed by your surgeon) Aleve, Naproxen, Ibuprofen, Motrin, Advil, Goody's, BC's, all herbal medications, fish oil, and all vitamins.                     Do NOT Smoke (Tobacco/Vaping) for 24 hours prior to your procedure.  If you use a CPAP at night, you may bring your mask/headgear for your overnight stay.   Contacts, glasses, piercing's, hearing aid's, dentures or partials may not be worn into surgery, please bring cases for these belongings.    For patients admitted to the hospital, discharge time will be determined by your treatment team.   Patients discharged the day of surgery will not be allowed to drive home, and someone needs to stay with them for 24 hours.  SURGICAL WAITING ROOM VISITATION Patients having surgery or a procedure may have no more than 2 support people in the waiting area - these visitors may rotate.   Children under the age of 67 must have an adult with them who is not the patient. If the patient needs to stay at the hospital during part of their recovery, the visitor guidelines for inpatient rooms apply. Pre-op nurse will coordinate an appropriate time for 1 support person to accompany patient in pre-op.  This support person may not rotate.   Please  refer to the [facility] website for the visitor guidelines for Inpatients (after your surgery is over and you are in a regular room).    Special instructions:   [place]- Preparing For Surgery  Before surgery, you can play an important role. Because skin is not sterile, your skin needs to be as free of germs as possible. You can reduce the number of germs on your skin by washing with CHG (chlorahexidine gluconate) Soap before surgery.  CHG is an antiseptic cleaner which kills germs and bonds with the skin to continue killing germs even after washing.    Oral Hygiene is also important to reduce your risk of infection.  Remember - BRUSH YOUR TEETH THE MORNING OF SURGERY WITH YOUR REGULAR TOOTHPASTE  Please do not use if you have an allergy to CHG or antibacterial soaps. If your skin becomes reddened/irritated stop using the CHG.  Do not shave (including legs and underarms) for at least 48 hours prior to first CHG shower. It is OK to shave your face.  Please follow these instructions carefully.   Shower the NIGHT BEFORE SURGERY and the MORNING OF SURGERY  If you chose to wash your hair, wash your hair first as usual with your normal shampoo.  After you shampoo, rinse your hair and body thoroughly to remove the shampoo.  Use CHG Soap as you would any other liquid soap. You can apply  CHG directly to the skin and wash gently with a scrungie or a clean washcloth.   Apply the CHG Soap to your body ONLY FROM THE NECK DOWN.  Do not use on open wounds or open sores. Avoid contact with your eyes, ears, mouth and genitals (private parts). Wash Face and genitals (private parts)  with your normal soap.   Wash thoroughly, paying special attention to the area where your surgery will be performed.  Thoroughly rinse your body with warm water from the neck down.  DO NOT shower/wash with your normal soap after using and rinsing off the CHG Soap.  Pat yourself dry with a CLEAN TOWEL.  Wear CLEAN  PAJAMAS to bed the night before surgery  Place CLEAN SHEETS on your bed the night before your surgery  DO NOT SLEEP WITH PETS.   Day of Surgery: Take a shower with CHG soap. Do not wear jewelry  Do not wear lotions, powders, colognes, or deodorant. Men may shave face and neck. Do not bring valuables to the hospital. [facility] is not responsible for any belongings or valuables.  Wear Clean/Comfortable clothing the morning of surgery Remember to brush your teeth WITH YOUR REGULAR TOOTHPASTE.   Please read over the following fact sheets that you were given.    If you received a COVID test during your pre-op visit  it is requested that you wear a mask when out in public, stay away from anyone that may not be feeling well and notify your surgeon if you develop symptoms. If you have been in contact with anyone that has tested positive in the last 10 days please notify you surgeon.

## 2022-02-27 ENCOUNTER — Ambulatory Visit (HOSPITAL_COMMUNITY): Admission: RE | Admit: 2022-02-27 | Payer: Medicaid Other | Source: Home / Self Care | Admitting: Neurosurgery

## 2022-02-27 SURGERY — REMOVAL, HARDWARE
Anesthesia: General | Site: Back | Laterality: Bilateral

## 2022-03-06 ENCOUNTER — Telehealth: Payer: Self-pay

## 2022-03-06 NOTE — Telephone Encounter (Signed)
Baclofen 5 MG oral tablet approved. Approval faxed to [facility].

## 2022-03-07 ENCOUNTER — Encounter
Payer: Medicaid Other | Attending: Physical Medicine and Rehabilitation | Admitting: Physical Medicine and Rehabilitation

## 2022-03-28 ENCOUNTER — Other Ambulatory Visit: Payer: Self-pay

## 2022-03-29 ENCOUNTER — Other Ambulatory Visit: Payer: Self-pay | Admitting: Physical Medicine and Rehabilitation

## 2022-03-29 MED ORDER — TIZANIDINE HCL 4 MG PO CAPS: 4.0000 mg | ORAL_CAPSULE | Freq: Three times a day (TID) | ORAL | 1 refills | Status: DC | PRN

## 2022-03-29 MED ORDER — GABAPENTIN 600 MG PO TABS: 600.0000 mg | ORAL_TABLET | Freq: Four times a day (QID) | ORAL | 3 refills | Status: DC

## 2022-04-04 ENCOUNTER — Other Ambulatory Visit: Payer: Self-pay | Admitting: Physical Medicine and Rehabilitation

## 2022-06-09 DIAGNOSIS — Z0100 Encounter for examination of eyes and vision without abnormal findings: Secondary | ICD-10-CM | POA: Diagnosis not present

## 2022-07-12 DIAGNOSIS — T8484XA Pain due to internal orthopedic prosthetic devices, implants and grafts, initial encounter: Secondary | ICD-10-CM | POA: Diagnosis not present

## 2022-07-16 ENCOUNTER — Other Ambulatory Visit: Payer: Self-pay | Admitting: Neurosurgery

## 2022-07-20 ENCOUNTER — Other Ambulatory Visit: Payer: Self-pay

## 2022-07-20 ENCOUNTER — Encounter (HOSPITAL_COMMUNITY): Payer: Self-pay | Admitting: Neurosurgery

## 2022-07-20 NOTE — Pre-Procedure Instructions (Signed)
PCP - new PCP Dr.Janice Laurance Flatten Cardiologist - pt denies  PPM/ICD - pt denies  EKG - 08/03/20 Stress Test - pt denies ECHO - pt denies Cardiac Cath - pt denies  Sleep Study/CPAP - pt denies  Diabetic- pt denies  Last dose of GLP1 agonist-  pt denies GLP1 instructions: n/a  Blood Thinner Instructions:pt denies  Aspirin Instructions:n/a  NPO after Midnight   COVID TEST- n/a   Anesthesia review: NO   Patient verbally denies any shortness of breath, fever, cough and chest pain during phone call.     -------------  SDW INSTRUCTIONS given:   Your procedure is scheduled on 07/24/22.             Report to [facility] Main Entrance "A" at  8:15  A.M., and check in at the Admitting office.             Call this number if you have problems the morning of surgery:             [phone]               Remember:             Do not eat or drink after midnight the night before your surgery                          Take these medicines the morning of surgery with A SIP OF WATER  gabapentin (NEURONTIN) ,Baclofen , tiZANidine (ZANAFLEX)-PRN, albuterol (VENTOLIN HFA) 108 (90 Base) MCG/ACT inhaler -pt will bring with him day of surgery   As of today, STOP taking any Aspirin (unless otherwise instructed by your surgeon) Aleve, Naproxen, Ibuprofen, Motrin, Advil, Goody's, BC's, all herbal medications, fish oil, and all vitamins.                        Do not wear jewelry, make up, or nail polish            Do not wear lotions, powders, perfumes/colognes, or deodorant.            Do not shave 48 hours prior to surgery.  Men may shave face and neck.            Do not bring valuables to the hospital.            [facility] is not responsible for any belongings or valuables.   Do NOT Smoke (Tobacco/Vaping) 24 hours prior to your procedure If you use a CPAP at night, you may bring all equipment for your overnight stay.   Contacts, glasses, dentures or partials may not be worn into surgery.       For patients admitted to the hospital, discharge time will be determined by your treatment team.   Patients discharged the day of surgery will not be allowed to drive home, and someone needs to stay with them for 24 hours.     Special instructions:   [place]- Preparing For Surgery  Oral Hygiene is also important to reduce your risk of infection.  Remember - BRUSH YOUR TEETH THE MORNING OF SURGERY WITH YOUR REGULAR TOOTHPASTE   Before surgery, you can play an important role. Because skin is not sterile, your skin needs to be as free of germs as possible. You can reduce the number of germs on your skin by washing with Dial (or any antibacterial) Soap before surgery.    Please do  not use if you have an allergy to antibacterial soaps. If your skin becomes reddened/irritated stop using the Antibacterial Soap  Do not shave (including legs and underarms) for at least 48 hours prior to surgery. It is OK to shave your face.   Please follow these instructions carefully.              Shower the NIGHT BEFORE SURGERY and the MORNING OF SURGERY with (DIAL) Antibacterial Soap. Wash your body and hair with your normal shampoo/soap then rinse. Using a clean wash cloth wash from your neck down using the antibacterial soap. Wash thoroughly, paying special attention to the area where your surgery will be performed. Thoroughly rinse your body with warm water from the neck down.   Pat yourself dry with a CLEAN TOWEL.   Wear CLEAN PAJAMAS to bed the night before surgery.   Place CLEAN SHEETS on your bed the night of your surgery and DO NOT SLEEP WITH PETS.     Day of Surgery: Please shower morning of surgery using antibacterial soap Wear Clean/Comfortable clothing the morning of surgery Do not apply any deodorants/lotions.   Remember to brush your teeth WITH YOUR REGULAR TOOTHPASTE.   Questions were answered. Patient verbalized understanding of instructions.

## 2022-07-24 ENCOUNTER — Other Ambulatory Visit: Payer: Self-pay

## 2022-07-24 ENCOUNTER — Inpatient Hospital Stay (HOSPITAL_COMMUNITY)
Admission: RE | Admit: 2022-07-24 | Discharge: 2022-07-25 | DRG: 517 | Disposition: A | Discharge status: Home / Self Care | Payer: Medicare HMO | Attending: Neurosurgery | Admitting: Neurosurgery

## 2022-07-24 ENCOUNTER — Encounter (HOSPITAL_COMMUNITY)
Admission: RE | Disposition: A | Discharge status: Home / Self Care | Payer: Self-pay | Source: Home / Self Care | Attending: Neurosurgery

## 2022-07-24 ENCOUNTER — Inpatient Hospital Stay (HOSPITAL_COMMUNITY): Payer: Medicare HMO | Admitting: Certified Registered Nurse Anesthetist

## 2022-07-24 ENCOUNTER — Inpatient Hospital Stay (HOSPITAL_COMMUNITY): Payer: Medicare HMO

## 2022-07-24 ENCOUNTER — Encounter (HOSPITAL_COMMUNITY): Payer: Self-pay | Admitting: Neurosurgery

## 2022-07-24 DIAGNOSIS — Y831 Surgical operation with implant of artificial internal device as the cause of abnormal reaction of the patient, or of later complication, without mention of misadventure at the time of the procedure: Secondary | ICD-10-CM | POA: Diagnosis present

## 2022-07-24 DIAGNOSIS — F17210 Nicotine dependence, cigarettes, uncomplicated: Secondary | ICD-10-CM | POA: Diagnosis present

## 2022-07-24 DIAGNOSIS — T84296A Other mechanical complication of internal fixation device of vertebrae, initial encounter: Secondary | ICD-10-CM | POA: Diagnosis not present

## 2022-07-24 DIAGNOSIS — Z981 Arthrodesis status: Secondary | ICD-10-CM

## 2022-07-24 DIAGNOSIS — J45909 Unspecified asthma, uncomplicated: Secondary | ICD-10-CM | POA: Diagnosis present

## 2022-07-24 DIAGNOSIS — T8484XA Pain due to internal orthopedic prosthetic devices, implants and grafts, initial encounter: Principal | ICD-10-CM | POA: Diagnosis present

## 2022-07-24 DIAGNOSIS — Z79899 Other long term (current) drug therapy: Secondary | ICD-10-CM

## 2022-07-24 DIAGNOSIS — I10 Essential (primary) hypertension: Secondary | ICD-10-CM | POA: Diagnosis not present

## 2022-07-24 DIAGNOSIS — G8929 Other chronic pain: Secondary | ICD-10-CM | POA: Diagnosis not present

## 2022-07-24 DIAGNOSIS — S32031K Stable burst fracture of third lumbar vertebra, subsequent encounter for fracture with nonunion: Secondary | ICD-10-CM | POA: Diagnosis not present

## 2022-07-24 HISTORY — PX: HARDWARE REMOVAL: SHX979

## 2022-07-24 LAB — CBC
HCT: 48.1 % (ref 39.0–52.0)
Hemoglobin: 16.2 g/dL (ref 13.0–17.0)
MCH: 31.5 pg (ref 26.0–34.0)
MCHC: 33.7 g/dL (ref 30.0–36.0)
MCV: 93.6 fL (ref 80.0–100.0)
Platelets: 230 10*3/uL (ref 150–400)
RBC: 5.14 MIL/uL (ref 4.22–5.81)
RDW: 12.9 % (ref 11.5–15.5)
WBC: 7.9 10*3/uL (ref 4.0–10.5)
nRBC: 0 % (ref 0.0–0.2)

## 2022-07-24 LAB — BASIC METABOLIC PANEL
Anion gap: 10 (ref 5–15)
BUN: 11 mg/dL (ref 6–20)
CO2: 26 mmol/L (ref 22–32)
Calcium: 9.2 mg/dL (ref 8.9–10.3)
Chloride: 100 mmol/L (ref 98–111)
Creatinine, Ser: 1.14 mg/dL (ref 0.61–1.24)
GFR, Estimated: >60 mL/min (ref >60)
Glucose, Bld: 95 mg/dL (ref 70–99)
Potassium: 3.4 mmol/L — ABNORMAL LOW (ref 3.5–5.1)
Sodium: 136 mmol/L (ref 135–145)

## 2022-07-24 SURGERY — REMOVAL, HARDWARE
Anesthesia: General | Site: Back | Laterality: Bilateral

## 2022-07-24 MED ORDER — ACETAMINOPHEN 10 MG/ML IV SOLN
INTRAVENOUS | Status: AC
  Filled 2022-07-24: qty 100

## 2022-07-24 MED ORDER — MIDAZOLAM HCL 2 MG/2ML IJ SOLN
INTRAMUSCULAR | Status: DC | PRN
  Administered 2022-07-24: 2 mg via INTRAVENOUS

## 2022-07-24 MED ORDER — CHLORHEXIDINE GLUCONATE 0.12 % MT SOLN
15.0000 mL | Freq: Once | OROMUCOSAL | Status: AC
  Administered 2022-07-24: 15 mL via OROMUCOSAL
  Filled 2022-07-24: qty 15

## 2022-07-24 MED ORDER — PHENOL 1.4 % MT LIQD: 1.0000 | OROMUCOSAL | Status: DC | PRN

## 2022-07-24 MED ORDER — SODIUM CHLORIDE 0.9 % IV SOLN
250.0000 mL | INTRAVENOUS | Status: DC
  Administered 2022-07-24: 250 mL via INTRAVENOUS

## 2022-07-24 MED ORDER — HYDROCODONE-ACETAMINOPHEN 10-325 MG PO TABS
2.0000 | ORAL_TABLET | ORAL | Status: DC | PRN
  Administered 2022-07-24: 2 via ORAL
  Filled 2022-07-24: qty 2

## 2022-07-24 MED ORDER — ALBUTEROL SULFATE HFA 108 (90 BASE) MCG/ACT IN AERS
INHALATION_SPRAY | RESPIRATORY_TRACT | Status: DC | PRN
  Administered 2022-07-24: 8 via RESPIRATORY_TRACT

## 2022-07-24 MED ORDER — PHENYLEPHRINE 80 MCG/ML (10ML) SYRINGE FOR IV PUSH (FOR BLOOD PRESSURE SUPPORT)
PREFILLED_SYRINGE | INTRAVENOUS | Status: AC
  Filled 2022-07-24: qty 10

## 2022-07-24 MED ORDER — ACETAMINOPHEN 325 MG PO TABS: 650.0000 mg | ORAL_TABLET | ORAL | Status: DC | PRN

## 2022-07-24 MED ORDER — ROCURONIUM BROMIDE 10 MG/ML (PF) SYRINGE
PREFILLED_SYRINGE | INTRAVENOUS | Status: DC | PRN
  Administered 2022-07-24: 50 mg via INTRAVENOUS
  Administered 2022-07-24: 10 mg via INTRAVENOUS

## 2022-07-24 MED ORDER — BACLOFEN 10 MG PO TABS
10.0000 mg | ORAL_TABLET | Freq: Three times a day (TID) | ORAL | Status: DC
  Administered 2022-07-24 – 2022-07-25 (×3): 10 mg via ORAL
  Filled 2022-07-24 (×3): qty 1

## 2022-07-24 MED ORDER — CYCLOBENZAPRINE HCL 10 MG PO TABS
ORAL_TABLET | ORAL | Status: AC
  Filled 2022-07-24: qty 1

## 2022-07-24 MED ORDER — ALBUTEROL SULFATE (2.5 MG/3ML) 0.083% IN NEBU: 3.0000 mL | INHALATION_SOLUTION | Freq: Four times a day (QID) | RESPIRATORY_TRACT | Status: DC | PRN

## 2022-07-24 MED ORDER — PROPOFOL 10 MG/ML IV BOLUS
INTRAVENOUS | Status: AC
  Filled 2022-07-24: qty 20

## 2022-07-24 MED ORDER — ONDANSETRON HCL 4 MG/2ML IJ SOLN: 4.0000 mg | Freq: Four times a day (QID) | INTRAMUSCULAR | Status: DC | PRN

## 2022-07-24 MED ORDER — OXYCODONE HCL 5 MG PO TABS
5.0000 mg | ORAL_TABLET | Freq: Once | ORAL | Status: AC | PRN
  Administered 2022-07-24: 5 mg via ORAL

## 2022-07-24 MED ORDER — HYDROCODONE-ACETAMINOPHEN 10-325 MG PO TABS
1.0000 | ORAL_TABLET | ORAL | Status: DC | PRN
  Administered 2022-07-25: 1 via ORAL
  Filled 2022-07-24 (×2): qty 2

## 2022-07-24 MED ORDER — HYDROMORPHONE HCL 1 MG/ML IJ SOLN: 1.0000 mg | INTRAMUSCULAR | Status: DC | PRN

## 2022-07-24 MED ORDER — CYCLOBENZAPRINE HCL 10 MG PO TABS
10.0000 mg | ORAL_TABLET | Freq: Three times a day (TID) | ORAL | Status: DC | PRN
  Administered 2022-07-24 (×2): 10 mg via ORAL
  Filled 2022-07-24: qty 1

## 2022-07-24 MED ORDER — 0.9 % SODIUM CHLORIDE (POUR BTL) OPTIME
TOPICAL | Status: DC | PRN
  Administered 2022-07-24: 1000 mL

## 2022-07-24 MED ORDER — FENTANYL CITRATE (PF) 250 MCG/5ML IJ SOLN
INTRAMUSCULAR | Status: DC | PRN
  Administered 2022-07-24: 50 ug via INTRAVENOUS
  Administered 2022-07-24: 150 ug via INTRAVENOUS
  Administered 2022-07-24: 50 ug via INTRAVENOUS

## 2022-07-24 MED ORDER — DEXAMETHASONE SODIUM PHOSPHATE 10 MG/ML IJ SOLN
INTRAMUSCULAR | Status: DC | PRN
  Administered 2022-07-24: 10 mg via INTRAVENOUS

## 2022-07-24 MED ORDER — BUPIVACAINE HCL (PF) 0.25 % IJ SOLN
INTRAMUSCULAR | Status: DC | PRN
  Administered 2022-07-24: 20 mL

## 2022-07-24 MED ORDER — LACTATED RINGERS IV SOLN: INTRAVENOUS | Status: DC

## 2022-07-24 MED ORDER — ONDANSETRON HCL 4 MG PO TABS: 4.0000 mg | ORAL_TABLET | Freq: Four times a day (QID) | ORAL | Status: DC | PRN

## 2022-07-24 MED ORDER — FENTANYL CITRATE (PF) 250 MCG/5ML IJ SOLN
INTRAMUSCULAR | Status: AC
  Filled 2022-07-24: qty 5

## 2022-07-24 MED ORDER — PROMETHAZINE HCL 25 MG/ML IJ SOLN: 6.2500 mg | INTRAMUSCULAR | Status: DC | PRN

## 2022-07-24 MED ORDER — CHLORHEXIDINE GLUCONATE CLOTH 2 % EX PADS: 6.0000 | MEDICATED_PAD | Freq: Once | CUTANEOUS | Status: DC

## 2022-07-24 MED ORDER — LIDOCAINE 2% (20 MG/ML) 5 ML SYRINGE
INTRAMUSCULAR | Status: DC | PRN
  Administered 2022-07-24: 60 mg via INTRAVENOUS

## 2022-07-24 MED ORDER — KETOROLAC TROMETHAMINE 15 MG/ML IJ SOLN
30.0000 mg | Freq: Four times a day (QID) | INTRAMUSCULAR | Status: AC
  Administered 2022-07-24 – 2022-07-25 (×3): 30 mg via INTRAVENOUS
  Filled 2022-07-24 (×3): qty 2

## 2022-07-24 MED ORDER — SODIUM CHLORIDE 0.9% FLUSH
3.0000 mL | Freq: Two times a day (BID) | INTRAVENOUS | Status: DC
  Administered 2022-07-24 (×2): 3 mL via INTRAVENOUS

## 2022-07-24 MED ORDER — ONDANSETRON HCL 4 MG/2ML IJ SOLN
INTRAMUSCULAR | Status: DC | PRN
  Administered 2022-07-24: 4 mg via INTRAVENOUS

## 2022-07-24 MED ORDER — MENTHOL 3 MG MT LOZG: 1.0000 | LOZENGE | OROMUCOSAL | Status: DC | PRN

## 2022-07-24 MED ORDER — FENTANYL CITRATE (PF) 100 MCG/2ML IJ SOLN
25.0000 ug | INTRAMUSCULAR | Status: DC | PRN
  Administered 2022-07-24: 50 ug via INTRAVENOUS

## 2022-07-24 MED ORDER — SODIUM CHLORIDE 0.9% FLUSH: 3.0000 mL | INTRAVENOUS | Status: DC | PRN

## 2022-07-24 MED ORDER — THROMBIN 5000 UNITS EX SOLR
CUTANEOUS | Status: AC
  Filled 2022-07-24: qty 10000

## 2022-07-24 MED ORDER — MIDAZOLAM HCL 2 MG/2ML IJ SOLN
INTRAMUSCULAR | Status: AC
  Filled 2022-07-24: qty 2

## 2022-07-24 MED ORDER — THROMBIN (RECOMBINANT) 5000 UNITS EX SOLR: CUTANEOUS | Status: DC | PRN

## 2022-07-24 MED ORDER — KETOROLAC TROMETHAMINE 30 MG/ML IJ SOLN: 30.0000 mg | Freq: Once | INTRAMUSCULAR | Status: DC | PRN

## 2022-07-24 MED ORDER — CEFAZOLIN SODIUM-DEXTROSE 2-4 GM/100ML-% IV SOLN
2.0000 g | INTRAVENOUS | Status: AC
  Administered 2022-07-24: 2 g via INTRAVENOUS
  Filled 2022-07-24: qty 100

## 2022-07-24 MED ORDER — HYDROCODONE-ACETAMINOPHEN 5-325 MG PO TABS: 1.0000 | ORAL_TABLET | ORAL | Status: DC | PRN

## 2022-07-24 MED ORDER — CEFAZOLIN SODIUM-DEXTROSE 1-4 GM/50ML-% IV SOLN
1.0000 g | Freq: Three times a day (TID) | INTRAVENOUS | Status: AC
  Administered 2022-07-24 (×2): 1 g via INTRAVENOUS
  Filled 2022-07-24 (×2): qty 50

## 2022-07-24 MED ORDER — ALBUTEROL SULFATE HFA 108 (90 BASE) MCG/ACT IN AERS
INHALATION_SPRAY | RESPIRATORY_TRACT | Status: AC
  Filled 2022-07-24: qty 6.7

## 2022-07-24 MED ORDER — FENTANYL CITRATE (PF) 100 MCG/2ML IJ SOLN
INTRAMUSCULAR | Status: AC
  Filled 2022-07-24: qty 2

## 2022-07-24 MED ORDER — BUPIVACAINE HCL (PF) 0.25 % IJ SOLN
INTRAMUSCULAR | Status: AC
  Filled 2022-07-24: qty 30

## 2022-07-24 MED ORDER — GABAPENTIN 300 MG PO CAPS
600.0000 mg | ORAL_CAPSULE | Freq: Four times a day (QID) | ORAL | Status: DC
  Administered 2022-07-24 – 2022-07-25 (×4): 600 mg via ORAL
  Filled 2022-07-24 (×5): qty 2

## 2022-07-24 MED ORDER — KETOROLAC TROMETHAMINE 30 MG/ML IJ SOLN
INTRAMUSCULAR | Status: DC | PRN
  Administered 2022-07-24: 30 mg via INTRAVENOUS

## 2022-07-24 MED ORDER — PROPOFOL 10 MG/ML IV BOLUS
INTRAVENOUS | Status: DC | PRN
  Administered 2022-07-24: 150 mg via INTRAVENOUS

## 2022-07-24 MED ORDER — CHLORTHALIDONE 25 MG PO TABS
50.0000 mg | ORAL_TABLET | Freq: Every morning | ORAL | Status: DC
  Administered 2022-07-25: 50 mg via ORAL
  Filled 2022-07-24 (×2): qty 2

## 2022-07-24 MED ORDER — ACETAMINOPHEN 650 MG RE SUPP: 650.0000 mg | RECTAL | Status: DC | PRN

## 2022-07-24 MED ORDER — OXYCODONE HCL 5 MG PO TABS
ORAL_TABLET | ORAL | Status: AC
  Filled 2022-07-24: qty 1

## 2022-07-24 MED ORDER — BACLOFEN 5 MG HALF TABLET
5.0000 mg | ORAL_TABLET | Freq: Three times a day (TID) | ORAL | Status: DC
  Filled 2022-07-24 (×2): qty 2

## 2022-07-24 MED ORDER — ACETAMINOPHEN 10 MG/ML IV SOLN
1000.0000 mg | Freq: Once | INTRAVENOUS | Status: DC | PRN
  Administered 2022-07-24: 1000 mg via INTRAVENOUS

## 2022-07-24 MED ORDER — OXYCODONE HCL 5 MG/5ML PO SOLN: 5.0000 mg | Freq: Once | ORAL | Status: AC | PRN

## 2022-07-24 MED ORDER — AMISULPRIDE (ANTIEMETIC) 5 MG/2ML IV SOLN: 10.0000 mg | Freq: Once | INTRAVENOUS | Status: DC | PRN

## 2022-07-24 MED ORDER — ORAL CARE MOUTH RINSE: 15.0000 mL | Freq: Once | OROMUCOSAL | Status: AC

## 2022-07-24 MED ORDER — SUGAMMADEX SODIUM 200 MG/2ML IV SOLN
INTRAVENOUS | Status: DC | PRN
  Administered 2022-07-24: 200 mg via INTRAVENOUS

## 2022-07-24 SURGICAL SUPPLY — 44 items
ADH SKN CLS APL DERMABOND .7 (GAUZE/BANDAGES/DRESSINGS) ×1
APL SKNCLS STERI-STRIP NONHPOA (GAUZE/BANDAGES/DRESSINGS) ×1
BAG COUNTER SPONGE SURGICOUNT (BAG) ×1 IMPLANT
BAG SPNG CNTER NS LX DISP (BAG) ×1
BENZOIN TINCTURE PRP APPL 2/3 (GAUZE/BANDAGES/DRESSINGS) ×1 IMPLANT
BLADE CLIPPER SURG (BLADE) IMPLANT
BUR CUTTER 7.0 ROUND (BURR) ×1 IMPLANT
CANISTER SUCT 3000ML PPV (MISCELLANEOUS) ×1 IMPLANT
DERMABOND ADVANCED .7 DNX12 (GAUZE/BANDAGES/DRESSINGS) ×1 IMPLANT
DRAPE HALF SHEET 40X57 (DRAPES) IMPLANT
DRAPE LAPAROTOMY 100X72X124 (DRAPES) ×1 IMPLANT
DRAPE MICROSCOPE SLANT 54X150 (MISCELLANEOUS) ×1 IMPLANT
DRAPE SURG 17X23 STRL (DRAPES) ×2 IMPLANT
DRSG OPSITE POSTOP 4X6 (GAUZE/BANDAGES/DRESSINGS) IMPLANT
ELECT REM PT RETURN 9FT ADLT (ELECTROSURGICAL) ×1 IMPLANT
ELECTRODE REM PT RTRN 9FT ADLT (ELECTROSURGICAL) ×1 IMPLANT
GAUZE 4X4 16PLY ~~LOC~~+RFID DBL (SPONGE) IMPLANT
GAUZE SPONGE 4X4 12PLY STRL (GAUZE/BANDAGES/DRESSINGS) ×1 IMPLANT
GLOVE BIO SURGEON STRL SZ 6.5 (GLOVE) ×1 IMPLANT
GLOVE BIOGEL PI IND STRL 6.5 (GLOVE) ×1 IMPLANT
GLOVE ECLIPSE 9.0 STRL (GLOVE) ×1 IMPLANT
GLOVE EXAM NITRILE XL STR (GLOVE) IMPLANT
GOWN STRL REUS W/ TWL LRG LVL3 (GOWN DISPOSABLE) IMPLANT
GOWN STRL REUS W/ TWL XL LVL3 (GOWN DISPOSABLE) ×1 IMPLANT
GOWN STRL REUS W/TWL 2XL LVL3 (GOWN DISPOSABLE) IMPLANT
GOWN STRL REUS W/TWL LRG LVL3 (GOWN DISPOSABLE)
GOWN STRL REUS W/TWL XL LVL3 (GOWN DISPOSABLE) ×1
KIT BASIN OR (CUSTOM PROCEDURE TRAY) ×1 IMPLANT
KIT TURNOVER KIT B (KITS) ×1 IMPLANT
NDL SPNL 22GX3.5 QUINCKE BK (NEEDLE) ×1 IMPLANT
NEEDLE HYPO 22GX1.5 SAFETY (NEEDLE) ×1 IMPLANT
NEEDLE SPNL 22GX3.5 QUINCKE BK (NEEDLE) ×1 IMPLANT
NS IRRIG 1000ML POUR BTL (IV SOLUTION) ×1 IMPLANT
PACK LAMINECTOMY NEURO (CUSTOM PROCEDURE TRAY) ×1 IMPLANT
PAD ARMBOARD 7.5X6 YLW CONV (MISCELLANEOUS) ×3 IMPLANT
RASP HELIOCORDIAL MED (MISCELLANEOUS) IMPLANT
SPIKE FLUID TRANSFER (MISCELLANEOUS) ×1 IMPLANT
SPONGE SURGIFOAM ABS GEL SZ50 (HEMOSTASIS) IMPLANT
STRIP CLOSURE SKIN 1/2X4 (GAUZE/BANDAGES/DRESSINGS) ×1 IMPLANT
SUT VIC AB 2-0 CT1 18 (SUTURE) ×1 IMPLANT
SUT VIC AB 3-0 SH 8-18 (SUTURE) ×1 IMPLANT
TOWEL GREEN STERILE (TOWEL DISPOSABLE) ×1 IMPLANT
TOWEL GREEN STERILE FF (TOWEL DISPOSABLE) ×1 IMPLANT
WATER STERILE IRR 1000ML POUR (IV SOLUTION) ×1 IMPLANT

## 2022-07-24 NOTE — Anesthesia Postprocedure Evaluation (Signed)
Anesthesia Post Note  Patient: Colton Nichols  Procedure(s) Performed: Removal of Bilateral Lumbar Five and Sacral One Pedicle Screws (Bilateral: Back)     Patient location during evaluation: PACU Anesthesia Type: General Level of consciousness: awake Pain management: pain level controlled Vital Signs Assessment: post-procedure vital signs reviewed and stable Respiratory status: spontaneous breathing, nonlabored ventilation and respiratory function stable Cardiovascular status: blood pressure returned to baseline and stable Postop Assessment: no apparent nausea or vomiting Anesthetic complications: no   No notable events documented.  Last Vitals:  Vitals:   07/24/22 1300 07/24/22 1411  BP: (!) 144/98 (!) 162/97  Pulse: 74 65  Resp: 12 20  Temp:  36.7 C  SpO2: 92% 99%    Last Pain:  Vitals:   07/24/22 1230  TempSrc:   PainSc: 3                  Shonta Bourque P Janel Beane

## 2022-07-24 NOTE — Op Note (Signed)
Date of procedure: 07/24/2022  Date of dictation: Same  Service: Neurosurgery  Preoperative diagnosis: Painful, loosened L1 and L5 pedicle screws bilaterally  Postoperative diagnosis: Same  Procedure Name: Reexploration of lumbar fusion with removal of loosened, painful hardware L1 and L5 bilaterally  Surgeon:Earon Rivest A.Jaylah Goodlow, M.D.  Asst. Surgeon: Reinaldo Meeker, NP  Anesthesia: General  Indication: 57 year old male remotely status post L3 burst fracture requiring L1-L5 decompression and fusion surgery.  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  As the segments were not required to be fused for treatment of his fracture but were used as temporary fixation points they may now be safely removed.  Operative note: After induction of anesthesia, patient was positioned prone and appropriately padded.  Lumbar region prepped and draped sterilely.  Localizing fluoroscopy was used.  Incisions were made bilaterally overlying the L1 and L5 pedicle screws.  Dissection was performed through the lumbodorsal fascia and paraspinal muscles exposing the screw heads at L1 and L5 bilaterally.  The caps were removed.  The rods were cut using a metal cutting bit and the rod fragment was removed both at L1 bilaterally and L5 bilaterally.  The screws were then removed bilaterally at L1 and L5.  Wounds were irrigated.  Fascia was reapproximated with 2-0 Vicryl suture.  Skin was reapproximated with Vicryl sutures.  Steri-Strips and sterile dressing were applied.  No apparent complications.  Patient tolerated the procedure well and he returns to the recovery room postop.

## 2022-07-24 NOTE — Brief Op Note (Signed)
07/24/2022  12:16 PM  PATIENT:  Colton Nichols  57 y.o. male  PRE-OPERATIVE DIAGNOSIS:  Painful hardware  POST-OPERATIVE DIAGNOSIS:  Painful hardware  PROCEDURE:  Procedure(s): Removal of Bilateral Lumbar Five and Sacral One Pedicle Screws (Bilateral)  SURGEON:  Surgeon(s) and Role:    Earnie Larsson, MD - Primary  PHYSICIAN ASSISTANT:   ASSISTANTSMearl Latin   ANESTHESIA:   general  EBL:  100cc   BLOOD ADMINISTERED:none  DRAINS: none   LOCAL MEDICATIONS USED:  MARCAINE     SPECIMEN:  No Specimen  DISPOSITION OF SPECIMEN:  N/A  COUNTS:  YES  TOURNIQUET:  * No tourniquets in log *  DICTATION: .Dragon Dictation  PLAN OF CARE: Admit for overnight observation  PATIENT DISPOSITION:  PACU - hemodynamically stable.   Delay start of Pharmacological VTE agent (>24hrs) due to surgical blood loss or risk of bleeding: yes

## 2022-07-24 NOTE — Transfer of Care (Signed)
Immediate Anesthesia Transfer of Care Note  Patient: Colton Nichols  Procedure(s) Performed: Removal of Bilateral Lumbar Five and Sacral One Pedicle Screws (Bilateral: Back)  Patient Location: PACU  Anesthesia Type:General  Level of Consciousness: awake, alert , and oriented  Airway & Oxygen Therapy: Patient Spontanous Breathing  Post-op Assessment: Report given to RN and Post -op Vital signs reviewed and stable  Post vital signs: Reviewed and stable  Last Vitals:  Vitals Value Taken Time  BP 154/101 07/24/22 1232  Temp    Pulse 89 07/24/22 1234  Resp 18 07/24/22 1234  SpO2 99 % 07/24/22 1234  Vitals shown include unvalidated device data.  Last Pain:  Vitals:   07/24/22 0840  TempSrc:   PainSc: 1          Complications: No notable events documented.

## 2022-07-24 NOTE — Anesthesia Procedure Notes (Signed)
Procedure Name: Intubation Date/Time: 07/24/2022 10:44 AM  Performed by: Carolan Clines, CRNAPre-anesthesia Checklist: Patient identified, Emergency Drugs available, Suction available and Patient being monitored Patient Re-evaluated:Patient Re-evaluated prior to induction Oxygen Delivery Method: Circle System Utilized Preoxygenation: Pre-oxygenation with 100% oxygen Induction Type: IV induction Ventilation: Mask ventilation without difficulty Laryngoscope Size: Mac and 4 Grade View: Grade II Tube type: Oral Tube size: 7.5 mm Number of attempts: 1 Airway Equipment and Method: Stylet Placement Confirmation: ETT inserted through vocal cords under direct vision, positive ETCO2 and breath sounds checked- equal and bilateral Secured at: 22 cm Tube secured with: Tape Dental Injury: Teeth and Oropharynx as per pre-operative assessment

## 2022-07-24 NOTE — H&P (Signed)
Colton Nichols is an 57 y.o. male.   Chief Complaint: Back pain HPI: 57 year old male status post L3 burst fracture with incomplete cauda equina injury and chronic back pain with some weakness into his lower extremities.  Patient underwent lumbar decompression and fusion surgery from L1-L5.  He has achieved solid fusion at L2-3 and 4.  Has a loosening of his hardware at L1 and L5 which were segments that were never intended to be fused but is rather fixated.  Plan is to remove the patient's loosened instrumentation at L1 and L5.  Past Medical History:  Diagnosis Date   Asthma    Hypertension     Past Surgical History:  Procedure Laterality Date   LACERATION REPAIR     POSTERIOR LUMBAR FUSION 4 LEVEL N/A 12/19/2019   Procedure: Lumbar Three Decompressive Laminectomy with Transpedicular Decompression, Lumbar one - Lumbar five Posterior Lateral Arthrodesis utilizing segmental pedicle screw fixation and local autografting.;  Surgeon: Earnie Larsson, MD;  Location: [facility];  Service: Neurosurgery;  Laterality: N/A;    Family History  Adopted: Yes   Social History:  reports that he has been smoking cigarettes. He has never used smokeless tobacco. He reports current alcohol use. He reports that he does not use drugs.  Allergies: No Known Allergies  Medications Prior to Admission  Medication Sig Dispense Refill   albuterol (VENTOLIN HFA) 108 (90 Base) MCG/ACT inhaler Inhale 1-2 puffs into the lungs every 6 (six) hours as needed for wheezing or shortness of breath.     Baclofen 5 MG TABS Take 5-10 mg by mouth 3 (three) times daily. 180 tablet 11   chlorthalidone (HYGROTON) 50 MG tablet Take 50 mg by mouth in the morning.     gabapentin (NEURONTIN) 600 MG tablet Take 1 tablet (600 mg total) by mouth 4 (four) times daily. For nerve pain 360 tablet 3   tiZANidine (ZANAFLEX) 4 MG capsule Take 1 capsule (4 mg total) by mouth 3 (three) times daily as needed. 270 capsule 1   triamcinolone cream  (KENALOG) 0.1 % Apply 1 Application topically 2 (two) times daily as needed (skin irritation/wound on back).     mupirocin ointment (BACTROBAN) 2 % Apply 1 application topically 2 (two) times daily. To affected area till better (Patient not taking: Reported on 02/15/2022) 22 g 0   sildenafil (VIAGRA) 100 MG tablet Take 1 tablet (100 mg total) by mouth daily as needed for erectile dysfunction. (Patient not taking: Reported on 07/19/2022) 30 tablet 5    Results for orders placed or performed during the hospital encounter of 07/24/22 (from the past 48 hour(s))  Basic metabolic panel per protocol     Status: Abnormal   Collection Time: 07/24/22  8:49 AM  Result Value Ref Range   Sodium 136 135 - 145 mmol/L   Potassium 3.4 (L) 3.5 - 5.1 mmol/L   Chloride 100 98 - 111 mmol/L   CO2 26 22 - 32 mmol/L   Glucose, Bld 95 70 - 99 mg/dL    Comment: Glucose reference range applies only to samples taken after fasting for at least 8 hours.   BUN 11 6 - 20 mg/dL   Creatinine, Ser 1.14 0.61 - 1.24 mg/dL   Calcium 9.2 8.9 - 10.3 mg/dL   GFR, Estimated >60 >60 mL/min    Comment: (NOTE) Calculated using the CKD-EPI Creatinine Equation (2021)    Anion gap 10 5 - 15    Comment: Performed at [facility]  [address] [place], [place] [address]  CBC per protocol     Status: None   Collection Time: 07/24/22  8:49 AM  Result Value Ref Range   WBC 7.9 4.0 - 10.5 K/uL   RBC 5.14 4.22 - 5.81 MIL/uL   Hemoglobin 16.2 13.0 - 17.0 g/dL   HCT 48.1 39.0 - 52.0 %   MCV 93.6 80.0 - 100.0 fL   MCH 31.5 26.0 - 34.0 pg   MCHC 33.7 30.0 - 36.0 g/dL   RDW 12.9 11.5 - 15.5 %   Platelets 230 150 - 400 K/uL   nRBC 0.0 0.0 - 0.2 %    Comment: Performed at [facility], [place] [address], [place], [place] [address]   No results found.  Pertinent items noted in HPI and remainder of comprehensive ROS otherwise negative.  Blood pressure (!) 146/88, pulse 72, temperature 98.9 F (37.2 C), temperature source  Oral, resp. rate 17, height '6\' 1"'$  (1.854 m), weight 74.8 kg, SpO2 98 %.  Patient is awake and alert.  He is oriented and appropriate.  Speech is fluent.  Judgment insight are intact.  Cranial nerve function normal bilateral.  Motor examination with some mild weakness of dorsal and plantarflexion in both feet.  Gait is antalgic and unsteady.  Examination head ears eyes nose throat is unremarkable her chest and abdomen are benign.  Extremities are free from injury or deformity. Assessment/Plan Painful orthopedic hardware L1 and L5.  Plan removal of hardware.  Risks and benefits been explained.  Patient wishes to proceed.  Mallie Mussel A Genavive Kubicki 07/24/2022, 10:13 AM

## 2022-07-24 NOTE — Anesthesia Preprocedure Evaluation (Addendum)
Anesthesia Evaluation  Patient identified by MRN, date of birth, ID band Patient awake    Reviewed: Allergy & Precautions, NPO status , Patient's Chart, lab work & pertinent test results  Airway Mallampati: II  TM Distance: >3 FB Neck ROM: Full    Dental no notable dental hx.    Pulmonary asthma , Current Smoker and Patient abstained from smoking.   Pulmonary exam normal        Cardiovascular hypertension, Normal cardiovascular exam     Neuro/Psych  Neuromuscular disease  negative psych ROS   GI/Hepatic negative GI ROS, Neg liver ROS,,,  Endo/Other  negative endocrine ROS    Renal/GU negative Renal ROS     Musculoskeletal negative musculoskeletal ROS (+)    Abdominal   Peds  Hematology negative hematology ROS (+)   Anesthesia Other Findings Painful hardware  Reproductive/Obstetrics                             Anesthesia Physical Anesthesia Plan  ASA: 2  Anesthesia Plan: General   Post-op Pain Management:    Induction: Intravenous  PONV Risk Score and Plan: 1 and Ondansetron, Dexamethasone, Midazolam and Treatment may vary due to age or medical condition  Airway Management Planned: Oral ETT  Additional Equipment:   Intra-op Plan:   Post-operative Plan: Extubation in OR  Informed Consent: I have reviewed the patients History and Physical, chart, labs and discussed the procedure including the risks, benefits and alternatives for the proposed anesthesia with the patient or authorized representative who has indicated his/her understanding and acceptance.     Dental advisory given  Plan Discussed with: CRNA  Anesthesia Plan Comments:        Anesthesia Quick Evaluation

## 2022-07-25 ENCOUNTER — Encounter (HOSPITAL_COMMUNITY): Payer: Self-pay | Admitting: Neurosurgery

## 2022-07-25 MED ORDER — CYCLOBENZAPRINE HCL 10 MG PO TABS: 10.0000 mg | ORAL_TABLET | Freq: Three times a day (TID) | ORAL | 0 refills | Status: DC | PRN

## 2022-07-25 MED ORDER — HYDROCODONE-ACETAMINOPHEN 5-325 MG PO TABS: 1.0000 | ORAL_TABLET | ORAL | 0 refills | Status: DC | PRN

## 2022-07-25 MED FILL — Thrombin For Soln 5000 Unit: CUTANEOUS | Qty: 2 | Status: AC

## 2022-07-25 NOTE — Progress Notes (Signed)
Patient alert and oriented, void, ambulate, VSS. Surgical site clean and dry. D/c instructions explain and given to the patient. Pt. D/c home per order.

## 2022-07-25 NOTE — Discharge Summary (Signed)
Physician Discharge Summary  Patient ID: Colton Nichols MRN: [id number] DOB/AGE: September 09, 1965 57 y.o.  Admit date: 07/24/2022 Discharge date: 07/25/2022  Admission Diagnoses:  Discharge Diagnoses:  Principal Problem:   Painful orthopaedic hardware [facility])   Discharged Condition: good  Hospital Course: Patient admitted to the hospital where he underwent uncomplicated reexploration of his lumbar fusion with removal of his L1 and L5 pedicle screws.  Postoperatively doing well.  Back pain well-controlled.  Standing ambulating and voiding without difficulty.  Ready for discharge home.  Consults:   Significant Diagnostic Studies:   Treatments:   Discharge Exam: Blood pressure 133/81, pulse 87, temperature 98.4 F (36.9 C), temperature source Oral, resp. rate 16, height '6\' 1"'$  (1.854 m), weight 74.8 kg, SpO2 98 %. Awake and alert.  Oriented and appropriate.  Motor and sensory function intact.  Wound clean and dry.  Chest and abdomen benign.  Disposition: Discharge disposition: 01-Home or Self Care        Allergies as of 07/25/2022   No Known Allergies      Medication List     TAKE these medications    albuterol 108 (90 Base) MCG/ACT inhaler Commonly known as: VENTOLIN HFA Inhale 1-2 puffs into the lungs every 6 (six) hours as needed for wheezing or shortness of breath.   Baclofen 5 MG Tabs Take 5-10 mg by mouth 3 (three) times daily.   chlorthalidone 50 MG tablet Commonly known as: HYGROTON Take 50 mg by mouth in the morning.   cyclobenzaprine 10 MG tablet Commonly known as: FLEXERIL Take 1 tablet (10 mg total) by mouth 3 (three) times daily as needed for muscle spasms.   gabapentin 600 MG tablet Commonly known as: Neurontin Take 1 tablet (600 mg total) by mouth 4 (four) times daily. For nerve pain   HYDROcodone-acetaminophen 5-325 MG tablet Commonly known as: NORCO/VICODIN Take 1 tablet by mouth every 4 (four) hours as needed for moderate pain ((score 4 to 6)).    mupirocin ointment 2 % Commonly known as: BACTROBAN Apply 1 application topically 2 (two) times daily. To affected area till better   sildenafil 100 MG tablet Commonly known as: Viagra Take 1 tablet (100 mg total) by mouth daily as needed for erectile dysfunction.   tiZANidine 4 MG capsule Commonly known as: ZANAFLEX Take 1 capsule (4 mg total) by mouth 3 (three) times daily as needed.   triamcinolone cream 0.1 % Commonly known as: KENALOG Apply 1 Application topically 2 (two) times daily as needed (skin irritation/wound on back).         Signed: [name] 07/25/2022, 8:29 AM

## 2022-07-25 NOTE — Plan of Care (Signed)

## 2022-07-25 NOTE — Progress Notes (Signed)
Occupational Therapy Treatment Patient Details Name: Colton Nichols MRN: [id number] DOB: 01/17/1966 Today's Date: 07/25/2022   History of present illness Colton Nichols is a 57 yo male who underwent removal of Bilateral Lumbar Five and Sacral One Pedicle Screws. PMHx:  L3 burst fracture requiring L1-L5 decompression and fusion surgery, asthma, HTN   OT comments  Ahmet was evaluated s/p the above spine surgery. He is mod I with use of DME at baseline. Upon evaluation he was limited by back pain, precautions and activity tolerance. Overall he required generalized supervision A for ADLs and mobility with SPC. Provided cues and education on spinal precautions and compensatory techniques throughout, handout provided and pt demonstrated good recall. Pt does not require further acute OT services. Recommend d/c home with support of family.     Recommendations for follow up therapy are one component of a multi-disciplinary discharge planning process, led by the attending physician.  Recommendations may be updated based on patient status, additional functional criteria and insurance authorization.    Follow Up Recommendations  No OT follow up     Assistance Recommended at Discharge Intermittent Supervision/Assistance  Patient can return home with the following  A little help with walking and/or transfers;A little help with bathing/dressing/bathroom;Assist for transportation   Equipment Recommendations  Other (comment) (SPC)       Precautions / Restrictions Precautions Precautions: Fall;Back Precaution Booklet Issued: Yes (comment) Restrictions Weight Bearing Restrictions: No       Mobility Bed Mobility Overal bed mobility: Needs Assistance             General bed mobility comments: sitting UPON arrival    Transfers Overall transfer level: Needs assistance Equipment used: Straight cane Transfers: Sit to/from Stand Sit to Stand: Supervision                 Balance  Overall balance assessment: Needs assistance Sitting-balance support: Feet supported Sitting balance-Leahy Scale: Good     Standing balance support: No upper extremity supported, During functional activity Standing balance-Leahy Scale: Fair                             ADL either performed or assessed with clinical judgement   ADL Overall ADL's : Needs assistance/impaired Eating/Feeding: Independent   Grooming: Supervision/safety;Sitting   Upper Body Bathing: Set up;Sitting   Lower Body Bathing: Supervison/ safety;Sit to/from stand   Upper Body Dressing : Set up;Sitting   Lower Body Dressing: Supervision/safety;Sit to/from stand   Toilet Transfer: Supervision/safety;Ambulation Toilet Transfer Details (indicate cue type and reason): SPC Toileting- Clothing Manipulation and Hygiene: Supervision/safety;Sitting/lateral lean       Functional mobility during ADLs: Supervision/safety;Cane General ADL Comments: cues for back precautions    Extremity/Trunk Assessment Upper Extremity Assessment Upper Extremity Assessment: Overall WFL for tasks assessed   Lower Extremity Assessment Lower Extremity Assessment: Overall WFL for tasks assessed   Cervical / Trunk Assessment Cervical / Trunk Assessment: Back Surgery    Vision Baseline Vision/History: 1 Wears glasses Vision Assessment?: No apparent visual deficits          Cognition Arousal/Alertness: Awake/alert Behavior During Therapy: WFL for tasks assessed/performed Overall Cognitive Status: Within Functional Limits for tasks assessed                      General Comments VSS on RA    Pertinent Vitals/ Pain       Pain Assessment Pain Assessment: Faces Faces Pain Scale:  Hurts little more Pain Location: back Pain Descriptors / Indicators: Discomfort, Grimacing, Guarding Pain Intervention(s): Limited activity within patient's tolerance, Monitored during session  Home Living Family/patient expects to  be discharged to:: Private residence Living Arrangements: Parent Available Help at Discharge: Family;Available 24 hours/day Type of Home: House Home Access: Stairs to enter [employer] of Steps: 2 Entrance Stairs-Rails: Right;Left Home Layout: One level     Bathroom Shower/Tub: Teacher, early years/pre: Standard     Home Equipment: Conservation officer, nature (2 wheels);BSC/3in1;Shower seat           Progress Toward Goals  OT Goals(current goals can now be found in the care plan section)     Acute Rehab OT Goals Patient Stated Goal: home OT Goal Formulation: With patient Time For Goal Achievement: 07/25/22 Potential to Achieve Goals: Good  Plan         AM-PAC OT "6 Clicks" Daily Activity     Outcome Measure   Help from another person eating meals?: None Help from another person taking care of personal grooming?: A Little Help from another person toileting, which includes using toliet, bedpan, or urinal?: A Little Help from another person bathing (including washing, rinsing, drying)?: A Little Help from another person to put on and taking off regular upper body clothing?: None Help from another person to put on and taking off regular lower body clothing?: A Little 6 Click Score: 20    End of Session    OT Visit Diagnosis: Unsteadiness on feet (R26.81);Other abnormalities of gait and mobility (R26.89);Muscle weakness (generalized) (M62.81);Pain   Activity Tolerance Patient tolerated treatment well   Patient Left in bed;with call bell/phone within reach   Nurse Communication Mobility status        Time: [id number] OT Time Calculation (min): 11 min  Charges: OT General Charges $OT Visit: 1 Visit OT Evaluation $OT Eval Low Complexity: [place], OTR/L [facility] [place] Communication Preferred  Elliot Cousin 07/25/2022, 10:21 AM

## 2022-07-25 NOTE — Discharge Instructions (Signed)

## 2022-07-26 ENCOUNTER — Telehealth: Payer: Self-pay

## 2022-07-26 NOTE — Transitions of Care (Post Inpatient/ED Visit) (Signed)
   07/26/2022  Name: Colton Nichols MRN: [id number] DOB: 1965-07-25  Today's TOC FU Call Status: Today's TOC FU Call Status:: Successful TOC FU Call Competed TOC FU Call Complete Date: 07/26/22  Transition Care Management Follow-up Telephone Call Date of Discharge: 07/25/22 [facility]) Type of Discharge: Inpatient Admission Primary Inpatient Discharge Diagnosis:: "removal of bilateral lumbar five and sacral one pedicle screws" How have you been since you were released from the hospital?: Better (patient reports he is doing well-pain is fairly controlled/managed-he has a regimen rhat he is using to help.He has been up walking and moving around-headed to pharmacy with his mom to pick up meds) Any questions or concerns?: No  Items Reviewed: Did you receive and understand the discharge instructions provided?: Yes Medications obtained and verified?: Yes (Medications Reviewed) Any new allergies since your discharge?: No Dietary orders reviewed?: NA Do you have support at home?: Yes People in Home: parent(s) Name of Support/Comfort Primary Source: [place] and Equipment/Supplies: [place] Ordered?: NA Any new equipment or medical supplies ordered?: NA  Functional Questionnaire: Do you need assistance with bathing/showering or dressing?: No Do you need assistance with meal preparation?: No Do you need assistance with eating?: No Do you have difficulty maintaining continence: No Do you need assistance with getting out of bed/getting out of a chair/moving?: No Do you have difficulty managing or taking your medications?: No  Folllow up appointments reviewed: PCP Follow-up appointment confirmed?: Yes Date of PCP follow-up appointment?: 08/23/22 (pt changing to new provider and has appt already scheduled) Follow-up Provider: [facility] Follow-up appointment confirmed?: No Reason Specialist Follow-Up Not  Confirmed: Patient has Specialist Provider Number and will Call for Appointment (he will call surgeon office to make follow up appt) Do you need transportation to your follow-up appointment?: No Do you understand care options if your condition(s) worsen?: Yes-patient verbalized understanding  SDOH Interventions Today    Flowsheet Row Most Recent Value  SDOH Interventions   Food Insecurity Interventions Intervention Not Indicated  Transportation Interventions Intervention Not Indicated      TOC Interventions Today    Flowsheet Row Most Recent Value  TOC Interventions   TOC Interventions Discussed/Reviewed TOC Interventions Discussed, Post discharge activity limitations per provider, Post op wound/incision care, S/S of infection      Interventions Today    Flowsheet Row Most Recent Value  Education Interventions   Education Provided Provided Education  [pain mgmt, bowel regimen]  Provided Verbal Education On Nutrition, Medication, When to see the doctor  Nutrition Interventions   Nutrition Discussed/Reviewed Nutrition Discussed  Pharmacy Interventions   Pharmacy Dicussed/Reviewed Pharmacy Topics Discussed, Medications and their functions  Safety Interventions   Safety Discussed/Reviewed Safety Discussed, Fall Risk       Hetty Blend [facility] Care Management Care Management Community Coordinator Direct Phone: [phone] Toll Free: [phone] Fax: [phone]

## 2022-08-01 ENCOUNTER — Encounter: Payer: Self-pay | Admitting: Physical Medicine and Rehabilitation

## 2022-08-01 ENCOUNTER — Encounter: Payer: Medicare HMO | Attending: Physical Medicine and Rehabilitation | Admitting: Physical Medicine and Rehabilitation

## 2022-08-01 VITALS — BP 140/89 | HR 98 | Ht 73.0 in | Wt 165.8 lb

## 2022-08-01 DIAGNOSIS — G9581 Conus medullaris syndrome: Secondary | ICD-10-CM | POA: Diagnosis not present

## 2022-08-01 DIAGNOSIS — M792 Neuralgia and neuritis, unspecified: Secondary | ICD-10-CM

## 2022-08-01 DIAGNOSIS — N521 Erectile dysfunction due to diseases classified elsewhere: Secondary | ICD-10-CM | POA: Diagnosis not present

## 2022-08-01 MED ORDER — BACLOFEN 5 MG PO TABS: 5.0000 mg | ORAL_TABLET | Freq: Three times a day (TID) | ORAL | 3 refills | Status: DC

## 2022-08-01 MED ORDER — SILDENAFIL CITRATE 100 MG PO TABS: 100.0000 mg | ORAL_TABLET | Freq: Every day | ORAL | 5 refills | Status: DC | PRN

## 2022-08-01 MED ORDER — TIZANIDINE HCL 4 MG PO CAPS: 4.0000 mg | ORAL_CAPSULE | Freq: Three times a day (TID) | ORAL | 3 refills | Status: DC | PRN

## 2022-08-01 NOTE — Patient Instructions (Signed)
Pt is a 57 yr old male with HTN, nerve pain, Cauda Equina syndrome- due to   L3 burst fx s/p lami/decompresison  (7/21) here for f/u on cauda equina syndrome.  Here for f/u on cauda equina but I think it's conus medullaris somehow, since has some spasticity-  and chronic pain.  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     removal of B/L pedicle screws by Dr Annette Stable x4 screws.   Can continue Norco as needed per Dr Annette Stable   2. Don't take Flexeril/Cyclobenzaprine while taking Baclofen and Tizanidine- because they could make you too sleepy.   3. Will refill Baclofen to take 5 mg TID and an extra pill daily as needed for muscle spasms  4. Needs more Viagra- will prescribe- 100 mg daily as needed- 20 minutes before intimacy- if insurance doesn't cover- at [place], use a good Rx card- and should be no more than $20-  5. Refill Zanaflex/Tizanidine 4 mg 3x/day as needed- for muscle spasms/tightness   6. 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   7. Continue gabapentin- doesn't need today

## 2022-08-01 NOTE — Progress Notes (Signed)
Subjective:    Patient ID: Colton Nichols, male    DOB: [birth year], 57 y.o.   MRN: [id number]  HPI  Pt is a 57 yr old male with HTN, nerve pain, Cauda Equina syndrome- due to   L3 burst fx s/p lami/decompresison  (7/21) here for f/u on cauda equina syndrome.  Here for f/u on cauda equina and chronic pain.  Also has erectile dysfunction due ot SCI; no Neurogneic bowel and bladder.   S/P removal of B/L L5/S1 pedicle screws-x4 screws    Insurance lapsed- since got more disability- now has [employer]- And card for partial medicaid  Dr Annette Stable felt he needed to remove screws from back  last Thursday and went home Friday- went OK  Took off RW- now on single point cane!!!! Last week.  Walking ell- getting used to it still. Catching self trying to grab RW, but then sees has cane.   In house no Assistive device- doesn't even furniture walk in the house!  Can walk - can walk a [employer], so can walk pretty far.   Doesn't take Tramadol since hadn't seen me- taking gabapentin, BP med and Zanaflex and Baclofen.  Wasn't taking the Zanaflex and Baclofen until insurance   Has 3 months of gabapentin.  Doesn't take tylenol or ibuprofen  Pain in general, doing better- since last year,  Main pain where took screws out- got Rx for Norco- 2x/day right now and maybe 3rd time during day.   Also taking Flexeril -      Pain Inventory Average Pain 7 Pain Right Now 0 My pain is sharp, stabbing, tingling, and aching  In the last 24 hours, has pain interfered with the following? General activity 0 Relation with others 7 Enjoyment of life 6 What TIME of day is your pain at its worst? morning  and evening Sleep (in general) Fair  Pain is worse with: sitting and standing Pain improves with: therapy/exercise, pacing activities, and medication Relief from Meds:  na  Family History  Adopted: Yes   Social History   Socioeconomic History   Marital status: Single    Spouse name: Not on  file   Number of children: Not on file   Years of education: Not on file   Highest education level: Not on file  Occupational History   Not on file  Tobacco Use   Smoking status: Some Days    Types: Cigarettes   Smokeless tobacco: Never  Vaping Use   Vaping Use: Never used  Substance and Sexual Activity   Alcohol use: Yes    Comment: 1-2 cans beer per week   Drug use: No   Sexual activity: Not on file  Other Topics Concern   Not on file  Social History Narrative   Not on file   Social Determinants of Health   Financial Resource Strain: Not on file  Food Insecurity: No Food Insecurity (07/26/2022)   Hunger Vital Sign    Worried About Running Out of Food in the Last Year: Never true    Ran Out of Food in the Last Year: Never true  Transportation Needs: No Transportation Needs (07/26/2022)   PRAPARE - Hydrologist (Medical): No    Lack of Transportation (Non-Medical): No  Physical Activity: Not on file  Stress: Not on file  Social Connections: Not on file   Past Surgical History:  Procedure Laterality Date   HARDWARE REMOVAL Bilateral 07/24/2022   Procedure: Removal of Bilateral  Lumbar Five and Sacral One Pedicle Screws;  Surgeon: Earnie Larsson, MD;  Location: [place];  Service: Neurosurgery;  Laterality: Bilateral;   LACERATION REPAIR     POSTERIOR LUMBAR FUSION 4 LEVEL N/A 12/19/2019   Procedure: Lumbar Three Decompressive Laminectomy with Transpedicular Decompression, Lumbar one - Lumbar five Posterior Lateral Arthrodesis utilizing segmental pedicle screw fixation and local autografting.;  Surgeon: Earnie Larsson, MD;  Location: [place];  Service: Neurosurgery;  Laterality: N/A;   Past Surgical History:  Procedure Laterality Date   HARDWARE REMOVAL Bilateral 07/24/2022   Procedure: Removal of Bilateral Lumbar Five and Sacral One Pedicle Screws;  Surgeon: Earnie Larsson, MD;  Location: [place];  Service: Neurosurgery;  Laterality: Bilateral;   LACERATION REPAIR      POSTERIOR LUMBAR FUSION 4 LEVEL N/A 12/19/2019   Procedure: Lumbar Three Decompressive Laminectomy with Transpedicular Decompression, Lumbar one - Lumbar five Posterior Lateral Arthrodesis utilizing segmental pedicle screw fixation and local autografting.;  Surgeon: Earnie Larsson, MD;  Location: [place];  Service: Neurosurgery;  Laterality: N/A;   Past Medical History:  Diagnosis Date   Asthma    Hypertension    BP (!) 140/89   Pulse 98   Ht '6\' 1"'$  (1.854 m)   Wt 165 lb 12.8 oz (75.2 kg)   SpO2 96%   BMI 21.87 kg/m   Opioid Risk Score:   Fall Risk Score:  `1  Depression screen PHQ 2/9     08/01/2022   10:00 AM 09/06/2021    9:09 AM 05/03/2021    8:57 AM 09/28/2020    8:11 AM 07/11/2020   12:44 PM 03/14/2020    9:39 AM 01/18/2020    9:43 AM  Depression screen PHQ 2/9  Decreased Interest 0 0 0 0 0 1 0  Down, Depressed, Hopeless 0 0 0 0 0 1 0  PHQ - 2 Score 0 0 0 0 0 2 0  Altered sleeping       0  Tired, decreased energy       1  Change in appetite       0  Feeling bad or failure about yourself        0  Trouble concentrating       0  Moving slowly or fidgety/restless       0  Suicidal thoughts       0  PHQ-9 Score       1  Difficult doing work/chores       Not difficult at all     Review of Systems  Constitutional: Negative.   HENT: Negative.    Eyes: Negative.   Respiratory: Negative.    Cardiovascular: Negative.   Gastrointestinal: Negative.   Endocrine: Negative.   Genitourinary: Negative.   Musculoskeletal:  Positive for back pain and gait problem.       Just had 4 screws removed by Dr Trenton Gammon Thursday 07/26/22  Skin: Negative.   Allergic/Immunologic: Negative.   Hematological: Negative.   Psychiatric/Behavioral: Negative.    All other systems reviewed and are negative.      Objective:   Physical Exam  Awake, alert, appropriate, NAD Using cane to walk MS: HF 5/5; KE 5-/5 and DF/PF 4+/5- much better  Neuro: No clonus, but has mild MAS of 1 in LE's- and pt  c/o stiffness as well  Gait: slightly hesitant with gait, and foot slap very slightly with prolonged gait  Skin_ has original surgical dressing in place     Assessment & Plan:  Pt is a 57 yr old male with HTN, nerve pain, Cauda Equina syndrome- due to   L3 burst fx s/p lami/decompresison  (7/21) here for f/u on cauda equina syndrome.  Here for f/u on cauda equina but I think it's conus medullaris somehow, since has some spasticity-  and chronic pain.  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     removal of B/L pedicle screws by Dr Annette Stable x4 screws.   Can continue Norco as needed per Dr Annette Stable   2. Don't take Flexeril/Cyclobenzaprine while taking Baclofen and Tizanidine- because they could make you too sleepy.   3. Will refill Baclofen to take 5 mg TID and an extra pill daily as needed for muscle spasms  4. Needs more Viagra- will prescribe- 100 mg daily as needed- 20 minutes before intimacy- if insurance doesn't cover- at [facility], use a good Rx card- and should be no more than $20-  5. Refill Zanaflex/Tizanidine 4 mg 3x/day as needed- for muscle spasms/tightness   6. 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   7. Continue gabapentin- doesn't need today  I spent a total of    23 minutes on total care today- >50% coordination of care- due to discussing need for pain/gabapentin, and spasms- and discussing how he's doing since I saw almost 1 year ago

## 2022-08-15 ENCOUNTER — Telehealth: Payer: Self-pay | Admitting: Physical Medicine and Rehabilitation

## 2022-08-15 ENCOUNTER — Encounter: Payer: Self-pay | Admitting: *Deleted

## 2022-08-15 NOTE — Telephone Encounter (Signed)
Message sent to patient informing him of Dr Dagoberto Ligas being out of the office until next week and she will reply at that time.

## 2022-08-15 NOTE — Telephone Encounter (Signed)
Patient received notification from [facility] that they do not cover baclofen. He is asking what to do next.

## 2022-08-20 MED ORDER — BACLOFEN 10 MG PO TABS: 5.0000 mg | ORAL_TABLET | Freq: Three times a day (TID) | ORAL | 5 refills | Status: DC

## 2022-08-20 NOTE — Telephone Encounter (Signed)
LVM Baclofen 5 mg sent.

## 2022-08-23 ENCOUNTER — Ambulatory Visit: Payer: Medicare HMO | Admitting: Nurse Practitioner

## 2022-11-15 ENCOUNTER — Telehealth: Payer: Self-pay

## 2022-11-15 NOTE — Telephone Encounter (Signed)
Per patient he now has Medicaid and he is out of medications. His next appointment is on 11/26/2022.

## 2022-11-16 MED ORDER — SILDENAFIL CITRATE 100 MG PO TABS: 100.0000 mg | ORAL_TABLET | Freq: Every day | ORAL | 5 refills | Status: AC | PRN

## 2022-11-16 MED ORDER — TIZANIDINE HCL 4 MG PO CAPS: 4.0000 mg | ORAL_CAPSULE | Freq: Three times a day (TID) | ORAL | 3 refills | Status: DC | PRN

## 2022-11-16 MED ORDER — BACLOFEN 10 MG PO TABS: 5.0000 mg | ORAL_TABLET | Freq: Three times a day (TID) | ORAL | 5 refills | Status: DC

## 2022-11-16 MED ORDER — HYDROCODONE-ACETAMINOPHEN 5-325 MG PO TABS: 1.0000 | ORAL_TABLET | ORAL | 0 refills | Status: DC | PRN

## 2022-11-16 NOTE — Telephone Encounter (Signed)
LV detailed message as outlined by Dr. Berline Chough.

## 2022-11-19 ENCOUNTER — Other Ambulatory Visit: Payer: Self-pay | Admitting: Physical Medicine and Rehabilitation

## 2022-11-26 ENCOUNTER — Encounter: Payer: Medicare HMO | Attending: Physical Medicine and Rehabilitation | Admitting: Physical Medicine and Rehabilitation

## 2022-11-26 DIAGNOSIS — Z5181 Encounter for therapeutic drug level monitoring: Secondary | ICD-10-CM | POA: Insufficient documentation

## 2022-11-26 DIAGNOSIS — Z79891 Long term (current) use of opiate analgesic: Secondary | ICD-10-CM | POA: Insufficient documentation

## 2022-11-26 DIAGNOSIS — G894 Chronic pain syndrome: Secondary | ICD-10-CM | POA: Insufficient documentation

## 2022-12-14 ENCOUNTER — Encounter: Payer: Medicare HMO | Admitting: Physical Medicine and Rehabilitation

## 2022-12-17 ENCOUNTER — Encounter (HOSPITAL_BASED_OUTPATIENT_CLINIC_OR_DEPARTMENT_OTHER): Payer: Medicare HMO | Admitting: Physical Medicine and Rehabilitation

## 2022-12-17 ENCOUNTER — Encounter: Payer: Self-pay | Admitting: Physical Medicine and Rehabilitation

## 2022-12-17 VITALS — BP 149/89 | HR 74 | Ht 73.0 in | Wt 138.0 lb

## 2022-12-17 DIAGNOSIS — Z79891 Long term (current) use of opiate analgesic: Secondary | ICD-10-CM

## 2022-12-17 DIAGNOSIS — G894 Chronic pain syndrome: Secondary | ICD-10-CM

## 2022-12-17 DIAGNOSIS — Z5181 Encounter for therapeutic drug level monitoring: Secondary | ICD-10-CM | POA: Diagnosis not present

## 2022-12-17 MED ORDER — BACLOFEN 10 MG PO TABS: 5.0000 mg | ORAL_TABLET | Freq: Three times a day (TID) | ORAL | 3 refills | Status: DC

## 2022-12-17 MED ORDER — GABAPENTIN 600 MG PO TABS: 600.0000 mg | ORAL_TABLET | Freq: Four times a day (QID) | ORAL | 3 refills | Status: DC

## 2022-12-17 MED ORDER — HYDROCODONE-ACETAMINOPHEN 5-325 MG PO TABS: 1.0000 | ORAL_TABLET | Freq: Four times a day (QID) | ORAL | 0 refills | Status: DC | PRN

## 2022-12-17 MED ORDER — PREDNISONE 20 MG PO TABS: 40.0000 mg | ORAL_TABLET | Freq: Every day | ORAL | 0 refills | Status: AC

## 2022-12-17 NOTE — Progress Notes (Signed)
Subjective:    Patient ID: Colton Nichols, male    DOB: November 12, 1965, 57 y.o.   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  Also has erectile dysfunction due ot SCI; no Neurogneic bowel and bladder.   S/P removal of B/L L5/S1 pedicle screws-x4 screws   Nerves acting crazy-  Started 7-10 days ago.  Thinks it's from last 4 screws to come out Numbness and tingling.   Supposed to see Dr Jordan Likes next month- to discuss taking more screws out.   Thinks it's from screws pushing themselves out.  Feels like they are moving and healing/pushing screws out.   Still taking Zanaflex and gabapentin Needs refill of Baclofen.  Needs refill of Norco today as well- has 1 pill left.   Has used Viagra- works-  Shouldn't need refills right now.   Pain Inventory Average Pain 5 Pain Right Now 6 My pain is intermittent, constant, sharp, tingling, and aching  In the last 24 hours, has pain interfered with the following? General activity 4 Relation with others 6 Enjoyment of life 6 What TIME of day is your pain at its worst? daytime and evening Sleep (in general) Fair  Pain is worse with: standing Pain improves with: heat/ice and medication Relief from Meds: 5  Family History  Adopted: Yes   Social History   Socioeconomic History   Marital status: Single    Spouse name: Not on file   Number of children: Not on file   Years of education: Not on file   Highest education level: Not on file  Occupational History   Not on file  Tobacco Use   Smoking status: Some Days    Types: Cigarettes   Smokeless tobacco: Never  Vaping Use   Vaping status: Never Used  Substance and Sexual Activity   Alcohol use: Yes    Comment: 1-2 cans beer per week   Drug use: No   Sexual activity: Not on file  Other Topics Concern   Not on file  Social History  Narrative   Not on file   Social Determinants of Health   Financial Resource Strain: Not on file  Food Insecurity: No Food Insecurity (07/26/2022)   Hunger Vital Sign    Worried About Running Out of Food in the Last Year: Never true    Ran Out of Food in the Last Year: Never true  Transportation Needs: No Transportation Needs (07/26/2022)   PRAPARE - Administrator, Civil Service (Medical): No    Lack of Transportation (Non-Medical): No  Physical Activity: Not on file  Stress: Not on file  Social Connections: Not on file   Past Surgical History:  Procedure Laterality Date   HARDWARE REMOVAL Bilateral 07/24/2022   Procedure: Removal of Bilateral Lumbar Five and Sacral One Pedicle Screws;  Surgeon: Julio Sicks, MD;  Location: [facility] OR;  Service: Neurosurgery;  Laterality: Bilateral;   LACERATION REPAIR     POSTERIOR LUMBAR FUSION 4 LEVEL N/A 12/19/2019   Procedure: Lumbar Three Decompressive Laminectomy with Transpedicular Decompression, Lumbar one - Lumbar five Posterior Lateral Arthrodesis utilizing segmental pedicle screw fixation and local autografting.;  Surgeon: Julio Sicks, MD;  Location: [facility] OR;  Service: Neurosurgery;  Laterality: N/A;   Past Surgical History:  Procedure Laterality Date   HARDWARE REMOVAL Bilateral 07/24/2022  Procedure: Removal of Bilateral Lumbar Five and Sacral One Pedicle Screws;  Surgeon: Julio Sicks, MD;  Location: [facility] OR;  Service: Neurosurgery;  Laterality: Bilateral;   LACERATION REPAIR     POSTERIOR LUMBAR FUSION 4 LEVEL N/A 12/19/2019   Procedure: Lumbar Three Decompressive Laminectomy with Transpedicular Decompression, Lumbar one - Lumbar five Posterior Lateral Arthrodesis utilizing segmental pedicle screw fixation and local autografting.;  Surgeon: Julio Sicks, MD;  Location: [facility] OR;  Service: Neurosurgery;  Laterality: N/A;   Past Medical History:  Diagnosis Date   Asthma    Hypertension    BP (!) 149/89   Pulse 74   Ht 6\' 1"  (1.854 m)   Wt  138 lb (62.6 kg)   SpO2 98%   BMI 18.21 kg/m   Opioid Risk Score:   Fall Risk Score:  `1  Depression screen [facility] 2/9     12/17/2022   11:03 AM 08/01/2022   10:00 AM 09/06/2021    9:09 AM 05/03/2021    8:57 AM 09/28/2020    8:11 AM 07/11/2020   12:44 PM 03/14/2020    9:39 AM  Depression screen PHQ 2/9  Decreased Interest 0 0 0 0 0 0 1  Down, Depressed, Hopeless 0 0 0 0 0 0 1  PHQ - 2 Score 0 0 0 0 0 0 2     Review of Systems  Musculoskeletal:  Positive for back pain.       B/L leg pain  All other systems reviewed and are negative.      Objective:   Physical Exam  Awake, alert, appropriate, birthday!, NAD MS: 5/5 in LE's except for R HF 4+/5 Trigger points in B/L lumbar paraspinals- (says can be hard as a rock sometimes)  Neuro: No increased tone on exam today No clonus.         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  Here 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  Also has erectile dysfunction due ot SCI; no Neurogneic bowel and bladder.   S/P removal of B/L L5/S1 pedicle screws-x4 screws  Get refills from pharmacy for  Viagra- has refills ordered  2. Sent in refill for Norco - is due- has 1 pill left. Sent in for 50 tabs  3. Sent in refills for Baclofen and gabapentin to [facility] since has moved, so don't want ot take a risk going to wrong location.    4. Has refills for Zanaflex- doesn't need today.    5.  UDS- due for urine drug screen. Is due today per clinic policy.   6. Stand at counter and do knee bends-  start 2 legs- and can build up to 1 legged knee bends- at counter!!!  7. F/U in 3 months- since on pain meds   8. Prednisone 40 mg daily x 5 days- for calming down nerve pain. For irritation/inflammation. Can make you sleep less for 5-6 days and to help nerve pain  I spent a total of  34  minutes on total care today- >50% coordination  of care- due to  D/w pt about UDS as well as pain meds- and d/w about spasticity meds- and screws in back -dsicsusion of pain and how to get controlled

## 2022-12-17 NOTE — Patient Instructions (Addendum)
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  Here 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  Also has erectile dysfunction due ot SCI; no Neurogneic bowel and bladder.   S/P removal of B/L L5/S1 pedicle screws-x4 screws  Get refills from pharmacy for  Viagra- has refills ordered  2. Sent in refill for Norco - is due- has 1 pill left. Sent in for 50 tabs  3. Sent in refills for Baclofen and gabapentin to [facility] since has moved, so don't want ot take a risk going to wrong location.    4. Has refills for Zanaflex- doesn't need today.    5.  UDS- due for urine drug screen. Is due today per clinic policy.   6. Stand at counter and do knee bends-  start 2 legs- and can build up to 1 legged knee bends- at counter!!!  7. F/U in 3 months- since on pain meds  8. Prednisone 40 mg daily x 5 days- for calming down nerve pain. For irritation/inflammation. Can make you sleep less for 5-6 days and to help nerve pain

## 2023-02-01 ENCOUNTER — Ambulatory Visit: Payer: Medicaid Other | Admitting: Physical Medicine and Rehabilitation

## 2023-02-13 DIAGNOSIS — S32032A Unstable burst fracture of third lumbar vertebra, initial encounter for closed fracture: Secondary | ICD-10-CM | POA: Diagnosis not present

## 2023-02-13 DIAGNOSIS — T8484XA Pain due to internal orthopedic prosthetic devices, implants and grafts, initial encounter: Secondary | ICD-10-CM | POA: Diagnosis not present

## 2023-02-26 ENCOUNTER — Ambulatory Visit (HOSPITAL_COMMUNITY)
Admission: EM | Admit: 2023-02-26 | Discharge: 2023-02-26 | Disposition: A | Discharge status: Home / Self Care | Payer: Medicare PPO | Attending: Nurse Practitioner | Admitting: Nurse Practitioner

## 2023-02-26 DIAGNOSIS — R413 Other amnesia: Secondary | ICD-10-CM | POA: Diagnosis not present

## 2023-02-26 DIAGNOSIS — Z7689 Persons encountering health services in other specified circumstances: Secondary | ICD-10-CM

## 2023-02-26 NOTE — ED Provider Notes (Signed)
Behavioral Health Urgent Care Medical Screening Exam  Patient Name: Colton Nichols MRN: [id number] Date of Evaluation: 02/26/23 Chief Complaint:  memory loss Diagnosis:  Final diagnoses:  Encounter for psychiatric assessment    History of Present illness: Colton Nichols is a 57 y.o. male. patient presented to [facility] as a walk in voluntarily accompanied by himself with complaints of "brain farts" and memory loss.   Colton Nichols, 57 y.o., male patient seen face to face by this provider, consulted with Dr. Viviano Simas; and chart reviewed on 02/26/23.  On evaluation Colton Nichols reports that he has been having some "brain farts" and trouble with his memory for the past 4 months. Pt stated he will be in the middle of a conversation and will forget his train of thought. Shortly after he will remember and continue the conversation. Pt denied ever forgetting where he is or his surroundings during these episodes. Pt denies family history of dementia or alzheimer's. Pt denies any psychiatric history, has never taken psychotropic medications. Denies problems with anxiety/depression. He has no psychiatric complaints at this time. He does endorse daily THC use, and occasional alcohol use. Pt will socially have 1 or 2 beers a few times a week. I did encourage patient to discontinue THC use as this likely would not help with any brain fog he is experiencing. However patient stated he has been using THC daily since the age of 29 and has no intentions to stop. He denies any other substance use.   Pt denies SI. Denies any hx of suicide attempts. Denies HI. Denies AVH. Denies problems with sleep or appetite. He lives alone in [place], he does have a lot of friend and family support. Ultimately, he wants to make sure that him forgetting things isn't a psychiatric disorder. During my assessment, patient was able to stay on topic, and never appeared to have a "brain fart." After further discussion, would be  best for patient to obtain PCP to start following up with to ensure his "memory loss" does not worsen. Pt agrees, he does not have a PCP currently. Will leave resources to establish PCP locally.  During evaluation Colton Nichols is sitting in no acute distress.  He is alert, oriented x 4, calm, cooperative and attentive.  His mood is euthymic with congruent affect.  He has normal speech, and behavior.  Objectively there is no evidence of psychosis/mania or delusional thinking.  Patient is able to converse coherently, goal directed thoughts, no distractibility, or pre-occupation. He also denies suicidal/self-harm/homicidal ideation, psychosis, and paranoia.  Patient answered question appropriately. At this time, patient does not have a psychiatric complaint or emergency, and can be discharged. Pt needs to establish PCP care for concerns of memory loss.      Flowsheet Row ED from 02/26/2023 in [facility] Admission (Discharged) from 07/24/2022 in [facility] ED from 07/12/2021 in [facility] RISK CATEGORY No Risk No Risk No Risk       Psychiatric Specialty Exam  Presentation  General Appearance:Appropriate for Environment  Eye Contact:Good  Speech:Clear and Coherent  Speech Volume:Normal  Handedness:No data recorded  Mood and Affect  Mood: Euthymic  Affect: Congruent   Thought Process  Thought Processes: Coherent  Descriptions of Associations:Intact  Orientation:Full (Time, Place and Person)  Thought Content:WDL    Hallucinations:None  Ideas of Reference:None  Suicidal Thoughts:No  Homicidal Thoughts:No   Sensorium  Memory:  Immediate Good; Recent Good  Judgment: Good  Insight: Good   Executive Functions  Concentration: Good  Attention Span: Good  Recall: Good  Fund of Knowledge: Good  Language: Good   Psychomotor Activity  Psychomotor  Activity: Normal   Assets  Assets: Desire for Improvement; Communication Skills; Housing; Social Support   Sleep  Sleep: Good  Number of hours: No data recorded  Physical Exam: Physical Exam Neurological:     Mental Status: He is alert and oriented to person, place, and time.  Psychiatric:        Attention and Perception: Attention normal.        Mood and Affect: Mood normal.        Speech: Speech normal.        Behavior: Behavior is cooperative.        Thought Content: Thought content normal.    Review of Systems  Psychiatric/Behavioral:  Positive for memory loss.   All other systems reviewed and are negative.  Blood pressure (!) 159/103, pulse 72, temperature 98.2 F (36.8 C), temperature source Oral, resp. rate 18, SpO2 100%. There is no height or weight on file to calculate BMI.  Musculoskeletal: Strength & Muscle Tone: within normal limits Gait & Station: normal Patient leans: N/A   [facility] MSE Discharge Disposition for Follow up and Recommendations: Based on my evaluation the patient does not appear to have an emergency medical condition and can be discharged with resources and follow up care in outpatient services for follow up with PCP to establish OP care  Patient does not need psychiatric services at this time, recommended patient to start follow up with PCP due to concerns of memory loss. Resources have been provided in AVS.   Eligha Bridegroom, NP 02/26/2023, 2:57 PM

## 2023-02-26 NOTE — Discharge Instructions (Addendum)
Please call to schedule PCP appointment

## 2023-02-26 NOTE — Progress Notes (Signed)
   02/26/23 1133  [facility] Triage Screening (Walk-ins at [facility] only)  How Did You Hear About [place]? Self  What Is the Reason for Your Visit/Call Today? Pt presents to [facility] voluntarily unaccompanied by anyone. Pt states that he is have brain farts, meaning he may forget in the middle of doing something such as playing cards. Pt states that it has been going on for about a month now and that his friends all noticed it when they would be talking.  How Long Has This Been Causing You Problems? 1 wk - 1 month  Have You Recently Had Any Thoughts About Hurting Yourself? No  Are You Planning to Commit Suicide/Harm Yourself At This time? No  Have you Recently Had Thoughts About Hurting Someone Karolee Ohs? No  Are You Planning To Harm Someone At This Time? No  Are you currently experiencing any auditory, visual or other hallucinations? No  Have You Used Any Alcohol or Drugs in the Past 24 Hours? No  Do you have any current medical co-morbidities that require immediate attention? No  Clinician description of patient physical appearance/behavior: neatly dressed, calm, cooperative  What Do You Feel Would Help You the Most Today? Social Support  If access to [facility] was not available, would you have sought care in the Emergency Department? No  Determination of Need Routine (7 days)  Options For Referral Medication Management;Outpatient Therapy

## 2023-03-18 ENCOUNTER — Encounter: Payer: Medicare PPO | Attending: Physical Medicine and Rehabilitation | Admitting: Physical Medicine and Rehabilitation

## 2024-01-01 ENCOUNTER — Encounter: Payer: Self-pay | Admitting: Physical Medicine and Rehabilitation

## 2024-01-01 ENCOUNTER — Encounter: Attending: Physical Medicine and Rehabilitation | Admitting: Physical Medicine and Rehabilitation

## 2024-01-01 VITALS — BP 146/84 | HR 88 | Ht 73.0 in | Wt 138.0 lb

## 2024-01-01 DIAGNOSIS — R252 Cramp and spasm: Secondary | ICD-10-CM | POA: Insufficient documentation

## 2024-01-01 DIAGNOSIS — S32001D Stable burst fracture of unspecified lumbar vertebra, subsequent encounter for fracture with routine healing: Secondary | ICD-10-CM | POA: Diagnosis present

## 2024-01-01 DIAGNOSIS — G9581 Conus medullaris syndrome: Secondary | ICD-10-CM | POA: Insufficient documentation

## 2024-01-01 DIAGNOSIS — Z79891 Long term (current) use of opiate analgesic: Secondary | ICD-10-CM | POA: Insufficient documentation

## 2024-01-01 DIAGNOSIS — Z5181 Encounter for therapeutic drug level monitoring: Secondary | ICD-10-CM | POA: Insufficient documentation

## 2024-01-01 DIAGNOSIS — G894 Chronic pain syndrome: Secondary | ICD-10-CM | POA: Diagnosis not present

## 2024-01-01 DIAGNOSIS — S33141D Dislocation of L4/L5 lumbar vertebra, subsequent encounter: Secondary | ICD-10-CM | POA: Diagnosis present

## 2024-01-01 MED ORDER — HYDROCODONE-ACETAMINOPHEN 5-325 MG PO TABS: 1.0000 | ORAL_TABLET | Freq: Four times a day (QID) | ORAL | 0 refills | Status: DC | PRN

## 2024-01-01 MED ORDER — GABAPENTIN 600 MG PO TABS: 600.0000 mg | ORAL_TABLET | Freq: Four times a day (QID) | ORAL | 3 refills | Status: DC

## 2024-01-01 MED ORDER — BACLOFEN 10 MG PO TABS: 10.0000 mg | ORAL_TABLET | Freq: Three times a day (TID) | ORAL | 3 refills | Status: AC

## 2024-01-01 MED ORDER — TIZANIDINE HCL 4 MG PO CAPS: 4.0000 mg | ORAL_CAPSULE | Freq: Three times a day (TID) | ORAL | 3 refills | Status: DC

## 2024-01-01 NOTE — Patient Instructions (Signed)
 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  Also has erectile dysfunction due ot SCI; no Neurogneic bowel and bladder.   S/P removal of B/L L5/S1 pedicle screws-x4 screws  Toxassure plus Oral drug screen needed to get him back on pain meds-   2. Write for Norco 5/325 mg up to 4x/day as needed for pain #50-last rx was written for this 1 year ago.   3. 1st- restart with gabapentin  600 mg nightly x 5 days, then 600 mg 2x/day x 5 days, then 3x/day for 5 days and can increase if need be to 600 mg 4x/day (or 600/600/1200 mg at night)  4. 3-4 days after starting Gabapentin , start Baclofen  5 mg  (1/2 tab) 3x/day for spasticity- then increase to 10 mg 3x/day after 1 week.   5. Then 3-4 days later, add Tizanidine  4 mg 2-3x/day- be slow in adding meds- can knock on your butt!  6. F/U in  3 months since on low dose of Norco- f/u with me on SCI- conus medullaris and chronic pain
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
## 2024-01-01 NOTE — Addendum Note (Signed)
 Addended by: GEORGINA BARI CROME on: 01/01/2024 11:53 AM   Modules accepted: Orders

## 2024-01-02 ENCOUNTER — Telehealth: Payer: Self-pay

## 2024-01-02 NOTE — Telephone Encounter (Signed)
 PA for Hydrocodone  5-325 submitted in Cover My Med.

## 2024-01-02 NOTE — Telephone Encounter (Signed)
 PA for Tizanidine  4MG  submitted in Cover My Meds

## 2024-01-03 ENCOUNTER — Other Ambulatory Visit: Payer: Self-pay | Admitting: Physical Medicine and Rehabilitation

## 2024-01-03 MED ORDER — TIZANIDINE HCL 4 MG PO TABS: 4.0000 mg | ORAL_TABLET | Freq: Three times a day (TID) | ORAL | 5 refills | Status: AC

## 2024-01-03 NOTE — Telephone Encounter (Signed)
 Tizanidine   4 MG  capsule is not covered. Per [facility] the 4 MG tablet is covered. Are you willing to change the form?  Please advise. Thank you.

## 2024-01-06 LAB — DRUG TOX MONITOR 1 W/CONF, ORAL FLD
Amphetamines: NEGATIVE ng/mL (ref <10)
Barbiturates: NEGATIVE ng/mL (ref <10)
Benzodiazepines: NEGATIVE ng/mL (ref <0.50)
Buprenorphine: NEGATIVE ng/mL (ref <0.10)
Cocaine: NEGATIVE ng/mL (ref <5.0)
Cotinine: >250 ng/mL — ABNORMAL HIGH (ref <5.0)
Fentanyl: NEGATIVE ng/mL (ref <0.10)
Heroin Metabolite: NEGATIVE ng/mL (ref <1.0)
MARIJUANA: NEGATIVE ng/mL (ref <2.5)
MDMA: NEGATIVE ng/mL (ref <10)
Meprobamate: NEGATIVE ng/mL (ref <2.5)
Methadone: NEGATIVE ng/mL (ref <5.0)
Nicotine Metabolite: POSITIVE ng/mL — AB (ref <5.0)
Opiates: NEGATIVE ng/mL (ref <2.5)
Phencyclidine: NEGATIVE ng/mL (ref <10)
Tapentadol: NEGATIVE ng/mL (ref <5.0)
Tramadol: NEGATIVE ng/mL (ref <5.0)
Zolpidem: NEGATIVE ng/mL (ref <5.0)

## 2024-01-06 LAB — DRUG TOX ALC METAB W/CON, ORAL FLD: Alcohol Metabolite: NEGATIVE ng/mL (ref <25)

## 2024-01-06 MED ORDER — HYDROCODONE-ACETAMINOPHEN 5-325 MG PO TABS: 1.0000 | ORAL_TABLET | Freq: Four times a day (QID) | ORAL | 0 refills | Status: AC | PRN

## 2024-01-06 NOTE — Telephone Encounter (Signed)
 Patient has picked up the 5 day supply of Hydrocodone  5-325 MG. Per Pharmacy you will need to send a new script for the remainder.  Please send to [facility] on [address].   Filled  Written  ID  Drug  QTY  Days  Prescriber  RX #  Dispenser  Refill  Daily Dose*  Pymt Type  PMP  01/04/2024 01/01/2024 1  Hydrocodone -Acetamin 5-325 Mg 20.00 5 Me Lov [id number] [facility] (2001) 0/0 20.00 MME Medicare [place] 01/02/2024 01/01/2024 1  Gabapentin  600 Mg Tablet 360.00 90 Me Lov [id number] [facility] (2001) 0/3  Medicare [place] 07/25/2022 07/25/2022 1  Hydrocodone -Acetamin 5-325 Mg 30.00 5 He Poo [id number] [facility] (2001) 0/0 30.00 MME Medicare [place]

## 2024-01-06 NOTE — Addendum Note (Signed)
 Addended by: Danyla Wattley on: 01/06/2024 04:26 PM   Modules accepted: Orders

## 2024-01-06 NOTE — Addendum Note (Signed)
 Addended by: Shonya Sumida M on: 01/06/2024 09:44 AM   Modules accepted: Orders

## 2024-05-11 ENCOUNTER — Emergency Department (HOSPITAL_COMMUNITY)
Admission: EM | Admit: 2024-05-11 | Discharge: 2024-05-11 | Disposition: A | Discharge status: Home / Self Care | Attending: Emergency Medicine | Admitting: Emergency Medicine

## 2024-05-11 ENCOUNTER — Encounter (HOSPITAL_COMMUNITY): Payer: Self-pay

## 2024-05-11 ENCOUNTER — Other Ambulatory Visit: Payer: Self-pay

## 2024-05-11 DIAGNOSIS — M545 Low back pain, unspecified: Secondary | ICD-10-CM | POA: Diagnosis present

## 2024-05-11 DIAGNOSIS — M5442 Lumbago with sciatica, left side: Secondary | ICD-10-CM | POA: Diagnosis not present

## 2024-05-11 DIAGNOSIS — M5441 Lumbago with sciatica, right side: Secondary | ICD-10-CM | POA: Insufficient documentation

## 2024-05-11 MED ORDER — CYCLOBENZAPRINE HCL 10 MG PO TABS: 5.0000 mg | ORAL_TABLET | Freq: Two times a day (BID) | ORAL | 0 refills | Status: DC | PRN

## 2024-05-11 MED ORDER — LIDOCAINE 5 % EX PTCH
2.0000 | MEDICATED_PATCH | CUTANEOUS | Status: DC
  Administered 2024-05-11: 2 via TRANSDERMAL
  Filled 2024-05-11: qty 2

## 2024-05-11 MED ORDER — CYCLOBENZAPRINE HCL 10 MG PO TABS: 5.0000 mg | ORAL_TABLET | Freq: Two times a day (BID) | ORAL | 0 refills | Status: AC | PRN

## 2024-05-11 MED ORDER — CYCLOBENZAPRINE HCL 10 MG PO TABS
5.0000 mg | ORAL_TABLET | Freq: Once | ORAL | Status: AC
  Administered 2024-05-11: 5 mg via ORAL
  Filled 2024-05-11: qty 1

## 2024-05-11 MED ORDER — ACETAMINOPHEN 500 MG PO TABS
1000.0000 mg | ORAL_TABLET | Freq: Once | ORAL | Status: AC
  Administered 2024-05-11: 1000 mg via ORAL
  Filled 2024-05-11: qty 2

## 2024-05-11 MED ADMIN — Ketorolac Tromethamine Inj 30 MG/ML: 30 mg | INTRAMUSCULAR | NDC 68462075601

## 2024-05-11 MED FILL — Ketorolac Tromethamine Inj 30 MG/ML: 30.0000 mg | INTRAMUSCULAR | Qty: 1 | Status: AC
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
## 2024-05-11 NOTE — Discharge Instructions (Signed)
 [facility]  Thank you for allowing us  to take care of you today.  You came to the Emergency Department today because you are having worsening of your back pain as electrical color.  Here in the emergency department your exam was reassuring, and you are feeling better after getting Toradol , Tylenol , lidocaine  patches and Flexeril .  Flexeril  is a muscle relaxer, we will prescribe this medication to you.  You can use it twice a day as needed for back pain.  You can also use Tylenol  and ibuprofen  every 4-6 hours as needed.  Additionally we recommend using over-the-counter lidocaine  patches.  All of these medications only help a little bit on their own, however you if you take them together they can provide much more relief.  The most important thing for improving your pain long-term is to do the exercises on the attachment.  Doing these regularly will build up strength and flexibility and decrease your pain long-term.   To-Do: 1. Please follow-up with your primary doctor within 1 month / as soon as possible.   Please return to the Emergency Department or call 911 if you experience have worsening of your symptoms, or do not get better, numbness around your anus, bladder accidents, chest pain, shortness of breath, severe or significantly worsening pain, high fever, severe confusion, pass out or have any reason to think that you need emergency medical care.   We hope you feel better soon.   Mitzie Later, MD Department of Emergency Medicine [facility]

## 2024-05-11 NOTE — ED Triage Notes (Signed)
 Lower back pain that started last night. Pt had back surgery about 7-10 years ago and states he thinks the cold weather is flaring up his pain. Pain is radiating down both legs. No issues with incontinence.

## 2024-05-15 ENCOUNTER — Other Ambulatory Visit: Payer: Self-pay | Admitting: Physical Medicine and Rehabilitation
